# Patient Record
Sex: Male | Born: 1950 | Race: Black or African American | Hispanic: No | Marital: Married | Smoking: Former smoker
Health system: Southern US, Community
[De-identification: ages and names within clinical notes are randomized; demographics above are authoritative.]

## PROBLEM LIST (undated history)

## (undated) DIAGNOSIS — I739 Peripheral vascular disease, unspecified: Secondary | ICD-10-CM

## (undated) DIAGNOSIS — I2510 Atherosclerotic heart disease of native coronary artery without angina pectoris: Secondary | ICD-10-CM

## (undated) DIAGNOSIS — I10 Essential (primary) hypertension: Secondary | ICD-10-CM

## (undated) DIAGNOSIS — C61 Malignant neoplasm of prostate: Secondary | ICD-10-CM

## (undated) DIAGNOSIS — R3912 Poor urinary stream: Secondary | ICD-10-CM

## (undated) DIAGNOSIS — R0602 Shortness of breath: Secondary | ICD-10-CM

## (undated) DIAGNOSIS — Z951 Presence of aortocoronary bypass graft: Secondary | ICD-10-CM

## (undated) DIAGNOSIS — Z808 Family history of malignant neoplasm of other organs or systems: Secondary | ICD-10-CM

## (undated) DIAGNOSIS — N401 Enlarged prostate with lower urinary tract symptoms: Secondary | ICD-10-CM

## (undated) DIAGNOSIS — Z95820 Peripheral vascular angioplasty status with implants and grafts: Secondary | ICD-10-CM

## (undated) DIAGNOSIS — I639 Cerebral infarction, unspecified: Secondary | ICD-10-CM

## (undated) DIAGNOSIS — Z973 Presence of spectacles and contact lenses: Secondary | ICD-10-CM

## (undated) DIAGNOSIS — E119 Type 2 diabetes mellitus without complications: Secondary | ICD-10-CM

## (undated) DIAGNOSIS — Z803 Family history of malignant neoplasm of breast: Secondary | ICD-10-CM

## (undated) DIAGNOSIS — J449 Chronic obstructive pulmonary disease, unspecified: Secondary | ICD-10-CM

## (undated) DIAGNOSIS — E785 Hyperlipidemia, unspecified: Secondary | ICD-10-CM

## (undated) DIAGNOSIS — I779 Disorder of arteries and arterioles, unspecified: Secondary | ICD-10-CM

## (undated) DIAGNOSIS — M1990 Unspecified osteoarthritis, unspecified site: Secondary | ICD-10-CM

## (undated) DIAGNOSIS — K219 Gastro-esophageal reflux disease without esophagitis: Secondary | ICD-10-CM

## (undated) HISTORY — DX: Family history of malignant neoplasm of breast: Z80.3

## (undated) HISTORY — PX: COLONOSCOPY: SHX174

## (undated) HISTORY — PX: CARDIOVASCULAR STRESS TEST: SHX262

## (undated) HISTORY — PX: ENDOVASCULAR STENT INSERTION: SHX5161

## (undated) HISTORY — DX: Peripheral vascular disease, unspecified: I73.9

## (undated) HISTORY — DX: Disorder of arteries and arterioles, unspecified: I77.9

## (undated) HISTORY — PX: CAROTID ENDARTERECTOMY: SUR193

## (undated) HISTORY — PX: CARDIAC CATHETERIZATION: SHX172

## (undated) HISTORY — DX: Atherosclerotic heart disease of native coronary artery without angina pectoris: I25.10

## (undated) HISTORY — DX: Family history of malignant neoplasm of other organs or systems: Z80.8

## (undated) HISTORY — DX: Essential (primary) hypertension: I10

---

## 2000-12-01 ENCOUNTER — Encounter: Payer: Self-pay | Admitting: Cardiovascular Disease

## 2000-12-01 ENCOUNTER — Ambulatory Visit (HOSPITAL_COMMUNITY): Admission: RE | Admit: 2000-12-01 | Discharge: 2000-12-01 | Payer: Self-pay | Admitting: Cardiovascular Disease

## 2000-12-25 ENCOUNTER — Ambulatory Visit (HOSPITAL_COMMUNITY): Admission: RE | Admit: 2000-12-25 | Discharge: 2000-12-26 | Payer: Self-pay | Admitting: Cardiovascular Disease

## 2001-03-02 ENCOUNTER — Ambulatory Visit (HOSPITAL_COMMUNITY): Admission: RE | Admit: 2001-03-02 | Discharge: 2001-03-03 | Payer: Self-pay | Admitting: Cardiovascular Disease

## 2001-09-24 ENCOUNTER — Ambulatory Visit (HOSPITAL_COMMUNITY): Admission: RE | Admit: 2001-09-24 | Discharge: 2001-09-24 | Payer: Self-pay | Admitting: Gastroenterology

## 2001-11-10 ENCOUNTER — Encounter: Payer: Self-pay | Admitting: Cardiovascular Disease

## 2001-11-10 ENCOUNTER — Ambulatory Visit (HOSPITAL_COMMUNITY): Admission: RE | Admit: 2001-11-10 | Discharge: 2001-11-11 | Payer: Self-pay | Admitting: Cardiovascular Disease

## 2003-04-30 ENCOUNTER — Emergency Department (HOSPITAL_COMMUNITY): Admission: EM | Admit: 2003-04-30 | Discharge: 2003-04-30 | Payer: Self-pay | Admitting: Emergency Medicine

## 2003-08-25 ENCOUNTER — Encounter: Admission: RE | Admit: 2003-08-25 | Discharge: 2003-08-25 | Payer: Self-pay | Admitting: Cardiovascular Disease

## 2003-08-30 ENCOUNTER — Ambulatory Visit (HOSPITAL_COMMUNITY): Admission: RE | Admit: 2003-08-30 | Discharge: 2003-08-31 | Payer: Self-pay | Admitting: Cardiovascular Disease

## 2009-02-22 ENCOUNTER — Ambulatory Visit: Payer: Self-pay | Admitting: Cardiology

## 2009-02-22 ENCOUNTER — Inpatient Hospital Stay (HOSPITAL_COMMUNITY): Admission: EM | Admit: 2009-02-22 | Discharge: 2009-02-24 | Payer: Self-pay | Admitting: Emergency Medicine

## 2009-02-22 DIAGNOSIS — I639 Cerebral infarction, unspecified: Secondary | ICD-10-CM

## 2009-02-22 HISTORY — DX: Cerebral infarction, unspecified: I63.9

## 2009-02-23 ENCOUNTER — Ambulatory Visit: Payer: Self-pay | Admitting: Vascular Surgery

## 2009-02-24 ENCOUNTER — Ambulatory Visit: Payer: Self-pay | Admitting: Vascular Surgery

## 2009-02-24 ENCOUNTER — Encounter (INDEPENDENT_AMBULATORY_CARE_PROVIDER_SITE_OTHER): Payer: Self-pay | Admitting: Emergency Medicine

## 2009-02-24 ENCOUNTER — Encounter: Payer: Self-pay | Admitting: Vascular Surgery

## 2009-02-28 ENCOUNTER — Inpatient Hospital Stay (HOSPITAL_COMMUNITY): Admission: AD | Admit: 2009-02-28 | Discharge: 2009-03-02 | Payer: Self-pay | Admitting: Vascular Surgery

## 2009-02-28 ENCOUNTER — Encounter: Payer: Self-pay | Admitting: Vascular Surgery

## 2009-03-23 ENCOUNTER — Ambulatory Visit: Payer: Self-pay | Admitting: Vascular Surgery

## 2009-11-02 ENCOUNTER — Ambulatory Visit: Payer: Self-pay | Admitting: Vascular Surgery

## 2010-05-24 ENCOUNTER — Ambulatory Visit: Admit: 2010-05-24 | Payer: Self-pay | Admitting: Vascular Surgery

## 2010-05-24 ENCOUNTER — Other Ambulatory Visit (INDEPENDENT_AMBULATORY_CARE_PROVIDER_SITE_OTHER): Payer: PRIVATE HEALTH INSURANCE

## 2010-05-24 DIAGNOSIS — I6529 Occlusion and stenosis of unspecified carotid artery: Secondary | ICD-10-CM

## 2010-06-01 NOTE — Procedures (Unsigned)
CAROTID DUPLEX EXAM  INDICATION:  Carotid disease.  HISTORY: Diabetes:  No Cardiac:  No Hypertension:  Yes Smoking:  Previous Previous Surgery:  Right carotid endarterectomy on 02/28/2009. CV History:  History of stroke prior to surgery. Amaurosis Fugax No, Paresthesias No, Hemiparesis No                                      RIGHT             LEFT Brachial systolic pressure:         120               124 Brachial Doppler waveforms:         normal            normal Vertebral direction of flow:        antegrade         antegrade DUPLEX VELOCITIES (cm/sec) CCA peak systolic                   113               94 ECA peak systolic                   107               330 ICA peak systolic                   P=80/M=137        263 ICA end diastolic                   P=21/M=39         56 PLAQUE MORPHOLOGY:                                    heterogeneous PLAQUE AMOUNT:                      none              moderate PLAQUE LOCATION:                                      ICA/ECA/distal CCA  IMPRESSION: 1. Patent right carotid endarterectomy site with no evidence of     stenosis.  Elevated velocities noted in the right mid internal     carotid artery with no plaque formation noted. 2. Doppler velocities suggest high-end 40% to 59% stenosis of the left     proximal internal carotid artery. 3. Left external carotid artery stenosis. 4. Doppler velocities of the left internal carotid artery appear less     than previously reported when compared to the previous exam on     11/02/2009 with the right internal carotid artery remaining stable.  ___________________________________________ Di Kindle. Edilia Bo, M.D.  CH/MEDQ  D:  05/24/2010  T:  05/24/2010  Job:  [id number]

## 2010-07-25 LAB — URINALYSIS, ROUTINE W REFLEX MICROSCOPIC
Bilirubin Urine: NEGATIVE
Hgb urine dipstick: NEGATIVE
Ketones, ur: NEGATIVE mg/dL
Ketones, ur: NEGATIVE mg/dL
Nitrite: NEGATIVE
Nitrite: NEGATIVE
Protein, ur: NEGATIVE mg/dL
Specific Gravity, Urine: 1.011 (ref 1.005–1.030)
Specific Gravity, Urine: 1.022 (ref 1.005–1.030)
Urobilinogen, UA: 1 mg/dL (ref 0.0–1.0)
pH: 6.5 (ref 5.0–8.0)

## 2010-07-25 LAB — TROPONIN I
Troponin I: 0.01 ng/mL (ref 0.00–0.06)
Troponin I: 0.01 ng/mL (ref 0.00–0.06)

## 2010-07-25 LAB — COMPREHENSIVE METABOLIC PANEL
ALT: 17 U/L (ref 0–53)
ALT: 18 U/L (ref 0–53)
AST: 26 U/L (ref 0–37)
Albumin: 3.7 g/dL (ref 3.5–5.2)
Alkaline Phosphatase: 39 U/L (ref 39–117)
Alkaline Phosphatase: 46 U/L (ref 39–117)
BUN: 9 mg/dL (ref 6–23)
CO2: 29 mEq/L (ref 19–32)
Calcium: 8.7 mg/dL (ref 8.4–10.5)
Calcium: 9 mg/dL (ref 8.4–10.5)
Chloride: 104 mEq/L (ref 96–112)
Creatinine, Ser: 0.94 mg/dL (ref 0.4–1.5)
Creatinine, Ser: 1.02 mg/dL (ref 0.4–1.5)
GFR calc Af Amer: >60 mL/min (ref >60)
GFR calc non Af Amer: >60 mL/min (ref >60)
Glucose, Bld: 111 mg/dL — ABNORMAL HIGH (ref 70–99)
Glucose, Bld: 124 mg/dL — ABNORMAL HIGH (ref 70–99)
Glucose, Bld: 130 mg/dL — ABNORMAL HIGH (ref 70–99)
Potassium: 3.7 mEq/L (ref 3.5–5.1)
Potassium: 4.9 mEq/L (ref 3.5–5.1)
Sodium: 135 mEq/L (ref 135–145)
Sodium: 139 mEq/L (ref 135–145)
Total Bilirubin: 0.6 mg/dL (ref 0.3–1.2)
Total Protein: 6 g/dL (ref 6.0–8.3)
Total Protein: 6.8 g/dL (ref 6.0–8.3)

## 2010-07-25 LAB — DIFFERENTIAL
Basophils Absolute: 0 10*3/uL (ref 0.0–0.1)
Eosinophils Absolute: 0.3 10*3/uL (ref 0.0–0.7)
Eosinophils Relative: 5 % (ref 0–5)
Lymphocytes Relative: 29 % (ref 12–46)
Lymphocytes Relative: 34 % (ref 12–46)
Lymphs Abs: 2.1 10*3/uL (ref 0.7–4.0)
Lymphs Abs: 2.2 10*3/uL (ref 0.7–4.0)
Monocytes Absolute: 0.6 10*3/uL (ref 0.1–1.0)
Monocytes Relative: 10 % (ref 3–12)
Monocytes Relative: 9 % (ref 3–12)
Neutro Abs: 3.2 10*3/uL (ref 1.7–7.7)
Neutrophils Relative %: 56 % (ref 43–77)

## 2010-07-25 LAB — PROTIME-INR
INR: 0.89 (ref 0.00–1.49)
INR: 0.97 (ref 0.00–1.49)
INR: 1.04 (ref 0.00–1.49)
Prothrombin Time: 12.8 seconds (ref 11.6–15.2)

## 2010-07-25 LAB — CBC
HCT: 35.8 % — ABNORMAL LOW (ref 39.0–52.0)
HCT: 39.7 % (ref 39.0–52.0)
HCT: 41.6 % (ref 39.0–52.0)
Hemoglobin: 13.1 g/dL (ref 13.0–17.0)
Hemoglobin: 14 g/dL (ref 13.0–17.0)
MCV: 91.9 fL (ref 78.0–100.0)
MCV: 92.4 fL (ref 78.0–100.0)
Platelets: 164 10*3/uL (ref 150–400)
RBC: 4.31 MIL/uL (ref 4.22–5.81)
RBC: 4.4 MIL/uL (ref 4.22–5.81)
RDW: 12.8 % (ref 11.5–15.5)
WBC: 4.5 10*3/uL (ref 4.0–10.5)
WBC: 6.2 10*3/uL (ref 4.0–10.5)
WBC: 7.5 10*3/uL (ref 4.0–10.5)

## 2010-07-25 LAB — CK TOTAL AND CKMB (NOT AT ARMC)
CK, MB: 1.9 ng/mL (ref 0.3–4.0)
CK, MB: 2.9 ng/mL (ref 0.3–4.0)
Relative Index: 1.5 (ref 0.0–2.5)
Total CK: 128 U/L (ref 7–232)
Total CK: 155 U/L (ref 7–232)

## 2010-07-25 LAB — TYPE AND SCREEN: ABO/RH(D): A POS

## 2010-07-25 LAB — RAPID URINE DRUG SCREEN, HOSP PERFORMED
Amphetamines: NOT DETECTED
Opiates: POSITIVE — AB
Tetrahydrocannabinol: POSITIVE — AB

## 2010-07-25 LAB — GLUCOSE, CAPILLARY: Glucose-Capillary: 116 mg/dL — ABNORMAL HIGH (ref 70–99)

## 2010-07-25 LAB — BASIC METABOLIC PANEL
BUN: 5 mg/dL — ABNORMAL LOW (ref 6–23)
Creatinine, Ser: 0.83 mg/dL (ref 0.4–1.5)
GFR calc non Af Amer: >60 mL/min (ref >60)
Glucose, Bld: 146 mg/dL — ABNORMAL HIGH (ref 70–99)
Potassium: 4.1 mEq/L (ref 3.5–5.1)

## 2010-07-25 LAB — LIPID PANEL: VLDL: 20 mg/dL (ref 0–40)

## 2010-07-25 LAB — HEMOGLOBIN A1C
Hgb A1c MFr Bld: 5.8 % (ref 4.6–6.1)
Mean Plasma Glucose: 120 mg/dL

## 2010-07-25 LAB — APTT
aPTT: 30 seconds (ref 24–37)
aPTT: 30 seconds (ref 24–37)

## 2010-09-04 NOTE — Assessment & Plan Note (Signed)
OFFICE VISIT   Michael Maddox, Michael Maddox  DOB:  March 13, 1951                                       03/23/2009  CHART#:[id number]   I saw the patient in the office today for followup after his recent  right carotid endarterectomy.  This is a 60 year old gentleman who had  presented with a sudden onset of paresthesias in his left upper  extremity and had a right hemispheric stroke.  He had bilateral carotid  disease and right carotid endarterectomy was recommended in order to  lower his risk of future stroke.  He underwent right carotid  endarterectomy with Dacron patch angioplasty on 02/28/2009.  Of note, he  had a very high bifurcation and a very small internal carotid artery.  He did well postoperatively and returns for his first postoperative  visit.  He has had no focal weakness or paresthesias.  He has had no  problems swallowing or breathing.  He remains on his aspirin and he is  also in a study comparing placebo versus Plavix.   PHYSICAL EXAMINATION:  Vital signs:  On examination blood pressure is  158/74, heart rate is 56, temperature is 97.8.  Neck:  His right neck  incision is healing nicely.  He has a left carotid bruit.  Lungs:  Are  clear bilaterally to auscultation.  He has good strength in his upper  extremities and lower extremities bilaterally with no focal weakness or  paresthesias.  He had a greater than 80% left carotid stenosis based on  his preoperative duplex and I repeated his carotid duplex on the left  given that with now fixed right side where he had a critical stenosis  and the velocities on the left have now improved.  He is now in the 60%-  79% range on the left with an end diastolic velocity on the left of 84  cm/sec.   Given that the stenosis on the left appears to be less than 80% I have  simply recommended a followup duplex scan in 6 months and I will see him  back at that time.  If he remains asymptomatic we would not consider  left  carotid endarterectomy unless the stenosis progressed to greater  than 80%.  I will see him back in 6 months.  He knows to call sooner if  he has problems.  In the meantime he knows to continue taking his  aspirin.   Di Kindle. Edilia Bo, M.D.  Electronically Signed   CSD/MEDQ  D:  03/23/2009  T:  03/24/2009  Job:  [id number]   cc:   Pramod P. Pearlean Brownie, MD

## 2010-09-04 NOTE — Procedures (Signed)
CAROTID DUPLEX EXAM   INDICATION:  Status post right carotid endarterectomy, left carotid  disease.   HISTORY:  Diabetes:  No.  Cardiac:  No.  Hypertension:  Yes.  Smoking:  Right carotid endarterectomy in 02/2009.  Previous Surgery:  CV History:  Currently asymptomatic.  Amaurosis Fugax No, Paresthesias No, Hemiparesis No                                       RIGHT             LEFT  Brachial systolic pressure:  Brachial Doppler waveforms:  Vertebral direction of flow:                          Antegrade  DUPLEX VELOCITIES (cm/sec)  CCA peak systolic                                     M=75, terminal  CCA=424/139  ECA peak systolic                                     149  ICA peak systolic                                     407  ICA end diastolic                                     84  PLAQUE MORPHOLOGY:                                    Heterogeneous  PLAQUE AMOUNT:                                        Moderate / severe  PLAQUE LOCATION:                                      Terminal CCA   IMPRESSION:  Doppler velocities suggest a high end 60%-79% stenosis of  the left proximal internal carotid artery, however, there is no  significant plaque formation noted. This increase in velocities is most  likely a result of the focal plaque formation and significantly  increased velocities of the left terminal common carotid artery.   ___________________________________________  Di Kindle. Edilia Bo, M.D.   CH/MEDQ  D:  03/23/2009  T:  03/23/2009  Job:  [id number]

## 2010-09-04 NOTE — Assessment & Plan Note (Signed)
OFFICE VISIT   JAKHI, DISHMAN  DOB:  [birth year]                                       11/02/2009  CHART#:[id number]   I saw the patient in the office today for followup after his previous  right carotid endarterectomy.  This is a pleasant 60 year old gentleman  who presented with a right hemispheric stroke.  He was found to have a  greater than 80% right carotid stenosis and underwent right carotid  endarterectomy with Dacron patch angioplasty on 02/28/2009.  He comes in  for a routine followup visit.  Since I saw him last he has had no  history of stroke, TIAs, expressive or receptive aphasia or amaurosis  fugax.   His past medical history is significant for hypertension and  hypercholesterolemia, both of which have been stable on his current  medications.  He denies any history of diabetes, history of previous  myocardial infarction or history of congestive heart failure.   SOCIAL HISTORY:  He is married.  He has three children.  He quit tobacco  in November of 2010.   REVIEW OF SYSTEMS:  CARDIOVASCULAR:  He has had no chest pain, chest  pressure, palpitations or arrhythmias.  He does admit to dyspnea on  exertion.  He has some claudication bilaterally.  He has had no history  of DVT.  PULMONARY:  He has had no productive cough, bronchitis, asthma or  wheezing.   PHYSICAL EXAMINATION:  General:  This is a pleasant 60 year old  gentleman who appears his stated age.  Vital signs:  Blood pressure is  137/69, heart rate is 50, saturation 100%.  HEENT:  Unremarkable.  Lungs:  Are clear bilaterally to auscultation.  Cardiovascular:  He has  a left carotid bruit.  He has a regular rate and rhythm.  He has  palpable femoral pulses.  Abdomen:  Soft and nontender with normal  pitched bowel sounds.  Musculoskeletal:  There are no major deformities  or cyanosis.  Neurologic:  He has no focal weakness or paresthesias.   I did independently interpret his  carotid duplex scan which shows no  evidence of recurrent carotid stenosis on the right.  He has a 60%-79%  left carotid stenosis by velocity criteria.  Both vertebral arteries are  patent with normally directed flow.   I have explained that we typically would not consider left carotid  endarterectomy unless the stenosis progressed to greater than 80% or he  developed new neurologic symptoms.  I have ordered a followup duplex  scan in 6 months.  I will plan on seeing him back in 1 year, however,  unless there has been any significant change in his carotid duplex scan.  He does know to continue taking his Plavix.  He knows to call sooner if  he has any problems.     Di Kindle. Edilia Bo, M.D.  Electronically Signed   CSD/MEDQ  D:  11/02/2009  T:  11/03/2009  Job:  [id number]   cc:   Pramod P. Pearlean Brownie, MD  Fleet Contras, M.D.

## 2010-09-04 NOTE — Procedures (Signed)
CAROTID DUPLEX EXAM   INDICATION:  Follow up carotid artery disease.   HISTORY:  Diabetes:  No.  Cardiac:  No.  Hypertension:  Yes.  Smoking:  Previous.  Previous Surgery:  Right CEA, 02/2009.  CV History:  Stroke prior to surgery.  Amaurosis Fugax No, Paresthesias No, Hemiparesis No.                                       RIGHT             LEFT  Brachial systolic pressure:         134               132  Brachial Doppler waveforms:         WNL               WNL  Vertebral direction of flow:        Antegrade         Antegrade  DUPLEX VELOCITIES (cm/sec)  CCA peak systolic                   105               M = 114, D =  389/84  ECA peak systolic                   114               378  ICA peak systolic                   137               349  ICA end diastolic                   45                87  PLAQUE MORPHOLOGY:                                    Heterogenous  PLAQUE AMOUNT:                      None              Moderate/severe  PLAQUE LOCATION:                                      Bifurcation   IMPRESSION:  1. Right internal carotid artery velocity is slightly elevated, status      post carotid endarterectomy; however, no plaque visualized in the      internal carotid artery.  2. Left internal carotid artery velocities are suggestive of 60% to      79% stenosis.  3. Left distal common carotid artery stenosis with velocities of      389/84 cm/s.  4. Distal common carotid artery stenosis extends into the origins of      the internal carotid artery and external carotid artery.  5. Left external carotid artery stenosis.   ___________________________________________  Di Kindle. Edilia Bo, M.D.   AS/MEDQ  D:  11/02/2009  T:  11/02/2009  Job:  [id number]

## 2010-09-07 NOTE — Procedures (Signed)
[place]. [facility]  Patient:    Michael Maddox, Michael Maddox Visit Number: [id number] MRN: [id number]          Service Type: END Location: ENDO Attending Physician:  Charna Elizabeth Dictated by:   Anselmo Rod, M.D. Proc. Date: 09/24/01 Admit Date:  09/24/2001   CC:         Lilly Cove, M.D.  Runell Gess, M.D.   Procedure Report  DATE OF BIRTH:  26-Nov-1950.  PROCEDURE:  Colonoscopy.  ENDOSCOPIST:  Anselmo Rod, M.D.  INSTRUMENT USED:  Olympus video colonoscope.  INDICATION FOR PROCEDURE:  Guaiac-positive stools in a 60 year old African-American male.  Rule out colonic polyps, masses, hemorrhoids, etc.  PREPROCEDURE PREPARATION:  Informed consent was procured from the patient. The patient was fasted for eight hours prior to the procedure and prepped with a bottle of magnesium citrate and a gallon of NuLytely the night prior to the procedure.  The patients Plavix was also discontinued prior to the procedure as recommended by Dr. Nanetta Batty.  PREPROCEDURE PHYSICAL:  VITAL SIGNS:  The patient had stable vital signs.  NECK:  Supple.  CHEST:  Clear to auscultation.  S1, S2 regular.  ABDOMEN:  Soft with normal bowel sounds.  DESCRIPTION OF PROCEDURE:  The patient was placed in the left lateral decubitus position and sedated with 50 mg of Demerol and 7 mg of Versed intravenously.  Once the patient was adequately sedate and maintained on low-flow oxygen and continuous cardiac monitoring, the Olympus video colonoscope was advanced from the rectum to the cecum without difficulty. Except for a prominent internal hemorrhoid, no other abnormalities were seen. No masses, polyps, or diverticula were present.  The procedure was complete up to the cecum.  The appendiceal orifice and the ileocecal valve were clearly visualized and photographed.  IMPRESSION: 1. Prominent internal hemorrhoids. 2. Normal colonoscopy to the  cecum.  RECOMMENDATIONS: 1. A high-fiber diet has been discussed with the patient. 2. Outpatient follow-up is advised for repeat guaiac testing and further    recommendations made thereafter. Dictated by:   Anselmo Rod, M.D. Attending Physician:  Charna Elizabeth DD:  09/24/01 TD:  09/27/01 Job: [id number]

## 2010-09-07 NOTE — Cardiovascular Report (Signed)
[place]. [facility]  Patient:    KENTAVIOUS, MICHELE                      MRN: [id number] Adm. Date:  [id number] Attending:  Berry, Jonathan [place] CC:         Cath Lab  Lilly Cove, M.D.   Cardiac Catheterization  INDICATIONS:  Mr. Mclure is a 60 year old married black male father of three, who works as a Engineer, water.  He was initially referred by Dr. Karilyn Cota for claudication.  He had vague symptoms suggestive of angina and underwent cardiac stress testing revealing inferior scar versus diaphragmatic attenuation.  He presents now for diagnostic coronary arteriography.  DESCRIPTION OF PROCEDURE:  The patient was brought to the second floor [facility] Cardiac catheterization lab in the postabsorptive state. He was premedicated with p.o. Valium and IV Versed.  He also received 1 mg of IV Atropine because of vagal-induced hypotension and bradycardia at the time of groin access.  Visopaque dye was used for the entirety of the case. Retrograde aortic, left ventricular, and pullback pressures were recorded.  A 6 French right and left Judkins diagnostic catheters as well as a 6 French pigtail catheter were used for selective coronary angiography, left ventriculography, subselective left internal mammary artery angiography, respectively.  HEMODYNAMICS:  Aortic systolic pressure 150, diastolic pressure 69.  Left ventricular systolic pressure 148, end diastolic pressure 12.  SELECTIVE CORONARY ANGIOGRAPHY:  Left main appeared normal, though, there was some damping with the 6 French left Judkins probably secondary to noncoaxial engagement.  LAD.  The LAD had a 50% segmental midlesion at the takeoff of moderate size mid first diagonal branch.  Left circumflex.  This vessel had a 70% segmental midstenosis in its midportion.  Right coronary artery.  This is a dominant vessel with 75% segmental proximal to midstenosis with a focal 80 to 85%  stenosis in the midportion.  Left ventriculography.  RAO left ventriculogram was performed using 25 cc of Omnipaque dye at 12 cc per second.  The overall LVEF was estimated at greater than 70% without focal wall motion abnormalities.  Left internal mammary artery.  This vessel was subselectively visualized and was widely patent.  It was suitable for use during coronary artery bypass grafting.  IMPRESSION:  Mr. 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  I believe medical therapy is warranted at this time until he progresses in severity.  At that time, medically treated stents may be available to prevent restenosis which is likely in the long diffuse nature of his disease versus possible coronary artery bypass grafting.  The sheaths were sewn securely in place.  The patient received 4000 units of heparin intravenously with an ACT documented at approximately 220.  He left the lab in stable condition to the peripherovascular angiographic suite where he will undergo abdominal aortography and bifemoral runoff for PV evaluation. D:  12/01/00 TD:  12/01/00 Job: [id number]

## 2010-09-07 NOTE — Cardiovascular Report (Signed)
[place]. [facility]  Patient:    Michael Maddox, Michael Maddox Visit Number: [id number] MRN: [id number]          Service Type: DSU Location: [id number] Attending Physician:  Berry, Jonathan [place] Dictated by:   Runell Gess, M.D. Proc. Date: 12/25/00 Admit Date:  12/25/2000   CC:         Sixth Floor [place] Peripheral Vascular Angiographic Suite  Lilly Cove, M.D.  [facility], [address], [place], [place] [address]                        Cardiac Catheterization  INDICATIONS:  [name] is a 60 year old black male with a history of noncritical CAD, claudication with angiographically documented PVOD.  He underwent coronary catheterization, as well as peripheral angiography on August 12th.  He had diminished ADIs and function limiting claudication.  He presents now for right SFA PTA and stenting for relief of relief of symptoms of claudication.  PROCEDURE DESCRIPTION:  The patient was brought to the sixth floor [place] Peripheral Vascular Angiographic Suite in the postabsorptive state.  He was premedicated with p.o. Valium and IV Nubain.  His left groin was prepped and shaved in the usual sterile fashion.  One percent Xylocaine was used for local anesthesia.  A short 7-French sheath was inserted into the right femoral artery using sterile Seldinger technique.  A 5-French IMA catheter was used for obtaining contralateral access.  A 7-French CrossOver [place]an sheath was then exchanged over this wire.  The patient received 3000 units of heparin intravenously. The wire was placed in the SFA using the IMA catheter for guidance and advanced across the lesion of interest.  PTA was performed using a 5 x 6 PowerFlex.  Stenting was performed using a 6 x 8 Smart and postdilatation with a 6 x 4 PowerFlex with overlapping inflations.  Final angiographic result revealed reduction of a segmental 80% stenosis to 0% residual.  The  patient tolerated the procedure well.  He did receive 1 mg of Atropine prior to vascular access because of vagal response to pain.  OVERALL IMPRESSION:  Successful right superficial femoral artery percutaneous transluminal angiography and stenting, reducing an 80% segmental mid right superficial femoral artery to 0% residual.  The ACT was measured and the sheaths were removed.  Pressure was held on the groin to achieve hemostasis.  The patient left the laboratory in stable condition.  He will be discharged home in the morning after being hydrated. He will see me back in the office in two weeks afterwhich we will schedule his left SFA intervention.  Dr. Karilyn Cota was notified of these results. Dictated by:   Runell Gess, M.D. Attending Physician:  Berry, Jonathan [place] DD:  12/25/00 TD:  12/25/00 Job: [id number]

## 2010-09-07 NOTE — Procedures (Signed)
[place]. [facility]  Patient:    DIOGO, ANNE                      MRN: [id number] Proc. Date: 12/01/00 Adm. Date:  [id number] Attending:  Berry, Jonathan [place] CC:         Peripheral Vascular Angiographic Suite  Lilly Cove, M.D.  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  He had decreased exercise ABIs.  He had an inferior _________ carotid chest testing prompting a cardiac catheterization, revealing moderate but noncritical three-vessel disease with normal LV function.  He has functionally-limiting claudication, and presents now for an abdominal aortography and a bifemoral runoff.  PROCEDURE:  Abdominal aortography and bifemoral runoff.  CARDIOLOGIST:  Runell Gess, M.D.  DESCRIPTION OF PROCEDURE:  The patient is brought to the sixth floor [facility] Peripheral Vascular Angiographic Suite in the postabsorptive state.  He was premedicated with p.o. Valium.  His right groin was prepped and shaved in the usual sterile fashion.  Xylocaine 1% was used for local anesthesia.  The existing 6-French sheath in the right femoral artery was exchanged using the standard double-garage sterile technique for a new 6-French sheath.  Visipaque dye was used for the entirety of the case. Retrograde aortic pressures were monitored during the case.  ANGIOGRAPHIC RESULTS 1. Abdominal aorta: Renal arteries normal. 2. Infrarenal abdominal aorta:  Normal.  LEFT LOWER EXTREMITY 1. A 40% segmental left common iliac artery stenosis without pullback    gradient. 2. An 80% segmental mid-left SFA with three-vessel runoff.  RIGHT LOWER EXTREMITY 1. A 40% segmental right common iliac artery stenosis without pullback    gradient. 2. An 80% mid-right SFA stenosis with  three-vessel runoff.  OVERALL IMPRESSION:  Mr. Otting has symmetric bilateral 80% segmental mid-superficial artery disease, amenable to percutaneous transluminal angioplasty and stenting.  Because of the amount of contrast used today for his combined angiographic procedures, no intervention will be performed.  The sheaths were removed and pressure was held on the groin to achieve hemostasis.  The patient left the laboratory in stable condition.  DISPOSITION:  He will be discharged home later today as an outpatient, and will see me back in the office in one to two weeks for followup, at which time a percutaneous intervention of his superficial femoral arteries will be scheduled.  Dr. Karilyn Cota was notified of these results.  The patient left the laboratory in stable condition. DD:  12/01/00 TD:  12/01/00 Job: [id number]

## 2010-09-07 NOTE — Cardiovascular Report (Signed)
[place]. [facility]  Patient:    Michael Maddox, Michael Maddox Visit Number: [id number] MRN: [id number]          Service Type: DSU Location: [phone] Attending Physician:  Berry, Jonathan [place] Dictated by:   Runell Gess, M.D. Proc. Date: 11/10/01 Admit Date:  11/10/2001 Discharge Date: 11/11/2001   CC:         Sixth Floor [place] Peripheral [facility] Angiographic Suite  [facility] [address] [place] [address]  Lilly Cove, M.D.   Cardiac Catheterization  PROCEDURE: 1. Abdominal aortogram. 2. Percutaneous transluminal angioplasty and stent procedure.  CARDIOLOGIST:  Runell Gess, M.D.  INDICATIONS:  [name] is a 60 year old African-American male with a history of moderate CAD medically treated and bilateral superficial femoral artery PTA and stenting.  He has complained of progressive left lower extremity claudication, and Dopplers suggested in-stent restenosis.  He presents now for angiography and potential intervention.  DESCRIPTION OF PROCEDURE:  The patient was brought to sixth floor [place] peripheral vascular angiographic suite in the postabsorptive state.  He was premedicated with p.o. Valium.  His right groin was prepped and shaved in the usual sterile fashion.  Xylocaine 1% was used for local anesthesia.  A 5-French sheath was inserted into the right femoral artery using standard Seldinger technique.  A 5-French tennis racquet catheter, IMA catheters were used for mid stream and distal abdominal aortography with bifemoral runoff. Visipaque dye was used for the entirety of the case.  Retrograde aortic pressures were monitored throughout the case.  ANGIOGRAPHIC RESULTS: 1. Abdominal aorta    a. Renal: Normal.    b. Infrarenal abdominal aorta: Normal. 2. Left lower extremity.    a. 60% distal left common iliac artery stenosis with 35 mm pullback       gradient.    b. 90% "in-stent  restenosis" within the left superficial femoral artery       stent with three-vessel runoff. 3. Right lower extremity: A 50% "in-stent restenosis" within the right    superficial femoral artery stent with three-vessel runoff.  DESCRIPTION OF PROCEDURE: Control access was obtained with a 5-French IMA catheter and 0.035 Wholey wire, 7-French crossover block and sheath.  The patient received 2500 units of heparin intravenous.  The wire was passed across the superficial femoral artery stented segment.  PTA was performed with a 5 x 8 Powerflex up to 10 to 12 atmospheres for 90 resulting in reduction of a 90% "in-stent restenosis" to less than 20% residual.  PTA and stenting was then performed of the distal left common iliac artery using a Genesis 188 mounted on a 7.2 optimal balloon deployed primarily at 10 atmospheres resulting in reduction of 60% stenosis to 0% residual.  The patient tolerated the procedure well.  The guidewire and sheath were withdrawn.  The ACT was measured, and pressure was held to the groin to achieve hemostasis.  The patient left the lab in stable condition.  DISPOSITION:  The patient will be discharged home in the morning, and we will obtain followup Dopplers and ABIs after which he will see me back in the office in followup.  He left the lab in stable condition. Dictated by:   Runell Gess, M.D. Attending Physician:  Berry, Jonathan [place] DD:  11/10/01 TD:  11/14/01 Job: [id number]

## 2010-09-07 NOTE — Discharge Summary (Signed)
[place]. [facility]  Patient:    Michael Maddox, Michael Maddox Visit Number: [id number] MRN: [id number]          Service Type: DSU Location: [phone] Attending Physician:  Berry, Jonathan [place]az[place] Dictated by:   Halford Decamp Delanna Ahmadi, R.N., N.P. Admit Date:  03/02/2001 Discharge Date: 03/03/2001   CC:         Lilly Cove, M.D.   Discharge Summary  HISTORY OF PRESENT ILLNESS:  Michael Maddox is a 60 year old African-American male patient of Runell Gess, M.D., who came into the hospital for PV angiogram secondary to known right SFA disease for PTA and stenting. He underwent this procedure on March 02, 2001, by Dr. Allyson Sabal.  He had an 80% segmental mid SFA with three vessel runoff.  It was reduced to 0% with PTCA and stenting.  His right lower extremity SFA stent previously placed was widely patent. The morning of March 03, 2001, he was seen by Dr. Allyson Sabal.  His right groin was without bruise or hematoma.  He was considered stable to be discharged home.  LABS:  March 02, 2001, his hemoglobin was 13.5, hematocrit 41.5. March 03, 2001, hemoglobin 13.2, hematocrit 39, his platelet count was 217, wbc 13.7.  March 02, 2001, his glucose was 112, his sodium was 140, his potassium was 4.3.  On March 03, 2001, his glucose was 184, sodium 137, potassium 4.3, BUN was 13, creatinine 1.2, his calcium was 9.  Chest x-ray done December 01, 2000, was no active disease.  ASSESSMENT: 1. Atherosclerotic peripheral vascular disease positive claudication symptoms.    Status post percutaneous transluminal coronary angioplasty and stenting of    right superficial femoral artery December 25, 2000; this admission status    post percutaneous transluminal angioplasty and stenting of left mid    superficial femoral artery on March 02, 2001. 2. Carotid stenosis with left common carotid with soft plaque and proximal    internal carotid artery with 50% diameter  reduction. 3. Arteriosclerotic cardiovascular disease with cardiac catheterization    December 01, 2000, with 50% mid left anterior descending, circumflex 70%    mid, right coronary artery 70% proximal and 80 to 85% stenosis in the mid. 4. Normal ejection fraction. 5. Hyperglycemia, needs to be reevaluated as an outpatient. 6. Hyperlipidemia, being currently treated with Advicor. 7. Tobacco use. The patient is in the process of quitting.  He is down to    cigarettes per day.  PLAN:  Because of his hyperglycemia, we will recheck a fasting BMET as an outpatient; also, we will recheck a fasting lipid profile and LFTs as an outpatient.  He was started on his Advicor in September.  If he does have hyperglycemia, it may not be beneficial for him to be on Niacin at this time. We may also want to consider an ACE inhibitor because of his peripheral vascular and coronary disease.  We may also want to consider an MR because of his component disease in the future.  DISCHARGE MEDICATIONS: 1. Plavix 75 mg once per day, continuous forever. 2. Advicor 520 mg once per day. 3. Enteric coated aspirin 81 mg once per day. 4. Toprol XL 25 mg once per day. 5. He was given a prescription for nitroglycerin 0.3 mg sublingual to use    for chest pain one every five minutes x 3.  ACTIVITY:  No strenuous activity, no driving, no lifting x 3 days.  DIET:  He should be on a low fat, low saturated  fat diet.  DISCHARGE INSTRUCTIONS:  If he has any problems with his groin, he will call.  FOLLOW-UP:  He has an appointment for lower extremity Dopplers on March 16, 2001, at 5 p.m.  He will have follow-up with Dr. Allyson Sabal. The office will call for appointment after March 16, 2001.  He will have his labs drawn one week before seeing Dr. Allyson Sabal. Dictated by:   Halford Decamp Delanna Ahmadi, R.N., N.P. Attending Physician:  Berry, Jonathan [place] DD:  03/03/01 TD:  03/03/01 Job: [id number]

## 2010-09-07 NOTE — Cardiovascular Report (Signed)
[place]. [facility]  Patient:    Michael Maddox, Michael Maddox Visit Number: [id number] MRN: [id number]          Service Type: DSU Location: [phone] Attending Physician:  Berry, Jonathan [place] Dictated by:   Runell Gess, M.D. Proc. Date: 03/02/01 Admit Date:  03/02/2001   CC:         Peripheral Vascular Angiographic Suite  Lilly Cove, M.D.  [facility], [address], [place], [place] [address]   Cardiac Catheterization  PROCEDURES PERFORMED: Angiogram/percutaneous transluminal coronary angioplasty and stent.  INDICATIONS: The patient is a 60 year old patient of Drs. Allyson Sabal and [employer]. He has angiographic evidence of moderate to noncritical CAD as well as PVOD. He has had right SFA PTA and stenting December 25, 2000. He complains of claudication. He presents now for left SFA intervention.  DESCRIPTION OF PROCEDURE: The patient was brought to the 6th floor [place] [facility] V[facility] Angiographic Suite in the postabsorptive state.  He was premedicated with p.o. Valium.  His right groin was prepped and shaved in the usual sterile fashion.  Xylocaine 1% was used for local anesthesia.  A 7 French sheath was inserted into the right femoral artery using standard Seldinger technique. A 5 [place] Tennis Racquet catheter, IMA catheters were used for mid stream distal abdominal aortography as well as bifemoral runoff. Visipaque dye was used for the entirety of the case. Retrograde and aortic pressures were monitored during the case.  ANGIOGRAPHIC RESULTS: 1. Abdominal aorta:    a. Renal arteries: Normal.    b. Infrarenal abdominal aorta: Normal. 2. Left lower extremity:    a. A 20-30% segmental left common iliac artery stenosis.    b. An 80% long segmental mid left SFA.    c. Two-vessel runoff with an occluded anterior tibial. 3. Patent mid right SFA stent with two-vessel runoff.  DESCRIPTION OF PROCEDURE: Contralateral  access was obtained with a 7 [place] crossover Balkan sheath and an 0.035 Wholey wire. The patient received 2500 units of heparin intravenously. PTA was performed with a 4 x 8 PowerFlex stenting with a 6 x 9 Smart with post-dilation with a 5 x 8 PowerFlex resulting in reduction of a long segmental mid 80% left SFA stenosis with 0% residual. The patient tolerated the procedure well. The ACT was measured and the sheath was removed. Pressure was held on the groin to achieve hemostasis. The patient left the lab in stable condition. He will be hydrated and discharged home in the morning on aspirin and Plavix, and we will obtain outpatient ABIs after which he will see me back in the office.  He left the lab in stable condition. Dictated by:   Runell Gess, M.D. Attending Physician:  Berry, Jonathan [place] DD:  03/02/01 TD:  03/03/01 Job: [id number]

## 2010-09-07 NOTE — Cardiovascular Report (Signed)
NAME:  Michael Maddox, Michael Maddox NO.:  [id number]   MEDICAL RECORD NO.:  [id number]                   PATIENT TYPE:  OIB   LOCATION:  6531                                 FACILITY:  [facility]   PHYSICIAN:  Nanetta Batty, M.D.                DATE OF BIRTH:  03/04/1951   DATE OF PROCEDURE:  08/30/2003  DATE OF DISCHARGE:  08/31/2003                              CARDIAC CATHETERIZATION   INDICATIONS:  Michael Maddox is a 60 year old African-American male status post  bilateral SFA, PTA and stenting as well as left common iliac artery PTA and  stenting.  He has hypertension and hyperlipidemia. He is complaining of  right lower extremity claudication.  Dopplers revealed the possibility of an  occluded right SFA stent.  He presents now for angiography and potential  intervention.   PROCEDURE DESCRIPTION:  The patient was brought to the sixth floor [facility] Angiographic Suite in the postabsorptive state.  He  was premedicated with p.o. Valium, IV Benadryl and Versed.  His left groin  was prepped and shaved in the usual sterile fashion.  1% Xylocaine was used  for local anesthesia.  A 5 French sheath was inserted into the left femoral  artery using standard Seldinger technique. A 5 [place] tennis racquet  catheter and crossover catheter were used for midstream and distal abdominal  aortography as well as bifemoral runoff.  Visipaque dye was used for the  entirety of the case. Retrograde aortic pressure was monitored during the  case.   ANGIOGRAPHIC RESULTS:  1. Abdominal aorta.     A. Renal artery normal.     B. Infrarenal abdominal aorta normal.  2. Left lower extremity.     A. 60% left common iliac artery in-stent restenosis.     B. 60% left SFA in-stent restenosis.     C. Three vessel runoff.  3. Right lower extremity.     A. Occluded mid right SFA Smart self-expanding stent with 75-80%        segmental disease proximal and distal to the stent  with three vessel        runoff.   PROCEDURE DESCRIPTION:  Dr. Jacinto Halim assisted during the case.  The patient  received a total of 3500 units of heparin intravenously.  Contralateral  access was obtained with an 0.25 Wholey wire and a 5 [place] short crossover  catheter.  An end-hole catheter was then positioned at the proximal edge of  the stent and an angled glide wire was used to cross the occluded segment  into the distal SFA/popliteal.  Following this, PTA was performed within the  stented segment with a 4 x 4 PowerFlex upgrading to a 5 x 10 PowerFlex.  This established antegrade flow and demonstrated both proximal and distal  disease.  Because of this, 6 x 6 Smart stents were deployed at the distal  and proximal edges of the occluded  stent and post dilated with a 5 x 10  PowerFlex.  The previously stented segment was then post dilated at high  atmospheres with a 6 x 6 PowerFlex resulting in reduction of 75-80% proximal  and distal segmental disease to the previously placed stent to 0% residual  and an occluded mid right SFA stent to less than 40% residual with excellent  flow.  The patient tolerated the procedure well.  An ACT was measured and  the sheaths were removed.  Pressure was held to the groin to achieve  hemostasis.  The patient left the lab in stable condition.   PLAN:  Treat him with aspirin and Plavix.  He will be discharged home in the  morning and obtain lower extremity Dopplers and ABIs.  After which time, I  will see him back in the office in followup.  If he develops in-stent  restenosis within the previously placed mid right SFA stent, he would be  candidate for fox hollow pullback atherectomy.  He left the lab in stable  condition.                                               Nanetta Batty, M.D.    Cordelia Pen  D:  08/30/2003  T:  08/31/2003  Job:  [id number]   cc:   [facility]  [address]  [place], [place] [address]   Wilson Singer, M.D.  [phone]. [address]  [place]  [place] [address]  Fax: [phone]

## 2010-09-07 NOTE — Discharge Summary (Signed)
NAME:  Michael Maddox, Michael Maddox                         ACCOUNT NO.:  [id number]   MEDICAL RECORD NO.:  [id number]                   PATIENT TYPE:  OIB   LOCATION:  6531                                 FACILITY:  [facility]   PHYSICIAN:  Nanetta Batty, M.D.                DATE OF BIRTH:  1950/06/18   DATE OF ADMISSION:  08/30/2003  DATE OF DISCHARGE:  08/31/2003                                 DISCHARGE SUMMARY   DISCHARGE DIAGNOSES:  1. Peripheral vascular disease, status post intervention this admission.  2. Known coronary artery disease with angioplasties.  3. Hypertension, controlled.  4. Hyperlipidemia.   This is a 60 year old African American gentleman who has known peripheral  vascular disease and known coronary artery disease.  In the past, he  underwent bilateral superficial femoral artery angioplasties and stenting,  and during last of his visit complained of progressive lower extremity  claudication that was suggestive of in-stent restenosis, and the patient was  brought to the [facility] for elective angiogram of the lower  extremities and possible procedure.   HOSPITAL PROCEDURE:  Peripheral vascular angiogram was performed, on Aug 30, 2003, by Dr. Allyson Sabal, revealing normal abdominal aorta, and renal arteries, as  well as intrarenal abdominal aorta.  Lower extremities showed a 60% left  common iliac artery stenosis, a 60% stenosis of the left superficial femoral  artery which was in-stent restenosis, and high grade lesion of the right  superficial femoral artery, and there was also in-stent restenosis.  Procedure was performed on the right SFA with a reduction of in-stent  restenosis from 100 to less than 40%, and the patient tolerated the  procedure well.  It was carried out with no complications.   The patient was transferred to the post-cath unit in stable condition, but  later he developed some mild bleeding and pressure was reapplied.   This morning the patient was  free of any signs of bleeding.  The groin site  was stable without any obvious hematoma, or oozing, or bleeding.  He  ambulated down the ward with no difficulties.   HOSPITAL LABS:  Hemoglobin 13.3 and hematocrit 39.7, white blood cell count  13.2, and platelet count 235 on the day of discharge.  Sodium 137, potassium  5.0, BUN 13, creatinine 1.3, CO2 28, chloride 104, glucose level 138.   DISCHARGE MEDICATIONS:  1. Aspirin 81 mg every day.  2. Plavix 75 mg every day.  3. Advicor 20/1000 mg every day.  4. Toprol XL 25 mg every day.  5. Altace 5 mg every day.   DISCHARGE INSTRUCTIONS:   ACTIVITY:  Avoid driving, lifting greater than 5 pounds, strenuous activity  for five days.   DIET:  Low cholesterol diet was recommended.   The patient was allowed to take a shower.   Instructed to call our office in case of bleeding, swelling, or increased  pain.   DISCHARGE FOLLOWUP:  On September 21, 2003 at 10:30 a.m., the patient will see Dr.  Allyson Sabal.  Prior to that appointment, on May 19th at 8 a.m., he will have lower  extremity arterial ultrasound.      Raymon Mutton, P.A.                    Nanetta Batty, M.D.    MK/MEDQ  D:  08/31/2003  T:  08/31/2003  Job:  [id number]   cc:   Nanetta Batty, M.D.  Fax: [phone]

## 2010-11-14 ENCOUNTER — Ambulatory Visit: Payer: Self-pay | Admitting: Vascular Surgery

## 2010-11-14 ENCOUNTER — Ambulatory Visit: Payer: Self-pay

## 2010-11-14 ENCOUNTER — Other Ambulatory Visit: Payer: Self-pay

## 2010-11-22 ENCOUNTER — Other Ambulatory Visit: Payer: PRIVATE HEALTH INSURANCE

## 2010-11-22 ENCOUNTER — Ambulatory Visit: Payer: PRIVATE HEALTH INSURANCE

## 2012-03-02 ENCOUNTER — Encounter: Payer: Self-pay | Admitting: Vascular Surgery

## 2012-08-20 ENCOUNTER — Ambulatory Visit (INDEPENDENT_AMBULATORY_CARE_PROVIDER_SITE_OTHER): Payer: Self-pay | Admitting: Neurology

## 2012-08-20 DIAGNOSIS — I6990 Unspecified sequelae of unspecified cerebrovascular disease: Secondary | ICD-10-CM

## 2012-08-20 NOTE — Progress Notes (Signed)
This participant was in the office today for his 3rd. Annual visit for IRIS study. MMSE was completed without any problems.  Blood was drawn and shipped. Participant to continue with study visits per protocol.

## 2013-07-02 ENCOUNTER — Ambulatory Visit (INDEPENDENT_AMBULATORY_CARE_PROVIDER_SITE_OTHER): Payer: BC Managed Care – PPO | Admitting: Cardiovascular Disease

## 2013-07-02 ENCOUNTER — Encounter: Payer: Self-pay | Admitting: Cardiovascular Disease

## 2013-07-02 VITALS — BP 160/81 | HR 56 | Ht 72.0 in | Wt 161.5 lb

## 2013-07-02 DIAGNOSIS — R42 Dizziness and giddiness: Secondary | ICD-10-CM

## 2013-07-02 DIAGNOSIS — I739 Peripheral vascular disease, unspecified: Secondary | ICD-10-CM | POA: Insufficient documentation

## 2013-07-02 DIAGNOSIS — R002 Palpitations: Secondary | ICD-10-CM

## 2013-07-02 DIAGNOSIS — E785 Hyperlipidemia, unspecified: Secondary | ICD-10-CM | POA: Insufficient documentation

## 2013-07-02 DIAGNOSIS — I10 Essential (primary) hypertension: Secondary | ICD-10-CM | POA: Insufficient documentation

## 2013-07-02 DIAGNOSIS — I779 Disorder of arteries and arterioles, unspecified: Secondary | ICD-10-CM | POA: Insufficient documentation

## 2013-07-02 DIAGNOSIS — R0989 Other specified symptoms and signs involving the circulatory and respiratory systems: Secondary | ICD-10-CM

## 2013-07-02 NOTE — Assessment & Plan Note (Signed)
His blood pressure is mildly elevated on his current medications. He says would notice higher than what it usually is when he sees his primary care physician

## 2013-07-02 NOTE — Assessment & Plan Note (Signed)
Patient has a history of motor or disease status post paresthesias in his left upper extremity back in November 2010. Duplex scan revealed a high-grade right internal carotid artery stenosis and he underwent uncomplicated right carotid endarterectomy by Dr. Gae Gallop .

## 2013-07-02 NOTE — Patient Instructions (Signed)
Your physician has requested that you have a carotid duplex. This test is an ultrasound of the carotid arteries in your neck. It looks at blood flow through these arteries that supply the brain with blood. Allow one hour for this exam. There are no restrictions or special instructions.  Your physician has requested that you have a lower extremity arterial duplex. This test is an ultrasound of the arteries in the legs or arms. It looks at arterial blood flow in the legs and arms. Allow one hour for Lower and Upper Arterial scans. There are no restrictions or special instructions.  Your physician has requested that you have an echocardiogram. Echocardiography is a painless test that uses sound waves to create images of your heart. It provides your doctor with information about the size and shape of your heart and how well your heart's chambers and valves are working. This procedure takes approximately one hour. There are no restrictions for this procedure.  Your physician has recommended that you wear an event monitor for 2 weeks. Event monitors are medical devices that record the heart's electrical activity. Doctors most often [place] these monitors to diagnose arrhythmias. Arrhythmias are problems with the speed or rhythm of the heartbeat. The monitor is a small, portable device. You can wear one while you do your normal daily activities. This is usually used to diagnose what is causing palpitations.  Your physician recommends that you schedule a follow-up appointment after you have had all your tests.

## 2013-07-02 NOTE — Assessment & Plan Note (Signed)
Patient complains of fairly recent onset palpitations. He can feel his heart beat, here in his ears. Does get somewhat dizzy but this passes spell he quickly. I'm going to get a 2-D echo and an event monitor I will see him back after that for further evaluation

## 2013-07-02 NOTE — Assessment & Plan Note (Signed)
On statin therapy followed by his PCP 

## 2013-07-02 NOTE — Assessment & Plan Note (Signed)
Status post stenting of his left common iliac artery and bilateral SFAs. The left endocrine perform a myself 08/30/03. He had three-vessel runoff bilaterally. He has not been followed in the office noninvasively since that time. He does complain of lifestyle limiting claudication.

## 2013-07-02 NOTE — Progress Notes (Signed)
07/02/2013 Michael Maddox   02-May-1950  [id number]  Primary Physician [name], MD Primary Cardiologist: Lorretta Harp MD Renae Gloss   HPI:  Mr. Furr is a 63 year old appearing married African American male for a father of 3 children, grandfather of 2 grandchildren who is a patient of Dr. Christean Grief Avebuere's he has a history of peripheral arterial disease status post left iliac and bilateral SFA intervention most recently in 2005 by myself. He discontinued tobacco abuse at that time. He also has 2 hypertension and hyperlipidemia. He had right carotid endarterectomy performed by Dr. [name] in November 2010 for high-grade disease in the setting of a stroke. He denies chest pain but does get somewhat shortness of breath which has not changed. His last functional study was performed 12 years ago and was unremarkable. The palpitations are fairly recent onset. He can "hear his heartbeat in his ears" and does feel somewhat dizzy/presyncopal.   Current Outpatient Prescriptions  Medication Sig Dispense Refill  . clopidogrel (PLAVIX) 75 MG tablet Take 75 mg by mouth daily with breakfast.      . Flaxseed, Linseed, (FLAX SEED OIL) 1000 MG CAPS Take 2 capsules by mouth daily.      . Multiple Vitamin (MULTIVITAMIN WITH MINERALS) TABS tablet Take 1 tablet by mouth daily.      . nebivolol (BYSTOLIC) 5 MG tablet Take 5 mg by mouth daily.      . niacin-simvastatin (SIMCOR) 500-20 MG 24 hr tablet Take 1 tablet by mouth at bedtime.       No current facility-administered medications for this visit.    No Known Allergies  History   Social History  . Marital Status: Married    Spouse Name: N/A    Number of Children: N/A  . Years of Education: N/A   Occupational History  . Not on file.   Social History Main Topics  . Smoking status: Former Smoker    Types: Cigarettes    Quit date: 07/03/2003  . Smokeless tobacco: Not on file  . Alcohol Use: 2.5 oz/week    5 drink(s)  per week  . Drug Use: Not on file  . Sexual Activity: Not on file   Other Topics Concern  . Not on file   Social History Narrative  . No narrative on file     Review of Systems: General: negative for chills, fever, night sweats or weight changes.  Cardiovascular: negative for chest pain, dyspnea on exertion, edema, orthopnea, palpitations, paroxysmal nocturnal dyspnea or shortness of breath Dermatological: negative for rash Respiratory: negative for cough or wheezing Urologic: negative for hematuria Abdominal: negative for nausea, vomiting, diarrhea, bright red blood per rectum, melena, or hematemesis Neurologic: negative for visual changes, syncope, or dizziness All other systems reviewed and are otherwise negative except as noted above.    Blood pressure 160/81, pulse 56, height 6' (1.829 m), weight 161 lb 8 oz (73.256 kg).  General appearance: alert and no distress Neck: no adenopathy, no JVD, supple, symmetrical, trachea midline, thyroid not enlarged, symmetric, no tenderness/mass/nodules and bilateral carotid bruits left louder than right Lungs: clear to auscultation bilaterally Heart: regular rate and rhythm, S1, S2 normal, no murmur, click, rub or gallop Abdomen: soft, non-tender; bowel sounds normal; no masses,  no organomegaly Extremities: extremities normal, atraumatic, no cyanosis or edema and 2+ femoral pulses with a loud left femoral bruit, but diminished pedal pulses  EKG sinus bradycardia at 56 without ST or T wave changes  ASSESSMENT AND PLAN:  Carotid artery disease Patient has a history of motor or disease status post paresthesias in his left upper extremity back in November 2010. Duplex scan revealed a high-grade right internal carotid artery stenosis and he underwent uncomplicated right carotid endarterectomy by Dr. Gae Gallop .  Essential hypertension His blood pressure is mildly elevated on his current medications. He says would notice higher than what  it usually is when he sees his primary care physician  Hyperlipidemia On statin therapy followed by his PCP  Peripheral arterial disease Status post stenting of his left common iliac artery and bilateral SFAs. The left endocrine perform a myself 08/30/03. He had three-vessel runoff bilaterally. He has not been followed in the office noninvasively since that time. He does complain of lifestyle limiting claudication.  Palpitations Patient complains of fairly recent onset palpitations. He can feel his heart beat, here in his ears. Does get somewhat dizzy but this passes spell he quickly. I'm going to get a 2-D echo and an event monitor I will see him back after that for further evaluation      Lorretta Harp MD [facility], [facility] 07/02/2013 12:25 PM

## 2013-07-15 ENCOUNTER — Encounter: Payer: Self-pay | Admitting: Cardiovascular Disease

## 2013-07-15 ENCOUNTER — Ambulatory Visit (HOSPITAL_BASED_OUTPATIENT_CLINIC_OR_DEPARTMENT_OTHER)
Admission: RE | Admit: 2013-07-15 | Discharge: 2013-07-15 | Disposition: A | Discharge status: Home / Self Care | Payer: BC Managed Care – PPO | Source: Ambulatory Visit | Attending: Cardiovascular Disease | Admitting: Cardiovascular Disease

## 2013-07-15 ENCOUNTER — Ambulatory Visit (HOSPITAL_COMMUNITY)
Admission: RE | Admit: 2013-07-15 | Discharge: 2013-07-15 | Disposition: A | Discharge status: Home / Self Care | Payer: BC Managed Care – PPO | Source: Ambulatory Visit | Attending: Cardiology | Admitting: Cardiology

## 2013-07-15 ENCOUNTER — Ambulatory Visit (HOSPITAL_BASED_OUTPATIENT_CLINIC_OR_DEPARTMENT_OTHER)
Admission: RE | Admit: 2013-07-15 | Discharge: 2013-07-15 | Disposition: A | Discharge status: Home / Self Care | Payer: BC Managed Care – PPO | Source: Ambulatory Visit | Attending: Cardiology | Admitting: Cardiology

## 2013-07-15 ENCOUNTER — Ambulatory Visit (INDEPENDENT_AMBULATORY_CARE_PROVIDER_SITE_OTHER): Payer: BC Managed Care – PPO | Admitting: Cardiovascular Disease

## 2013-07-15 VITALS — BP 145/74 | HR 58 | Ht 72.0 in

## 2013-07-15 DIAGNOSIS — R002 Palpitations: Secondary | ICD-10-CM | POA: Insufficient documentation

## 2013-07-15 DIAGNOSIS — I739 Peripheral vascular disease, unspecified: Secondary | ICD-10-CM

## 2013-07-15 DIAGNOSIS — Z23 Encounter for immunization: Secondary | ICD-10-CM

## 2013-07-15 DIAGNOSIS — D689 Coagulation defect, unspecified: Secondary | ICD-10-CM

## 2013-07-15 DIAGNOSIS — R0989 Other specified symptoms and signs involving the circulatory and respiratory systems: Secondary | ICD-10-CM | POA: Insufficient documentation

## 2013-07-15 DIAGNOSIS — R42 Dizziness and giddiness: Secondary | ICD-10-CM | POA: Insufficient documentation

## 2013-07-15 DIAGNOSIS — Z79899 Other long term (current) drug therapy: Secondary | ICD-10-CM

## 2013-07-15 DIAGNOSIS — Z01818 Encounter for other preprocedural examination: Secondary | ICD-10-CM

## 2013-07-15 DIAGNOSIS — I379 Nonrheumatic pulmonary valve disorder, unspecified: Secondary | ICD-10-CM

## 2013-07-15 DIAGNOSIS — R5383 Other fatigue: Secondary | ICD-10-CM

## 2013-07-15 DIAGNOSIS — R5381 Other malaise: Secondary | ICD-10-CM

## 2013-07-15 DIAGNOSIS — F17200 Nicotine dependence, unspecified, uncomplicated: Secondary | ICD-10-CM

## 2013-07-15 HISTORY — PX: TRANSTHORACIC ECHOCARDIOGRAM: SHX275

## 2013-07-15 NOTE — Patient Instructions (Signed)
Dr. [name] has ordered a peripheral angiogram to be done at [facility].  This procedure is going to look at the bloodflow in your lower extremities.  If Dr. [name] is able to open up the arteries, you will have to spend one night in the hospital.  If he is not able to open the arteries, you will be able to go home that same day.    After the procedure, you will not be allowed to drive for 3 days or push, pull, or lift anything greater than 10 lbs for one week.    You will be required to have the following tests prior to the procedure:  1. Blood work-the blood work can be done no more than 7 days prior to the procedure.  It can be done at any [facility] lab.  There is one downstairs on the first floor of this building and one in the [place] ([address])  2. Chest Xray-the chest xray order has already been placed at the [place].     *REPS: [place] [employer] [place] Left groin stick   You will need a lexiscan myoview prior to the angiogram. Lexiscan Myoview- this is a test that looks at the blood flow to your heart muscle.  It takes approximately 2 1/2 hours. Please follow instruction sheet, as given.

## 2013-07-15 NOTE — Progress Notes (Signed)
07/15/2013 Michael Maddox   May 01, 1950  [id number]  Primary Physician Michael Fendt, MD Primary Cardiologist: Michael Harp MD Michael Maddox   HPI:  Michael Maddox is a 63 year old appearing married African American male for a father of 3 children, grandfather of 2 grandchildren who is a patient of Michael Maddox he has a history of peripheral arterial disease status post left iliac and bilateral SFA intervention most recently in 2005 by myself. He discontinued tobacco abuse at that time. He also has 2 hypertension and hyperlipidemia. He had right carotid endarterectomy performed by Michael Maddox in November 2010 for high-grade disease in the setting of a stroke. He denies chest pain but does get somewhat shortness of breath which has not changed. His last functional study was performed 12 years ago and was unremarkable. The palpitations are fairly recent onset. He can "hear his heartbeat in his ears" and does feel somewhat dizzy/presyncopal. A Doppler study of his lower shimmy is revealed severe arterial insufficiency with a right ABI 0.25 the left of 0.39. He had severe iliac and SFA disease. Carotid Dopplers were performed today as well.    Current Outpatient Prescriptions  Medication Sig Dispense Refill  . clopidogrel (PLAVIX) 75 MG tablet Take 75 mg by mouth daily with breakfast.      . Flaxseed, Linseed, (FLAX SEED OIL) 1000 MG CAPS Take 2 capsules by mouth daily.      . Multiple Vitamin (MULTIVITAMIN WITH MINERALS) TABS tablet Take 1 tablet by mouth daily.      . nebivolol (BYSTOLIC) 5 MG tablet Take 5 mg by mouth daily.      . niacin-simvastatin (SIMCOR) 500-20 MG 24 hr tablet Take 1 tablet by mouth at bedtime.       No current facility-administered medications for this visit.    No Known Allergies  History   Social History  . Marital Status: Married    Spouse Name: N/A    Number of Children: N/A  . Years of Education: N/A   Occupational History  . Not  on file.   Social History Main Topics  . Smoking status: Former Smoker    Types: Cigarettes    Quit date: 07/03/2003  . Smokeless tobacco: Not on file  . Alcohol Use: 2.5 oz/week    5 drink(s) per week  . Drug Use: Not on file  . Sexual Activity: Not on file   Other Topics Concern  . Not on file   Social History Narrative  . No narrative on file     Review of Systems: General: negative for chills, fever, night sweats or weight changes.  Cardiovascular: negative for chest pain, dyspnea on exertion, edema, orthopnea, palpitations, paroxysmal nocturnal dyspnea or shortness of breath Dermatological: negative for rash Respiratory: negative for cough or wheezing Urologic: negative for hematuria Abdominal: negative for nausea, vomiting, diarrhea, bright red blood per rectum, melena, or hematemesis Neurologic: negative for visual changes, syncope, or dizziness All other systems reviewed and are otherwise negative except as noted above.    Blood pressure 145/74, pulse 58, height 6' (1.829 m).  General appearance: alert and no distress Neck: no adenopathy, no JVD, supple, symmetrical, trachea midline, thyroid not enlarged, symmetric, no tenderness/mass/nodules and left carotid artery Lungs: clear to auscultation bilaterally Heart: regular rate and rhythm, S1, S2 normal, no murmur, click, rub or gallop Extremities: extremities normal, atraumatic, no cyanosis or edema  EKG not performed today  ASSESSMENT AND PLAN:   Peripheral arterial disease The patient  had a lower shimmy Doppler study performed today which showed a right ABI of 0.25 at 11.39. He has high-grade disease in his right common and external iliac artery, short segment occlusion right SFA probably representing in-stent restenosis of mild high-frequency signal in the left external iliac artery with an occluded distal SFA. He is a significant progression of disease. He does complain of dyspnea on exertion. I'm going to order  him a blunted Myoview stress test on him to preoperatively risk stratify him and then perform angiography via the left femoral approach initially.      Michael Harp MD FACP,FACC,FAHA, Michael Maddox 07/15/2013 11:00 AM

## 2013-07-15 NOTE — Progress Notes (Signed)
Lower Extremity Arterial Duplex Completed. °Brianna L Mazza,RVT °

## 2013-07-15 NOTE — Progress Notes (Signed)
Carotid Duplex Completed. °Brianna L Mazza,RVT °

## 2013-07-15 NOTE — Progress Notes (Signed)
2D Echo Performed 07/15/2013    Michael Maddox, RCS

## 2013-07-15 NOTE — Assessment & Plan Note (Signed)
The patient had a lower shimmy Doppler study performed today which showed a right ABI of 0.25 at 11.39. He has high-grade disease in his right common and external iliac artery, short segment occlusion right SFA probably representing in-stent restenosis of mild high-frequency signal in the left external iliac artery with an occluded distal SFA. He is a significant progression of disease. He does complain of dyspnea on exertion. I'm going to order him a blunted Myoview stress test on him to preoperatively risk stratify him and then perform angiography via the left femoral approach initially.

## 2013-07-22 ENCOUNTER — Encounter: Payer: Self-pay | Admitting: *Deleted

## 2013-07-22 ENCOUNTER — Telehealth: Payer: Self-pay | Admitting: *Deleted

## 2013-07-22 DIAGNOSIS — I6529 Occlusion and stenosis of unspecified carotid artery: Secondary | ICD-10-CM

## 2013-07-22 NOTE — Telephone Encounter (Signed)
Order placed for repeat carotid dopplers in 1 year  

## 2013-07-22 NOTE — Telephone Encounter (Signed)
Message copied by Chauncy Lean on Thu Jul 22, 2013 11:23 PM ------      Message from: Lorretta Harp      Created: Wed Jul 21, 2013 11:54 PM       Moderate Carotid disease L>R. Repeat 12 months ------

## 2013-07-27 ENCOUNTER — Telehealth (HOSPITAL_COMMUNITY): Payer: Self-pay

## 2013-07-28 ENCOUNTER — Telehealth (HOSPITAL_COMMUNITY): Payer: Self-pay

## 2013-07-29 ENCOUNTER — Ambulatory Visit (HOSPITAL_COMMUNITY)
Admission: RE | Admit: 2013-07-29 | Discharge: 2013-07-29 | Disposition: A | Discharge status: Home / Self Care | Payer: BC Managed Care – PPO | Source: Ambulatory Visit | Attending: Cardiovascular Disease | Admitting: Cardiovascular Disease

## 2013-07-29 DIAGNOSIS — R0989 Other specified symptoms and signs involving the circulatory and respiratory systems: Secondary | ICD-10-CM

## 2013-07-29 DIAGNOSIS — I739 Peripheral vascular disease, unspecified: Secondary | ICD-10-CM

## 2013-07-29 DIAGNOSIS — R0609 Other forms of dyspnea: Secondary | ICD-10-CM

## 2013-07-29 DIAGNOSIS — Z720 Tobacco use: Secondary | ICD-10-CM

## 2013-07-29 DIAGNOSIS — F17200 Nicotine dependence, unspecified, uncomplicated: Secondary | ICD-10-CM | POA: Insufficient documentation

## 2013-07-29 MED ORDER — TECHNETIUM TC 99M SESTAMIBI GENERIC - CARDIOLITE
10.5000 | Freq: Once | INTRAVENOUS | Status: AC | PRN
  Administered 2013-07-29: 11 via INTRAVENOUS

## 2013-07-29 MED ORDER — AMINOPHYLLINE 25 MG/ML IV SOLN
75.0000 mg | Freq: Once | INTRAVENOUS | Status: AC
  Administered 2013-07-29: 75 mg via INTRAVENOUS

## 2013-07-29 MED ORDER — REGADENOSON 0.4 MG/5ML IV SOLN
0.4000 mg | Freq: Once | INTRAVENOUS | Status: AC
  Administered 2013-07-29: 0.4 mg via INTRAVENOUS

## 2013-07-29 MED ORDER — TECHNETIUM TC 99M SESTAMIBI GENERIC - CARDIOLITE
30.9000 | Freq: Once | INTRAVENOUS | Status: AC | PRN
  Administered 2013-07-29: 30.9 via INTRAVENOUS

## 2013-07-29 NOTE — Procedures (Addendum)
[facility] [address] [place] [address] [phone]  Cardiology Nuclear Med Study  Michael Maddox is a 63 y.o. male     MRN : [id number]     DOB: [birth year]  Procedure Date: 07/29/2013  Nuclear Med Background Indication for Stress Test:  Surgical Clearance History:  No prior cardiac or respiratory history reported;Last NUC study 12 years ago-not avialable in cardwriter Cardiac Risk Factors: Carotid Disease, CVA, History of Smoking, Hypertension, Lipids and PVD  Symptoms:  Dizziness, DOE, Fatigue, Light-Headedness, Palpitations, SOB and presyncope   Nuclear Pre-Procedure Caffeine/Decaff Intake:  7:00pm NPO After: 5:00am   IV Site: R Forearm  IV 0.9% NS with Angio Cath:  22g  Chest Size (in):  38"  IV Started by: Azucena Cecil, RN  Height: 6' (1.829 m)  Cup Size: n/a  BMI:  Body mass index is 21.56 kg/(m^2). Weight:  159 lb (72.122 kg)   Tech Comments:  n/a    Nuclear Med Study 1 or 2 day study: 1 day  Stress Test Type:  [place] Provider:  Quay Burow, MD   Resting Radionuclide: Technetium 47m Sestamibi  Resting Radionuclide Dose: 10.5 mCi   Stress Radionuclide:  Technetium 49m Sestamibi  Stress Radionuclide Dose: 30.9 mCi           Stress Protocol Rest HR:42 Stress HR: 62  Rest BP: 137/79 Stress BP:137/79  Exercise Time (min): n/a METS: n/a          Dose of Adenosine (mg):  n/a Dose of Lexiscan: 0.4 mg  Dose of Atropine (mg): n/a Dose of Dobutamine: n/a mcg/kg/min (at max HR)  Stress Test Technologist: Mellody Memos, CCT Nuclear Technologist: Imagene Riches, CNMT   Rest Procedure:  Myocardial perfusion imaging was performed at rest 45 minutes following the intravenous administration of Technetium 13m Sestamibi. Stress Procedure:  The patient received IV Lexiscan 0.4 mg over 15-seconds.  Technetium 58m Sestamibi injected at 30-seconds. Patient experienced shortness of breath, drop in  blood pressure, dizziness and was administered 75 mg of Aminophylline IV at 5 minutes. There were no significant changes with Lexiscan.  Quantitative spect images were obtained after a 45 minute delay.  Transient Ischemic Dilatation (Normal <1.22):  1.10 Lung/Heart Ratio (Normal <0.45):  0.28 QGS EDV:  99 ml QGS ESV:  48 ml LV Ejection Fraction: 52%  Rest ECG: NSR-LVH  Stress ECG: No significant change from baseline ECG  QPS Raw Data Images:  There is interference from nuclear activity from structures below the diaphragm. This does not affect the ability to read the study. Stress Images:  Decreased inferoseptal uptake Rest Images:  There is decreased uptake in the inferior wall. Subtraction (SDS):  SDS 8, inferoseptal ischemia  Impression Exercise Capacity:  Lexiscan with no exercise. BP Response:  Hypotensive blood pressure response. Clinical Symptoms:  No significant symptoms noted. ECG Impression:  No significant ECG changes with Lexiscan. Comparison with Prior Nuclear Study: No images to compare  Overall Impression:  High risk stress nuclear study with inferoseptal ischemia - there is underlying bowel artifact, however the SDS is 8. Clinical correlation advised.  LV Wall Motion:  NL LV Function; NL Wall Motion; EF 52%  Pixie Casino, MD, [facility] Board Certified in Nuclear Cardiology Attending Cardiologist [place]  Pixie Casino, MD  07/30/2013 5:12 PM

## 2013-08-04 ENCOUNTER — Encounter: Payer: Self-pay | Admitting: Cardiovascular Disease

## 2013-08-04 ENCOUNTER — Ambulatory Visit (INDEPENDENT_AMBULATORY_CARE_PROVIDER_SITE_OTHER): Payer: BC Managed Care – PPO | Admitting: Cardiovascular Disease

## 2013-08-04 VITALS — BP 132/62 | HR 54 | Ht 71.0 in | Wt 157.6 lb

## 2013-08-04 DIAGNOSIS — I2510 Atherosclerotic heart disease of native coronary artery without angina pectoris: Secondary | ICD-10-CM

## 2013-08-04 NOTE — Assessment & Plan Note (Signed)
The patient has 3 vessel disease documented by cardiac catheterization which I performed 12/01/00. He is a 7580% proximal to mid dominant RCA, 75% proximal large OM 1 branch stenosis and 50% mid LAD with normal LV function. At that time medical therapy was recommended. He has had progressive increasing dyspnea on exertion and fatigue. Recent Myoview stress test showed inferoseptal ischemia. Based on this, I'm going to perform cardiac catheterization at time of his upcoming vascular intervention/intervention to define his coronary anatomy.

## 2013-08-04 NOTE — Patient Instructions (Signed)
Dr [name] is going to look at your coronary arteries during the lower extremity angiogram that you already have set up.  Left groin stick

## 2013-08-04 NOTE — Progress Notes (Signed)
08/04/2013 [name]   18-Apr-1951  [id number]  Primary Physician [name], MD Primary Cardiologist: Lorretta Harp MD Renae Gloss    HPI:  Mr. Conaway is a 63 year old appearing married African American male for a father of 3 children, grandfather of 2 grandchildren who is a patient of Dr. Christean Grief Avebuere's he has a history of peripheral arterial disease status post left iliac and bilateral SFA intervention most recently in 2005 by myself. He discontinued tobacco abuse at that time. He also has 2 hypertension and hyperlipidemia. He had right carotid endarterectomy performed by Dr. [name] in November 2010 for high-grade disease in the setting of a stroke. He denies chest pain but does get somewhat shortness of breath which has not changed. His last functional study was performed 12 years ago and was unremarkable. The palpitations are fairly recent onset. He can "hear his heartbeat in his ears" and does feel somewhat dizzy/presyncopal. A Doppler study of his lower extremities revealed severe arterial insufficiency with a right ABI 0.25 the left of 0.39. He had severe iliac and SFA disease. Carotid Dopplers were performed as well which revealed moderate right carotid disease and moderate to severe left. His Myoview stress test that showed inferoseptal ischemia thought to be "high risk". Because of this, I'm going to perform a catheterization the left femoral approach at the time of his peripheral angiogram   Current Outpatient Prescriptions  Medication Sig Dispense Refill  . clopidogrel (PLAVIX) 75 MG tablet Take 75 mg by mouth daily with breakfast.      . Flaxseed, Linseed, (FLAX SEED OIL) 1000 MG CAPS Take 2 capsules by mouth daily.      . Multiple Vitamin (MULTIVITAMIN WITH MINERALS) TABS tablet Take 1 tablet by mouth daily.      . nebivolol (BYSTOLIC) 5 MG tablet Take 5 mg by mouth daily.      . niacin-simvastatin (SIMCOR) 500-20 MG 24 hr tablet Take 1 tablet by  mouth at bedtime.       No current facility-administered medications for this visit.    No Known Allergies  History   Social History  . Marital Status: Married    Spouse Name: N/A    Number of Children: N/A  . Years of Education: N/A   Occupational History  . Not on file.   Social History Main Topics  . Smoking status: Former Smoker    Types: Cigarettes    Quit date: 07/03/2003  . Smokeless tobacco: Not on file  . Alcohol Use: 2.5 oz/week    5 drink(s) per week  . Drug Use: Not on file  . Sexual Activity: Not on file   Other Topics Concern  . Not on file   Social History Narrative  . No narrative on file     Review of Systems: General: negative for chills, fever, night sweats or weight changes.  Cardiovascular: negative for chest pain, dyspnea on exertion, edema, orthopnea, palpitations, paroxysmal nocturnal dyspnea or shortness of breath Dermatological: negative for rash Respiratory: negative for cough or wheezing Urologic: negative for hematuria Abdominal: negative for nausea, vomiting, diarrhea, bright red blood per rectum, melena, or hematemesis Neurologic: negative for visual changes, syncope, or dizziness All other systems reviewed and are otherwise negative except as noted above.    Blood pressure 132/62, pulse 54, height 5\' 11"  (1.803 m), weight 157 lb 9.6 oz (71.487 kg).  General appearance: alert and no distress Neck: no adenopathy, no JVD, supple, symmetrical, trachea midline, thyroid not enlarged,  symmetric, no tenderness/mass/nodules and soft left carotid bruit Lungs: clear to auscultation bilaterally Heart: regular rate and rhythm, S1, S2 normal, no murmur, click, rub or gallop Extremities: extremities normal, atraumatic, no cyanosis or edema  EKG not performed today  ASSESSMENT AND PLAN:   Coronary artery disease The patient has 3 vessel disease documented by cardiac catheterization which I performed 12/01/00. He is a 7580% proximal to mid  dominant RCA, 75% proximal large OM 1 branch stenosis and 50% mid LAD with normal LV function. At that time medical therapy was recommended. He has had progressive increasing dyspnea on exertion and fatigue. Recent Myoview stress test showed inferoseptal ischemia. Based on this, I'm going to perform cardiac catheterization at time of his upcoming vascular intervention/intervention to define his coronary anatomy.      Lorretta Harp MD FACP,FACC,FAHA, [facility] 08/04/2013 4:22 PM

## 2013-08-09 ENCOUNTER — Encounter: Payer: Self-pay | Admitting: Cardiovascular Disease

## 2013-08-10 ENCOUNTER — Ambulatory Visit
Admission: RE | Admit: 2013-08-10 | Discharge: 2013-08-10 | Disposition: A | Discharge status: Home / Self Care | Payer: BC Managed Care – PPO | Source: Ambulatory Visit | Attending: Cardiovascular Disease | Admitting: Cardiovascular Disease

## 2013-08-10 DIAGNOSIS — Z720 Tobacco use: Secondary | ICD-10-CM

## 2013-08-10 DIAGNOSIS — I739 Peripheral vascular disease, unspecified: Secondary | ICD-10-CM

## 2013-08-10 LAB — BASIC METABOLIC PANEL
BUN: 12 mg/dL (ref 6–23)
CHLORIDE: 105 meq/L (ref 96–112)
CO2: 29 meq/L (ref 19–32)
Calcium: 9.1 mg/dL (ref 8.4–10.5)
Creat: 1.03 mg/dL (ref 0.50–1.35)
GLUCOSE: 131 mg/dL — AB (ref 70–99)
POTASSIUM: 4.4 meq/L (ref 3.5–5.3)
Sodium: 140 mEq/L (ref 135–145)

## 2013-08-10 LAB — PROTIME-INR
INR: 0.91 (ref <1.50)
Prothrombin Time: 12.2 seconds (ref 11.6–15.2)

## 2013-08-10 LAB — APTT: aPTT: 31 seconds (ref 24–37)

## 2013-08-10 LAB — TSH: TSH: 3.36 u[IU]/mL (ref 0.350–4.500)

## 2013-08-11 ENCOUNTER — Encounter (HOSPITAL_COMMUNITY): Payer: Self-pay | Admitting: Pharmacy Technician

## 2013-08-11 LAB — CBC
HEMATOCRIT: 42.9 % (ref 39.0–52.0)
Hemoglobin: 14 g/dL (ref 13.0–17.0)
MCH: 29.5 pg (ref 26.0–34.0)
MCHC: 32.6 g/dL (ref 30.0–36.0)
MCV: 90.5 fL (ref 78.0–100.0)
PLATELETS: 228 10*3/uL (ref 150–400)
RBC: 4.74 MIL/uL (ref 4.22–5.81)
RDW: 13.5 % (ref 11.5–15.5)
WBC: 4.6 10*3/uL (ref 4.0–10.5)

## 2013-08-12 ENCOUNTER — Ambulatory Visit (INDEPENDENT_AMBULATORY_CARE_PROVIDER_SITE_OTHER): Payer: Self-pay | Admitting: *Deleted

## 2013-08-12 DIAGNOSIS — I639 Cerebral infarction, unspecified: Secondary | ICD-10-CM

## 2013-08-12 DIAGNOSIS — I6350 Cerebral infarction due to unspecified occlusion or stenosis of unspecified cerebral artery: Secondary | ICD-10-CM

## 2013-08-12 NOTE — Progress Notes (Signed)
Michael Maddox in the office today for his IRIS 4th. Annual and EXIT Visit. MMSE was completed successfully without problems.  Blood  was drawn and shipped.  Last dose of the study drug was taken on 08/11/2013.  Last bottle of study medication was returned to the [facility].  Michael Maddox will continue with follow-up visits with his PCP.  Michael Maddox will be notified of study trial results in late 2015.  Michael Maddox is to have a FBS drawn in two months  with his PCP.

## 2013-08-16 ENCOUNTER — Encounter (HOSPITAL_COMMUNITY)
Admission: RE | Disposition: A | Discharge status: Home / Self Care | Payer: Self-pay | Source: Ambulatory Visit | Attending: Cardiovascular Disease

## 2013-08-16 ENCOUNTER — Ambulatory Visit (HOSPITAL_COMMUNITY)
Admission: RE | Admit: 2013-08-16 | Discharge: 2013-08-17 | Disposition: A | Discharge status: Home / Self Care | Payer: BC Managed Care – PPO | Source: Ambulatory Visit | Attending: Cardiovascular Disease | Admitting: Cardiovascular Disease

## 2013-08-16 ENCOUNTER — Ambulatory Visit (HOSPITAL_COMMUNITY): Payer: BC Managed Care – PPO

## 2013-08-16 ENCOUNTER — Other Ambulatory Visit: Payer: Self-pay | Admitting: *Deleted

## 2013-08-16 ENCOUNTER — Encounter (HOSPITAL_COMMUNITY): Payer: Self-pay | Admitting: General Practice

## 2013-08-16 DIAGNOSIS — I70219 Atherosclerosis of native arteries of extremities with intermittent claudication, unspecified extremity: Secondary | ICD-10-CM | POA: Insufficient documentation

## 2013-08-16 DIAGNOSIS — Z8249 Family history of ischemic heart disease and other diseases of the circulatory system: Secondary | ICD-10-CM | POA: Insufficient documentation

## 2013-08-16 DIAGNOSIS — Z8673 Personal history of transient ischemic attack (TIA), and cerebral infarction without residual deficits: Secondary | ICD-10-CM | POA: Insufficient documentation

## 2013-08-16 DIAGNOSIS — Z9889 Other specified postprocedural states: Secondary | ICD-10-CM | POA: Insufficient documentation

## 2013-08-16 DIAGNOSIS — E785 Hyperlipidemia, unspecified: Secondary | ICD-10-CM | POA: Insufficient documentation

## 2013-08-16 DIAGNOSIS — I2510 Atherosclerotic heart disease of native coronary artery without angina pectoris: Secondary | ICD-10-CM | POA: Diagnosis present

## 2013-08-16 DIAGNOSIS — R0602 Shortness of breath: Secondary | ICD-10-CM | POA: Insufficient documentation

## 2013-08-16 DIAGNOSIS — I6529 Occlusion and stenosis of unspecified carotid artery: Secondary | ICD-10-CM | POA: Insufficient documentation

## 2013-08-16 DIAGNOSIS — K219 Gastro-esophageal reflux disease without esophagitis: Secondary | ICD-10-CM | POA: Insufficient documentation

## 2013-08-16 DIAGNOSIS — F17200 Nicotine dependence, unspecified, uncomplicated: Secondary | ICD-10-CM | POA: Insufficient documentation

## 2013-08-16 DIAGNOSIS — I658 Occlusion and stenosis of other precerebral arteries: Secondary | ICD-10-CM | POA: Insufficient documentation

## 2013-08-16 DIAGNOSIS — Z7902 Long term (current) use of antithrombotics/antiplatelets: Secondary | ICD-10-CM | POA: Insufficient documentation

## 2013-08-16 DIAGNOSIS — I739 Peripheral vascular disease, unspecified: Secondary | ICD-10-CM

## 2013-08-16 HISTORY — DX: Gastro-esophageal reflux disease without esophagitis: K21.9

## 2013-08-16 HISTORY — DX: Unspecified osteoarthritis, unspecified site: M19.90

## 2013-08-16 HISTORY — PX: LOWER EXTREMITY ANGIOGRAM: SHX5508

## 2013-08-16 HISTORY — DX: Shortness of breath: R06.02

## 2013-08-16 HISTORY — PX: LEFT HEART CATHETERIZATION WITH CORONARY ANGIOGRAM: SHX5451

## 2013-08-16 SURGERY — ANGIOGRAM, LOWER EXTREMITY
Anesthesia: LOCAL

## 2013-08-16 MED ORDER — NITROGLYCERIN 0.2 MG/ML ON CALL CATH LAB
INTRAVENOUS | Status: AC
  Filled 2013-08-16: qty 1

## 2013-08-16 MED ORDER — NEBIVOLOL HCL 5 MG PO TABS
5.0000 mg | ORAL_TABLET | Freq: Every day | ORAL | Status: DC
  Filled 2013-08-16: qty 1

## 2013-08-16 MED ORDER — ASPIRIN 81 MG PO CHEW: 81.0000 mg | CHEWABLE_TABLET | ORAL | Status: DC

## 2013-08-16 MED ORDER — SODIUM CHLORIDE 0.9 % IV SOLN: INTRAVENOUS | Status: AC

## 2013-08-16 MED ORDER — ASPIRIN 81 MG PO CHEW
81.0000 mg | CHEWABLE_TABLET | Freq: Every day | ORAL | Status: DC
  Administered 2013-08-17: 81 mg via ORAL
  Filled 2013-08-16 (×2): qty 1

## 2013-08-16 MED ORDER — MIDAZOLAM HCL 2 MG/2ML IJ SOLN
INTRAMUSCULAR | Status: AC
  Filled 2013-08-16: qty 2

## 2013-08-16 MED ORDER — PNEUMOCOCCAL VAC POLYVALENT 25 MCG/0.5ML IJ INJ
0.5000 mL | INJECTION | INTRAMUSCULAR | Status: AC
  Administered 2013-08-17: 0.5 mL via INTRAMUSCULAR
  Filled 2013-08-16: qty 0.5

## 2013-08-16 MED ORDER — MORPHINE SULFATE 2 MG/ML IJ SOLN: 2.0000 mg | INTRAMUSCULAR | Status: DC | PRN

## 2013-08-16 MED ORDER — DIAZEPAM 5 MG PO TABS
ORAL_TABLET | ORAL | Status: AC
  Administered 2013-08-16: 5 mg
  Filled 2013-08-16: qty 1

## 2013-08-16 MED ORDER — ZOLPIDEM TARTRATE 5 MG PO TABS
5.0000 mg | ORAL_TABLET | Freq: Every evening | ORAL | Status: DC | PRN
  Administered 2013-08-16: 5 mg via ORAL
  Filled 2013-08-16: qty 1

## 2013-08-16 MED ORDER — LIDOCAINE HCL (PF) 1 % IJ SOLN
INTRAMUSCULAR | Status: AC
  Filled 2013-08-16: qty 30

## 2013-08-16 MED ORDER — HEPARIN (PORCINE) IN NACL 2-0.9 UNIT/ML-% IJ SOLN
INTRAMUSCULAR | Status: AC
  Filled 2013-08-16: qty 1000

## 2013-08-16 MED ORDER — FENTANYL CITRATE 0.05 MG/ML IJ SOLN
INTRAMUSCULAR | Status: AC
  Filled 2013-08-16: qty 2

## 2013-08-16 MED ORDER — HEPARIN (PORCINE) IN NACL 2-0.9 UNIT/ML-% IJ SOLN
INTRAMUSCULAR | Status: AC
  Filled 2013-08-16: qty 500

## 2013-08-16 MED ORDER — NEBIVOLOL HCL 5 MG PO TABS
5.0000 mg | ORAL_TABLET | Freq: Every day | ORAL | Status: DC
  Administered 2013-08-17: 11:00:00 5 mg via ORAL
  Filled 2013-08-16: qty 1

## 2013-08-16 MED ORDER — ASPIRIN 81 MG PO CHEW
CHEWABLE_TABLET | ORAL | Status: AC
  Administered 2013-08-16: 81 mg
  Filled 2013-08-16: qty 1

## 2013-08-16 MED ORDER — IBUPROFEN 400 MG PO TABS
400.0000 mg | ORAL_TABLET | Freq: Three times a day (TID) | ORAL | Status: DC | PRN
  Filled 2013-08-16: qty 1

## 2013-08-16 MED ORDER — SODIUM CHLORIDE 0.9 % IV SOLN: INTRAVENOUS | Status: DC

## 2013-08-16 MED ORDER — DIAZEPAM 5 MG PO TABS: 5.0000 mg | ORAL_TABLET | ORAL | Status: DC

## 2013-08-16 MED ORDER — SODIUM CHLORIDE 0.9 % IJ SOLN: 3.0000 mL | INTRAMUSCULAR | Status: DC | PRN

## 2013-08-16 NOTE — CV Procedure (Signed)
Michael Maddox is a 63 y.o. male    [id number] LOCATION:  FACILITY: [facility]  PHYSICIAN: Quay Burow, M.D. 14-Nov-1950   DATE OF PROCEDURE:  08/16/2013  DATE OF DISCHARGE:     PV Angiogram/Intervention    History obtained from chart review.Michael Maddox is a 63 year old appearing married African American male for a father of 3 children, grandfather of 2 grandchildren who is a patient of Dr. Christean Grief Avebuere's he has a history of peripheral arterial disease status post left iliac and bilateral SFA intervention most recently in 2005 by myself. He discontinued tobacco abuse at that time. He also has 2 hypertension and hyperlipidemia. He had right carotid endarterectomy performed by Dr. [name] in November 2010 for high-grade disease in the setting of a stroke. He denies chest pain but does get somewhat shortness of breath which has not changed. His last functional study was performed 12 years ago and was unremarkable. The palpitations are fairly recent onset. He can "hear his heartbeat in his ears" and does feel somewhat dizzy/presyncopal. A Doppler study of his lower extremities revealed severe arterial insufficiency with a right ABI 0.25 the left of 0.39. He had severe iliac and SFA disease. Carotid Dopplers were performed as well which revealed moderate right carotid disease and moderate to severe left. His Myoview stress test that showed inferoseptal ischemia thought to be "high risk". Because of this, I'm going to perform a catheterization the left femoral approach at the time of his peripheral angiogram    PROCEDURE DESCRIPTION:   The patient was brought to the second floor Portal Cardiac cath lab in the postabsorptive state. He was premedicated with Valium 5 mg by mouth, IV Versed and fentanyl. His left groinwas prepped and shaved in usual sterile fashion. Xylocaine 1% was used for local anesthesia. A 5 French sheath was inserted into the left common femoral  artery using standard  Seldinger technique and Doppler-tipped needle. A 5 French pigtail catheter was used for abdominal aortography with bilateral iliac angiography and bifemoral runoff. Visipaque dye was used for the entirety of the case. A total of 180 cc of contrast was administered the patient. Retrograde aortic pressure was monitored during the case.  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 The previously placed left external iliac artery stent was patent. The left external iliac artery and common femoral artery were diffusely diseased with a focal 80-90% stenosis. The SFA was occluded at its origin with reconstitution in the adductor canal by profunda femoris collaterals. There was three-vessel runoff.  3: Right lower extremity-the right common and external iliac artery were occluded. The right common femoral artery filled by collaterals as did the profunda femoris. The right SFA was occluded at its origin with reconstitution in the adductor canal. There was three-vessel runoff.  IMPRESSION:Michael Maddox will need coronary artery bypass grafting for his three-vessel disease prior to addressing his lower extremity arterial occlusive disease. I believe the best form of therapy would be aortobifemoral bypass grafting. The sheath was removed and pressure was held on the groin to achieve hemostasis. The patient left the Cath Lab in stable condition.   Lorretta Harp. MD, [facility] 08/16/2013 11:40 AM

## 2013-08-16 NOTE — Progress Notes (Signed)
VASCULAR LAB PRELIMINARY  PRELIMINARY  PRELIMINARY  PRELIMINARY  Pre-op Cardiac Surgery  Carotid Findings:  Completed at [facility] 07/15/2013 results in Results Review  Upper Extremity Right Left  Brachial Pressures 115 Triphasic 122 Triphasic  Radial Waveforms Triphasic Triphasic  Ulnar Waveforms Triphasic Triphasic  Palmar Arch (Allen's Test) Normal Normal   Findings:  Doppler waveforms remained normal bilaterally with both radial and ulnar compressions    Lower  Extremity Right Left  Dorsalis Pedis    Anterior Tibial    Posterior Tibial    Ankle/Brachial Indices      Findings:  Study completed at [facility] 07/15/2013 Results found in Results Review   [employer], [place] 08/16/2013, 4:06 PM

## 2013-08-16 NOTE — Consult Note (Signed)
Reason for Consult:3 vessel CAD  Referring Physician: Dr. [name] Dr. Sheliah Hatch is an 63 y.o. male.  HPI: 63 yo male with known ASCVD and CAD and multiple cardiac risk factors presents with cc/o palpitaions, dizziness and heaviness in his chest.   Mr. Friesen is a 63 yo man with multiple CRF including hypertension, hyperlipidemia, tobacco abuse and a positive family history of "heart trouble." He has an extensive history of ASCVD. He has had left common iliac and bilateral SFA stents most recently in 2005. He had a stroke in 2010 and had a right carotid endarterectomy by Dr. Scot Dock. He had smoked regularly until that time, but "quit" and now only smokes cigarettes occasionally.   He has been having claudication in his legs bilaterally right > left and his ABIs were 0.25 on the left and 0.39 on the right. He was being evaluated for a peripheral arteriogram prior to a vascular intervention. He noted that he had been having chest heaviness with exertion and palpitations at times as well. He also c/o some dizziness. A myoview stress test showed inferoseptal ischemia.   Today he had cardiac catheterization which revealed severe 3 vessel CAD and a 60% left subclavian stenosis. He is currently pain free. He also had an abdominal aortagram with peripheral runoff. It showed severe iliac disease and SFA occlusion bilaterally. He is taking plavix.  He currently works full time as an Pension scheme manager.  Past Medical History  Diagnosis Date  . Stroke 2010  . HTN (hypertension)   . Hyperlipemia   . History of stress test 02/23/2003    A nagative cardiolite stress test  . Palpitations   . Peripheral arterial disease   . Carotid artery disease   . Coronary artery disease     history of 3 vessel CAD at cardiac catheterization 12/01/00  . Shortness of breath   . GERD (gastroesophageal reflux disease)   . 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 Arterial insufficiency     SEVERE   ILLIAC & SFA     Past Surgical History  Procedure Laterality Date  . Stents  2004    He had left common ilac artery percutaneous transluminal angioplasty and stenting and bilateral superfacial femoral artery percutaneous transluminal angioplasty and stenting.  . Angiograpy  2005    Revealing a moderate long segment occlusion within his right superficial femoral artery stent which i was able to re-canalize and re-stent for symptomatic claudication.  . Carotid endarterectomy      Family History  Problem Relation Age of Onset  . Diabetes Mother   . Heart disease Father     Social History:  reports that he has been smoking Cigarettes.  He has been smoking about 0.00 packs per day. He has never used smokeless tobacco. He reports that he drinks about 2.5 ounces of alcohol per week. He reports that he does not use illicit drugs.  Allergies: No Known Allergies  Medications:  Prior to Admission:  Prescriptions prior to admission  Medication Sig Dispense Refill  . clopidogrel (PLAVIX) 75 MG tablet Take 75 mg by mouth daily with breakfast.      . Flaxseed, Linseed, (FLAX SEED OIL) 1000 MG CAPS Take 2 capsules by mouth daily.      Marland Kitchen ibuprofen (ADVIL,MOTRIN) 200 MG tablet Take 400 mg by mouth every 8 (eight) hours as needed for moderate pain.      . Multiple Vitamin (MULTIVITAMIN WITH MINERALS) TABS tablet Take 1 tablet by  mouth daily.      . nebivolol (BYSTOLIC) 5 MG tablet Take 5 mg by mouth daily.      . niacin-simvastatin (SIMCOR) 500-20 MG 24 hr tablet Take 1 tablet by mouth at bedtime.      . vitamin C (ASCORBIC ACID) 500 MG tablet Take 500 mg by mouth daily.        No results found for this or any previous visit (from the past 48 hour(s)).  No results found.  Review of Systems  Constitutional: Positive for malaise/fatigue. Negative for fever and chills.  Respiratory: Positive for shortness of breath (with exertion).   Cardiovascular: Positive for chest pain ("heaviness"), palpitations and  claudication.       +claudication R>L  Gastrointestinal: Negative for heartburn.  Genitourinary: Negative.   Musculoskeletal: Positive for joint pain.  Neurological: Negative.        History of stroke in 2010, no residual deficit  All other systems reviewed and are negative.  Blood pressure 147/49, pulse 48, temperature 97.9 F (36.6 C), temperature source Oral, resp. rate 20, height 5\' 11"  (1.803 m), weight 155 lb (70.308 kg), SpO2 100.00%. Physical Exam  Vitals reviewed. Constitutional: He is oriented to person, place, and time. He appears well-developed and well-nourished. No distress.  HENT:  Head: Normocephalic and atraumatic.  Eyes: EOM are normal. Pupils are equal, round, and reactive to light.  Neck: No tracheal deviation present. No thyromegaly present.  Well healed incision R neck, + bruit on L  Cardiovascular: Normal rate, regular rhythm and normal heart sounds.  Exam reveals no gallop and no friction rub.   No murmur heard. Respiratory: Effort normal and breath sounds normal. He has no wheezes. He has no rales.  GI: Soft. There is no tenderness.  Musculoskeletal: He exhibits no edema.  Neurological: He is alert and oriented to person, place, and time. No cranial nerve deficit.  Skin: Skin is warm and dry.   PERIPHERAL ARTERIOGRAM Angiographic Data:  1: Left subclavian artery-60% proximal left subclavian artery stenosis with a 20 mm pullback gradient  2: Left lower extremity-60% proximal segmental left common iliac artery stenosis. The previously placed left external iliac artery stent was patent. The left external iliac artery and common femoral artery were diffusely diseased with a focal 80-90% stenosis. The SFA was occluded at its origin with reconstitution in the adductor canal by profunda femoris collaterals. There was three-vessel runoff.  3: Right lower extremity-the right common and external iliac artery were occluded. The right common femoral artery filled by  collaterals as did the profunda femoris. The right SFA was occluded at its origin with reconstitution in the adductor canal. There was three-vessel runoff.  CARDIAC CATHETERIZATION ANGIOGRAPHIC RESULTS:  1. Left main; normal  2. LAD; 95% proximal to mid at a diagonal branch takeoff  3. Left circumflex; 75% segmental proximal OM1.  4. 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 Left ventriculography;not performed today  Assessment/Plan: 63 yo man with multiple cardiac risk factors and severe premature ASCVD presents with claudication and chest heaviness and palpitations. He has severe peripheral arterial disease as well. Catheterization today revealed severe 3 vessel disease with an EF of 50% and a 60% left subclavian stenosis.   CABG is indicated for survival benefit and relief of symptoms. It is also necessary to revascularize his heart prior to  his upcoming peripheral arterial surgery.  I discussed the general nature of the procedure, the need for general anesthesia, and the incisions to be used with Mr. Prieur and his family. I informed them of the indications, risks, benefits and alternatives. We discussed the expected hospital stay, the overall recovery and the short and long term outcomes. They understand the risks include, but are not limited to death, stroke, MI, DVT/PE, bleeding, possible need for transfusion, infections, cardiac arrhythmias, and other organ system dysfunction including respiratory, renal, or GI complications. They understand he does have an elevated risk for stroke due to his history.  He accepts the risks and agrees to proceed.  He needs to be off plavix for 5-7 days prior to CABG. We are planning CABG on Monday 08/23/2013. He will be discharged this afternoon  Melrose Nakayama 08/16/2013, 1:35 PM

## 2013-08-16 NOTE — Care Management Note (Addendum)
  Page 1 of 1   08/17/2013     11:44:43 AM CARE MANAGEMENT NOTE 08/17/2013  Patient:  Michael Maddox, Michael Maddox   Account Number:  [id number]  Date Initiated:  08/16/2013  Documentation initiated by:  Mariann Laster  Subjective/Objective Assessment:   Admitted with claudication and Left heart cath     Action/Plan:   CM to follow for disposition needs   Anticipated DC Date:  08/17/2013   Anticipated DC Plan:           Choice offered to / List presented to:             Status of service:  Completed, signed off Medicare Important Message given?   (If response is "NO", the following Medicare IM given date fields will be blank) Date Medicare IM given:   Date Additional Medicare IM given:    Discharge Disposition:  HOME/SELF CARE  Per UR Regulation:    If discussed at Long Length of Stay Meetings, dates discussed:    Comments:  No meds needs identified at this time. Disposition Plan:  Patient to be off Plavix x 5-7 days to prepare for CABG on 08/23/2013 Mariann Laster, RN, BSN, [place], CCM 08/16/2013

## 2013-08-16 NOTE — CV Procedure (Signed)
Michael Maddox is a 63 y.o. male    [id number] LOCATION:  FACILITY: [facility]  PHYSICIAN: Quay Burow, M.D. 03-Sep-1950   DATE OF PROCEDURE:  08/16/2013  DATE OF DISCHARGE:     CARDIAC CATHETERIZATION     History obtained from chart review.Michael Maddox is a 63 year old appearing married African American male for a father of 3 children, grandfather of 2 grandchildren who is a patient of Michael Maddox he has a history of peripheral arterial disease status post left iliac and bilateral SFA intervention most recently in 2005 by myself. He discontinued tobacco abuse at that time. He also has 2 hypertension and hyperlipidemia. He had right carotid endarterectomy performed by Michael Maddox in November 2010 for high-grade disease in the setting of a stroke. He denies chest pain but does get somewhat shortness of breath which has not changed. His last functional study was performed 12 years ago and was unremarkable. The palpitations are fairly recent onset. He can "hear his heartbeat in his ears" and does feel somewhat dizzy/presyncopal. A Doppler study of his lower extremities revealed severe arterial insufficiency with a right ABI 0.25 the left of 0.39. He had severe iliac and SFA disease. Carotid Dopplers were performed as well which revealed moderate right carotid disease and moderate to severe left. His Myoview stress test that showed inferoseptal ischemia thought to be "high risk". Because of this, I'm going to perform a catheterization the left femoral approach at the time of his peripheral angiogram    PROCEDURE DESCRIPTION:   The patient was brought to the second floor [facility] cath lab in the postabsorptive state. He was premedicated with Valium 5 mg by mouth, IV Versed and fentanyl. His left groinwas prepped and shaved in usual sterile fashion. Xylocaine 1% was used for local anesthesia. A 5 French sheath was inserted into the left common femoral  artery using standard  Seldinger technique.5 French right and left Judkins diagnostic catheters were used to perform selective coronary angiography and selective left subclavian artery angiography looking at the LIMA. A pullback gradient was performed across the proximal left subclavian artery stenosis. Visipaque allergies for the entirety of the case. Retrograde aortic pressure was monitored during the case.  HEMODYNAMICS:    AO SYSTOLIC/AO DIASTOLIC: 412/87   ANGIOGRAPHIC RESULTS:   1. Left main; normal  2. LAD; 95% [place] to mid at a diagonal branch takeoff 3. Left circumflex; 75% segmental proximal OM1.  4. Right coronary artery; dominant, total with grade 2 left-to-right collaterals 5.LIMA TO LAD;subselective view of the LIMA was performed revealing a 60% proximal left subclavian artery stenosis with a 20 mm pullback gradient 6. Left ventriculography;not performed today  IMPRESSION:Michael Maddox has 3 vessel disease and an abnormal Myoview. He'll need CABG. He does not have unstable angina. He is on Plavix. I believe that he can be discharged after being seen by the cardiothoracic surgeons for a Plavix washout before his CABG. The sheath was removed and pressure was held on the groin to achieve hemostasis. The patient left the Cath Lab in stable condition.  Michael Maddox. MD, [facility] 08/16/2013 11:35 AM

## 2013-08-16 NOTE — Interval H&P Note (Signed)
History and Physical Interval Note:  08/16/2013 10:25 AM  Michael Maddox  has presented today for surgery, with the diagnosis of claudication  The various methods of treatment have been discussed with the patient and family. After consideration of risks, benefits and other options for treatment, the patient has consented to  Procedure(s): LOWER EXTREMITY ANGIOGRAM (N/A) LEFT HEART CATHETERIZATION WITH CORONARY ANGIOGRAM (N/A) as a surgical intervention .  The patient's history has been reviewed, patient examined, no change in status, stable for surgery.  I have reviewed the patient's chart and labs.  Questions were answered to the patient's satisfaction.     Lorretta Harp

## 2013-08-16 NOTE — H&P (View-Only) (Signed)
08/04/2013 Michael Maddox   Apr 17, 1951  [id number]  Primary Physician Michael Fendt, MD Primary Cardiologist: Michael Harp MD Michael Maddox    HPI:  Michael Maddox is a 63 year old appearing married African American male for a father of 3 children, grandfather of 2 grandchildren who is a patient of Michael Maddox's he has a history of peripheral arterial disease status post left iliac and bilateral SFA intervention most recently in 2005 by myself. He discontinued tobacco abuse at that time. He also has 2 hypertension and hyperlipidemia. He had right carotid endarterectomy performed by Dr. Joseph Maddox in November 2010 for high-grade disease in the setting of a stroke. He denies chest pain but does get somewhat shortness of breath which has not changed. His last functional study was performed 12 years ago and was unremarkable. The palpitations are fairly recent onset. He can "hear his heartbeat in his ears" and does feel somewhat dizzy/presyncopal. A Doppler study of his lower extremities revealed severe arterial insufficiency with a right ABI 0.25 the left of 0.39. He had severe iliac and SFA disease. Carotid Dopplers were performed as well which revealed moderate right carotid disease and moderate to severe left. His Myoview stress test that showed inferoseptal ischemia thought to be "high risk". Because of this, I'm going to perform a catheterization the left femoral approach at the time of his peripheral angiogram   Current Outpatient Prescriptions  Medication Sig Dispense Refill  . clopidogrel (PLAVIX) 75 MG tablet Take 75 mg by mouth daily with breakfast.      . Flaxseed, Linseed, (FLAX SEED OIL) 1000 MG CAPS Take 2 capsules by mouth daily.      . Multiple Vitamin (MULTIVITAMIN WITH MINERALS) TABS tablet Take 1 tablet by mouth daily.      . nebivolol (BYSTOLIC) 5 MG tablet Take 5 mg by mouth daily.      . niacin-simvastatin (SIMCOR) 500-20 MG 24 hr tablet Take 1 tablet by  mouth at bedtime.       No current facility-administered medications for this visit.    No Known Allergies  History   Social History  . Marital Status: Married    Spouse Name: N/A    Number of Children: N/A  . Years of Education: N/A   Occupational History  . Not on file.   Social History Main Topics  . Smoking status: Former Smoker    Types: Cigarettes    Quit date: 07/03/2003  . Smokeless tobacco: Not on file  . Alcohol Use: 2.5 oz/week    5 drink(s) per week  . Drug Use: Not on file  . Sexual Activity: Not on file   Other Topics Concern  . Not on file   Social History Narrative  . No narrative on file     Review of Systems: General: negative for chills, fever, night sweats or weight changes.  Cardiovascular: negative for chest pain, dyspnea on exertion, edema, orthopnea, palpitations, paroxysmal nocturnal dyspnea or shortness of breath Dermatological: negative for rash Respiratory: negative for cough or wheezing Urologic: negative for hematuria Abdominal: negative for nausea, vomiting, diarrhea, bright red blood per rectum, melena, or hematemesis Neurologic: negative for visual changes, syncope, or dizziness All other systems reviewed and are otherwise negative except as noted above.    Blood pressure 132/62, pulse 54, height 5\' 11"  (1.803 m), weight 157 lb 9.6 oz (71.487 kg).  General appearance: alert and no distress Neck: no adenopathy, no JVD, supple, symmetrical, trachea midline, thyroid not enlarged,  symmetric, no tenderness/mass/nodules and soft left carotid bruit Lungs: clear to auscultation bilaterally Heart: regular rate and rhythm, S1, S2 normal, no murmur, click, rub or gallop Extremities: extremities normal, atraumatic, no cyanosis or edema  EKG not performed today  ASSESSMENT AND PLAN:   Coronary artery disease The patient has 3 vessel disease documented by cardiac catheterization which I performed 12/01/00. He is a 7580% proximal to mid  dominant RCA, 75% proximal large OM 1 branch stenosis and 50% mid LAD with normal LV function. At that time medical therapy was recommended. He has had progressive increasing dyspnea on exertion and fatigue. Recent Myoview stress test showed inferoseptal ischemia. Based on this, I'm going to perform cardiac catheterization at time of his upcoming vascular intervention/intervention to define his coronary anatomy.      Michael Harp MD FACP,FACC,FAHA, [facility] 08/04/2013 4:22 PM

## 2013-08-17 ENCOUNTER — Other Ambulatory Visit: Payer: Self-pay | Admitting: *Deleted

## 2013-08-17 ENCOUNTER — Encounter (HOSPITAL_COMMUNITY): Payer: Self-pay | Admitting: Pharmacy Technician

## 2013-08-17 DIAGNOSIS — I2510 Atherosclerotic heart disease of native coronary artery without angina pectoris: Secondary | ICD-10-CM

## 2013-08-17 DIAGNOSIS — Z01810 Encounter for preprocedural cardiovascular examination: Secondary | ICD-10-CM

## 2013-08-17 MED ORDER — ASPIRIN 81 MG PO CHEW: 81.0000 mg | CHEWABLE_TABLET | Freq: Every day | ORAL | Status: AC

## 2013-08-17 NOTE — Progress Notes (Signed)
Patient requested sleeping med and Ambien given per order patient tolerated well. Denies chest pain heart rate was low 37-50.  Bypass surgery education started with wife at bedside.

## 2013-08-17 NOTE — Progress Notes (Signed)
    Subjective: No complaints  Objective: Vital signs in last 24 hours: Temp:  [97.8 F (36.6 C)-99 F (37.2 C)] 98.2 F (36.8 C) (04/28 0443) Pulse Rate:  [47-54] 50 (04/28 0443) Resp:  [15-20] 20 (04/28 0443) BP: (130-147)/(42-63) 130/42 mmHg (04/28 0443) SpO2:  [97 %-100 %] 98 % (04/28 0443) Weight:  [155 lb (70.308 kg)-155 lb 13.8 oz (70.7 kg)] 155 lb 13.8 oz (70.7 kg) (04/28 0059) Last BM Date: 08/15/13  Intake/Output from previous day: 04/27 0701 - 04/28 0700 In: 120 [P.O.:120] Out: 900 [Urine:900] Intake/Output this shift:    Medications Current Facility-Administered Medications  Medication Dose Route Frequency Provider Last Rate Last Dose  . aspirin chewable tablet 81 mg  81 mg Oral Daily Lorretta Harp, MD      . ibuprofen (ADVIL,MOTRIN) tablet 400 mg  400 mg Oral Q8H PRN Lorretta Harp, MD      . morphine 2 MG/ML injection 2 mg  2 mg Intravenous Q1H PRN Lorretta Harp, MD      . nebivolol (BYSTOLIC) tablet 5 mg  5 mg Oral Daily Rogelia Mire, NP      . pneumococcal 23 valent vaccine (PNU-IMMUNE) injection 0.5 mL  0.5 mL Intramuscular Tomorrow-1000 Lorretta Harp, MD      . zolpidem Lorrin Mais) tablet 5 mg  5 mg Oral QHS PRN Larey Dresser, MD   5 mg at 08/16/13 2137    PE: General appearance: alert, cooperative and no distress Lungs: clear to auscultation bilaterally Heart: reg rhythm.  Rate slow.  No MM Extremities: No LEE Pulses: decreased pedal pulses.  2 radials. Skin: Warm and dry Neurologic: Grossly normal    Assessment/Plan   Active Problems:   CAD (coronary artery disease)  Plan:   SP Left heart cath revealing severe three vessel disease.  CABG is planned for 08/23/13 after plavix washout.  He also had a PV angiogram which revealed 60% proximal left subclavian, 60% proximal segmental left common iliac artery stenosis. The previously placed left external iliac artery stent was patent. The left external iliac artery and common femoral  artery were diffusely diseased with a focal 80-90% stenosis, the right common and external iliac artery were occluded. The right common femoral artery filled by collaterals as did the profunda femoris. The right SFA was occluded at its origin with reconstitution in the adductor canal. There was three-vessel runoff.    He is bradycardic.  BP stable.  DC home today.    LOS: 1 day    Tarri Fuller PA-C 08/17/2013 7:32 AM     Patient seen and examined. Agree with assessment and plan. No angina. Plavix on hold for CABG next week. No chest discomfort with ambulation. Would resume low dose statin. Cath site stable.   Troy Sine, MD, [facility] 08/17/2013 7:54 AM

## 2013-08-17 NOTE — Progress Notes (Signed)
CARDIAC REHAB PHASE I   PRE:  Rate/Rhythm: 54 SB  BP:  Supine: 121/51  Sitting:   Standing:    SaO2:   MODE:  Ambulation: 450 ft   POST:  Rate/Rhythm: 59 SB  BP:  Supine:   Sitting: 114/68  Standing:    SaO2:  0740-0811 Pt walked 450 ft with steady gait. Tolerated without CP. Did c/o slight discomfort in hips from lying in bed. Education completed re mobility after surgery and sternal precautions. Asked pt's RN to get pt IS as he does not have one. He has watched preop video, has [facility] booklet and I gave care guide. Pt states he has not been smoking. We will follow up after surgery.   Graylon Good, RN BSN  08/17/2013 8:09 AM

## 2013-08-17 NOTE — Plan of Care (Signed)
Problem: Consults Goal: Cardiac Rehab Consult Outcome: Completed/Met Date Met:  08/17/13 Cardiac rehab into see patient and reviewed information pre cabg with patient see note.  Problem: Phase I - Pre-Op Goal: Pre-Op Education completed Outcome: Completed/Met Date Met:  08/17/13 Patient viewed cardiac surgery videos, Preparing for cardiac surgery and Recovery from cardiac surgery. Given booklet for cardiac surgery and information reviewed with Doug Sou. Incentive spirometer given to patient, purpose and use reviewed and patient used I/S appropriately up to 2375 mls.

## 2013-08-17 NOTE — Discharge Summary (Signed)
Physician Discharge Summary     Patient ID: Michael Maddox MRN: [id number] DOB/AGE: 1950-05-15 63 y.o.  Admit date: 08/16/2013 Discharge date: 08/17/2013  Admission Diagnoses:  CAD/PVD  Discharge Diagnoses:  Active Problems:   Peripheral arterial disease   Hyperlipidemia   Coronary artery disease   CAD (coronary artery disease)     Discharged Condition: stable  Hospital Course:   Michael Maddox is a 63 year old appearing married African American male for a father of 3 children, grandfather of 2 grandchildren who is a patient of Dr. Christean Grief Avebuere's.  He has a history of peripheral arterial disease status post left iliac and bilateral SFA intervention most recently in 2005 by Dr. [name].  He discontinued tobacco abuse at that time. He also has  hypertension and hyperlipidemia. He had right carotid endarterectomy performed by Dr. [name] in November 2010 for high-grade disease in the setting of a stroke. He denies chest pain but does get somewhat shortness of breath which has not changed. His last functional study was performed 12 years ago and was unremarkable. The palpitations are fairly recent onset. He can "hear his heartbeat in his ears" and does feel somewhat dizzy/presyncopal. A Doppler study of his lower extremities revealed severe arterial insufficiency with a right ABI 0.25 the left of 0.39. He had severe iliac and SFA disease. Carotid Dopplers were performed as well which revealed moderate right carotid disease and moderate to severe left. His Myoview stress test that showed inferoseptal ischemia thought to be "high risk". Because of this,Dr. [name] performed a catheterization the left femoral approach at the time of his peripheral angiogram.  The patient underwent a left heart cath and PV angiogram.  He has severe three vessel CAD.  CTS consulted and scheduled CABG for 08/23/13 after plavix washes out.  He is on a low dose of bystolic and statin will be added.  His peripheral vascular  disease will be addressed after Surgery.  The patient was seen by Dr. Claiborne Billings who felt he was stable for DC home.   Consults: CTS  Significant Diagnostic Studies:  Left heart cath  HEMODYNAMICS:  AO SYSTOLIC/AO DIASTOLIC: 751/02  ANGIOGRAPHIC RESULTS:  1. Left main; normal  2. LAD; 95% [place] to mid at a diagonal branch takeoff  3. Left circumflex; 75% segmental proximal OM1.  4. 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 Left ventriculography;not performed today  IMPRESSION:Michael Maddox has 3 vessel disease and an abnormal Myoview. He'll need CABG. He does not have unstable angina. He is on Plavix. I believe that he can be discharged after being seen by the cardiothoracic surgeons for a Plavix washout before his CABG. The sheath was removed and pressure was held on the groin to achieve hemostasis. The patient left the Cath Lab in stable condition.  Lorretta Harp. MD, [employer]  08/16/2013  11:35 AM  PV angiogram Angiographic Data:  1: Left subclavian artery-60% proximal left subclavian artery stenosis with a 20 mm pullback gradient  2: Left lower extremity-60% proximal segmental left common iliac artery stenosis. The previously placed left external iliac artery stent was patent. The left external iliac artery and common femoral artery were diffusely diseased with a focal 80-90% stenosis. The SFA was occluded at its origin with reconstitution in the adductor canal by profunda femoris collaterals. There was three-vessel runoff.  3: Right lower  extremity-the right common and external iliac artery were occluded. The right common femoral artery filled by collaterals as did the profunda femoris. The right SFA was occluded at its origin with reconstitution in the adductor canal. There was three-vessel runoff.    IMPRESSION:Michael Maddox will need coronary artery bypass grafting for his three-vessel disease prior to addressing his lower extremity arterial occlusive disease. I believe the best form of therapy would be aortobifemoral bypass grafting. The sheath was removed and pressure was held on the groin to achieve hemostasis. The patient left the Cath Lab in stable condition.  Lorretta Harp. MD, [facility]  08/16/2013  11:40 AM  Lipid Panel     Component Value Date/Time   CHOL  Value: 127        ATP III CLASSIFICATION:  <200     mg/dL   Desirable  200-239  mg/dL   Borderline High  >=240    mg/dL   High        02/23/2009 0550   TRIG 102 02/23/2009 0550   HDL 24* 02/23/2009 0550   CHOLHDL 5.3 02/23/2009 0550   VLDL 20 02/23/2009 0550   LDLCALC  Value: 83        Total Cholesterol/HDL:CHD Risk Coronary Heart Disease Risk Table                     Men   Women  1/2 Average Risk   3.4   3.3  Average Risk       5.0   4.4  2 X Average Risk   9.6   7.1  3 X Average Risk  23.4   11.0        Use the calculated Patient Ratio above and the CHD Risk Table to determine the patient's CHD Risk.        ATP III CLASSIFICATION (LDL):  <100     mg/dL   Optimal  100-129  mg/dL   Near or Above                    Optimal  130-159  mg/dL   Borderline  160-189  mg/dL   High  >190     mg/dL   Very High 02/23/2009 0550     Treatments: See Above  Discharge Exam: Blood pressure 121/51, pulse 50, temperature 98 F (36.7 C), temperature source Oral, resp. rate 20, height 5\' 11"  (1.803 m), weight 155 lb 13.8 oz (70.7 kg), SpO2 98.00%.   Disposition:      Medication List    STOP taking these medications       clopidogrel 75 MG tablet  Commonly known as:  PLAVIX     ibuprofen 200 MG tablet  Commonly known as:  ADVIL,MOTRIN      TAKE these medications       aspirin 81 MG chewable tablet  Chew 1 tablet (81 mg total) by mouth daily.     Flax Seed Oil 1000 MG Caps  Take 2 capsules by mouth daily.     multivitamin with minerals  Tabs tablet  Take 1 tablet by mouth daily.     nebivolol 5 MG tablet  Commonly known as:  BYSTOLIC  Take 5 mg by mouth daily.     niacin-simvastatin 500-20 MG 24 hr tablet  Commonly known as:  SIMCOR  Take 1 tablet by mouth at bedtime.     vitamin C 500 MG tablet  Commonly known as:  ASCORBIC ACID  Take 500  mg by mouth daily.         Signed: Tarri Fuller 08/17/2013, 8:55 AM

## 2013-08-18 ENCOUNTER — Encounter (HOSPITAL_COMMUNITY): Payer: BC Managed Care – PPO

## 2013-08-19 ENCOUNTER — Encounter (HOSPITAL_COMMUNITY): Payer: Self-pay

## 2013-08-19 ENCOUNTER — Encounter (HOSPITAL_COMMUNITY)
Admit: 2013-08-19 | Discharge: 2013-08-19 | Disposition: A | Discharge status: Home / Self Care | Payer: BC Managed Care – PPO | Attending: Thoracic Surgery (Cardiothoracic Vascular Surgery) | Admitting: Thoracic Surgery (Cardiothoracic Vascular Surgery)

## 2013-08-19 VITALS — BP 123/60 | HR 51 | Temp 98.0°F | Resp 20 | Ht 72.0 in | Wt 156.7 lb

## 2013-08-19 DIAGNOSIS — Z01812 Encounter for preprocedural laboratory examination: Secondary | ICD-10-CM | POA: Insufficient documentation

## 2013-08-19 DIAGNOSIS — I2510 Atherosclerotic heart disease of native coronary artery without angina pectoris: Secondary | ICD-10-CM

## 2013-08-19 LAB — BLOOD GAS, ARTERIAL
Acid-base deficit: 1 mmol/L (ref 0.0–2.0)
BICARBONATE: 21.7 meq/L (ref 20.0–24.0)
Drawn by: 181601
FIO2: 0.21 %
O2 Saturation: 99.7 %
PH ART: 7.507 — AB (ref 7.350–7.450)
Patient temperature: 98.6
TCO2: 22.6 mmol/L (ref 0–100)
pCO2 arterial: 27.6 mmHg — ABNORMAL LOW (ref 35.0–45.0)
pO2, Arterial: 129 mmHg — ABNORMAL HIGH (ref 80.0–100.0)

## 2013-08-19 LAB — SURGICAL PCR SCREEN
MRSA, PCR: NEGATIVE
STAPHYLOCOCCUS AUREUS: NEGATIVE

## 2013-08-19 LAB — CBC
HEMATOCRIT: 41.1 % (ref 39.0–52.0)
Hemoglobin: 13.6 g/dL (ref 13.0–17.0)
MCH: 29.6 pg (ref 26.0–34.0)
MCHC: 33.1 g/dL (ref 30.0–36.0)
MCV: 89.3 fL (ref 78.0–100.0)
Platelets: 205 10*3/uL (ref 150–400)
RBC: 4.6 MIL/uL (ref 4.22–5.81)
RDW: 12.7 % (ref 11.5–15.5)
WBC: 5.7 10*3/uL (ref 4.0–10.5)

## 2013-08-19 LAB — URINALYSIS, ROUTINE W REFLEX MICROSCOPIC
Bilirubin Urine: NEGATIVE
Glucose, UA: NEGATIVE mg/dL
Hgb urine dipstick: NEGATIVE
KETONES UR: NEGATIVE mg/dL
Leukocytes, UA: NEGATIVE
Nitrite: NEGATIVE
PROTEIN: NEGATIVE mg/dL
Specific Gravity, Urine: 1.025 (ref 1.005–1.030)
Urobilinogen, UA: 1 mg/dL (ref 0.0–1.0)
pH: 5 (ref 5.0–8.0)

## 2013-08-19 LAB — COMPREHENSIVE METABOLIC PANEL
ALBUMIN: 4.4 g/dL (ref 3.5–5.2)
ALK PHOS: 59 U/L (ref 39–117)
ALT: 25 U/L (ref 0–53)
AST: 27 U/L (ref 0–37)
BUN: 17 mg/dL (ref 6–23)
CHLORIDE: 101 meq/L (ref 96–112)
CO2: 18 mEq/L — ABNORMAL LOW (ref 19–32)
Calcium: 9.5 mg/dL (ref 8.4–10.5)
Creatinine, Ser: 0.85 mg/dL (ref 0.50–1.35)
GFR calc Af Amer: >90 mL/min (ref >90)
GFR calc non Af Amer: >90 mL/min (ref >90)
Glucose, Bld: 110 mg/dL — ABNORMAL HIGH (ref 70–99)
POTASSIUM: 4.5 meq/L (ref 3.7–5.3)
SODIUM: 137 meq/L (ref 137–147)
Total Bilirubin: 0.5 mg/dL (ref 0.3–1.2)
Total Protein: 7.7 g/dL (ref 6.0–8.3)

## 2013-08-19 LAB — PULMONARY FUNCTION TEST
DL/VA % pred: 83 %
DL/VA: 3.9 ml/min/mmHg/L
DLCO UNC % PRED: 46 %
DLCO UNC: 15.56 ml/min/mmHg
DLCO cor % pred: 47 %
DLCO cor: 15.84 ml/min/mmHg
FEF 25-75 Pre: 2.13 L/sec
FEF2575-%Pred-Pre: 73 %
FEV1-%Pred-Pre: 102 %
FEV1-PRE: 3.26 L
FEV1FVC-%Pred-Pre: 91 %
FEV6-%PRED-PRE: 113 %
FEV6-Pre: 4.49 L
FEV6FVC-%PRED-PRE: 102 %
FVC-%Pred-Pre: 110 %
FVC-Pre: 4.56 L
Pre FEV1/FVC ratio: 72 %
Pre FEV6/FVC Ratio: 99 %
RV % PRED: 164 %
RV: 3.87 L
TLC % pred: 111 %
TLC: 8.05 L

## 2013-08-19 LAB — TYPE AND SCREEN
ABO/RH(D): A POS
ANTIBODY SCREEN: NEGATIVE

## 2013-08-19 LAB — PROTIME-INR
INR: 0.94 (ref 0.00–1.49)
Prothrombin Time: 12.4 seconds (ref 11.6–15.2)

## 2013-08-19 LAB — HEMOGLOBIN A1C
HEMOGLOBIN A1C: 6.2 % — AB (ref <5.7)
Mean Plasma Glucose: 131 mg/dL — ABNORMAL HIGH (ref <117)

## 2013-08-19 LAB — APTT: aPTT: 33 seconds (ref 24–37)

## 2013-08-19 MED ORDER — CHLORHEXIDINE GLUCONATE 4 % EX LIQD: 30.0000 mL | CUTANEOUS | Status: DC

## 2013-08-19 NOTE — Pre-Procedure Instructions (Signed)
Michael Maddox  [birth year]   Your procedure is scheduled on:  Monday, Aug 23, 2013  Report to [facility] Entrance "A" [address] at [employer] AM.  Call this number if you have problems the morning of surgery: [phone]   Remember:   Do not eat food or drink liquids after midnight.   Take these medicines the morning of surgery with A SIP OF WATER: Pt reports taking Bystolic at night and was instructed to do so.  STOP taking Aspirin and Plavix as directed by MD. Stop Goody's, BC's, Flax Seed Oil, Aleve (Naproxen), Ibuprofen (Advil or Motrin), Fish Oil, Vitamins, Herbal Supplements or any substance that could thin your blood starting today, August 19, 2013.   Do not wear jewelry.  Do not wear lotions, powders, or perfumes. You may wear deodorant.  Do not shave 48 hours prior to surgery. Men may shave face and neck.  Do not bring valuables to the hospital.  [facility] is not responsible                  for any belongings or valuables.               Contacts, dentures or bridgework may not be worn into surgery.  Leave suitcase in the car. After surgery it may be brought to your room.  For patients admitted to the hospital, discharge time is determined by your                treatment team.                 Special Instructions: Please use CHG soap the night before surgery and the day of surgery. CHG soap should be used atleast twice.   Please read over the following fact sheets that you were given: Pain Booklet, Coughing and Deep Breathing, Blood Transfusion Information, MRSA Information and Surgical Site Infection Prevention--pt reports receiving heart booklet.

## 2013-08-22 MED ORDER — SODIUM CHLORIDE 0.9 % IV SOLN
INTRAVENOUS | Status: AC
  Administered 2013-08-23: 70 mL/h via INTRAVENOUS
  Filled 2013-08-22: qty 40

## 2013-08-22 MED ORDER — DEXTROSE 5 % IV SOLN
30.0000 ug/min | INTRAVENOUS | Status: DC
  Filled 2013-08-22: qty 2

## 2013-08-22 MED ORDER — DEXTROSE 5 % IV SOLN
1.5000 g | INTRAVENOUS | Status: AC
  Administered 2013-08-23: 1.5 g via INTRAVENOUS
  Administered 2013-08-23: .75 g via INTRAVENOUS
  Filled 2013-08-22: qty 1.5

## 2013-08-22 MED ORDER — METOPROLOL TARTRATE 12.5 MG HALF TABLET: 12.5000 mg | ORAL_TABLET | Freq: Once | ORAL | Status: DC

## 2013-08-22 MED ORDER — EPINEPHRINE HCL 1 MG/ML IJ SOLN
0.5000 ug/min | INTRAVENOUS | Status: DC
  Filled 2013-08-22: qty 4

## 2013-08-22 MED ORDER — PLASMA-LYTE 148 IV SOLN
INTRAVENOUS | Status: AC
  Administered 2013-08-23: 09:00:00
  Filled 2013-08-22: qty 2.5

## 2013-08-22 MED ORDER — POTASSIUM CHLORIDE 2 MEQ/ML IV SOLN
80.0000 meq | INTRAVENOUS | Status: DC
  Filled 2013-08-22: qty 40

## 2013-08-22 MED ORDER — VANCOMYCIN HCL 10 G IV SOLR
1250.0000 mg | INTRAVENOUS | Status: AC
  Administered 2013-08-23: 1250 mg via INTRAVENOUS
  Filled 2013-08-22: qty 1250

## 2013-08-22 MED ORDER — SODIUM CHLORIDE 0.9 % IV SOLN
INTRAVENOUS | Status: DC
  Filled 2013-08-22: qty 30

## 2013-08-22 MED ORDER — MAGNESIUM SULFATE 50 % IJ SOLN
40.0000 meq | INTRAMUSCULAR | Status: DC
  Filled 2013-08-22: qty 10

## 2013-08-22 MED ORDER — DOPAMINE-DEXTROSE 3.2-5 MG/ML-% IV SOLN
2.0000 ug/kg/min | INTRAVENOUS | Status: DC
  Filled 2013-08-22: qty 250

## 2013-08-22 MED ORDER — NITROGLYCERIN IN D5W 200-5 MCG/ML-% IV SOLN
2.0000 ug/min | INTRAVENOUS | Status: AC
  Administered 2013-08-23: 16 ug/min via INTRAVENOUS
  Filled 2013-08-22: qty 250

## 2013-08-22 MED ORDER — DEXMEDETOMIDINE HCL IN NACL 400 MCG/100ML IV SOLN
0.1000 ug/kg/h | INTRAVENOUS | Status: AC
  Administered 2013-08-23: 0.2 ug/kg/h via INTRAVENOUS
  Filled 2013-08-22: qty 100

## 2013-08-22 MED ORDER — DEXTROSE 5 % IV SOLN
750.0000 mg | INTRAVENOUS | Status: DC
  Filled 2013-08-22: qty 750

## 2013-08-22 MED ORDER — SODIUM CHLORIDE 0.9 % IV SOLN
INTRAVENOUS | Status: AC
  Administered 2013-08-23: 1 [IU]/h via INTRAVENOUS
  Filled 2013-08-22: qty 1

## 2013-08-23 ENCOUNTER — Encounter (HOSPITAL_COMMUNITY)
Admission: RE | Disposition: A | Discharge status: Home / Self Care | Payer: BC Managed Care – PPO | Source: Ambulatory Visit | Attending: Thoracic Surgery (Cardiothoracic Vascular Surgery)

## 2013-08-23 ENCOUNTER — Encounter (HOSPITAL_COMMUNITY): Payer: BC Managed Care – PPO | Admitting: Certified Registered"

## 2013-08-23 ENCOUNTER — Inpatient Hospital Stay (HOSPITAL_COMMUNITY): Payer: BC Managed Care – PPO | Admitting: Certified Registered"

## 2013-08-23 ENCOUNTER — Inpatient Hospital Stay (HOSPITAL_COMMUNITY): Payer: BC Managed Care – PPO

## 2013-08-23 ENCOUNTER — Inpatient Hospital Stay (HOSPITAL_COMMUNITY)
Admission: RE | Admit: 2013-08-23 | Discharge: 2013-08-29 | DRG: 236 | Disposition: A | Discharge status: Home / Self Care | Payer: BC Managed Care – PPO | Source: Ambulatory Visit | Attending: Thoracic Surgery (Cardiothoracic Vascular Surgery) | Admitting: Thoracic Surgery (Cardiothoracic Vascular Surgery)

## 2013-08-23 ENCOUNTER — Encounter (HOSPITAL_COMMUNITY): Payer: Self-pay | Admitting: *Deleted

## 2013-08-23 DIAGNOSIS — S0180XA Unspecified open wound of other part of head, initial encounter: Secondary | ICD-10-CM | POA: Diagnosis not present

## 2013-08-23 DIAGNOSIS — Z7902 Long term (current) use of antithrombotics/antiplatelets: Secondary | ICD-10-CM

## 2013-08-23 DIAGNOSIS — I2510 Atherosclerotic heart disease of native coronary artery without angina pectoris: Secondary | ICD-10-CM

## 2013-08-23 DIAGNOSIS — I743 Embolism and thrombosis of arteries of the lower extremities: Secondary | ICD-10-CM | POA: Diagnosis present

## 2013-08-23 DIAGNOSIS — Z951 Presence of aortocoronary bypass graft: Secondary | ICD-10-CM

## 2013-08-23 DIAGNOSIS — Y9210 Unspecified residential institution as the place of occurrence of the external cause: Secondary | ICD-10-CM | POA: Diagnosis present

## 2013-08-23 DIAGNOSIS — E785 Hyperlipidemia, unspecified: Secondary | ICD-10-CM | POA: Diagnosis present

## 2013-08-23 DIAGNOSIS — F17200 Nicotine dependence, unspecified, uncomplicated: Secondary | ICD-10-CM | POA: Diagnosis present

## 2013-08-23 DIAGNOSIS — K219 Gastro-esophageal reflux disease without esophagitis: Secondary | ICD-10-CM | POA: Diagnosis present

## 2013-08-23 DIAGNOSIS — E8779 Other fluid overload: Secondary | ICD-10-CM | POA: Diagnosis present

## 2013-08-23 DIAGNOSIS — I10 Essential (primary) hypertension: Secondary | ICD-10-CM | POA: Diagnosis present

## 2013-08-23 DIAGNOSIS — W06XXXA Fall from bed, initial encounter: Secondary | ICD-10-CM | POA: Diagnosis not present

## 2013-08-23 DIAGNOSIS — Z8673 Personal history of transient ischemic attack (TIA), and cerebral infarction without residual deficits: Secondary | ICD-10-CM

## 2013-08-23 DIAGNOSIS — I6529 Occlusion and stenosis of unspecified carotid artery: Secondary | ICD-10-CM | POA: Diagnosis present

## 2013-08-23 DIAGNOSIS — Z79899 Other long term (current) drug therapy: Secondary | ICD-10-CM

## 2013-08-23 DIAGNOSIS — D62 Acute posthemorrhagic anemia: Secondary | ICD-10-CM | POA: Diagnosis not present

## 2013-08-23 HISTORY — DX: Presence of aortocoronary bypass graft: Z95.1

## 2013-08-23 HISTORY — PX: CORONARY ARTERY BYPASS GRAFT: SHX141

## 2013-08-23 HISTORY — PX: INTRAOPERATIVE TRANSESOPHAGEAL ECHOCARDIOGRAM: SHX5062

## 2013-08-23 LAB — POCT I-STAT 3, ART BLOOD GAS (G3+)
ACID-BASE DEFICIT: 1 mmol/L (ref 0.0–2.0)
ACID-BASE DEFICIT: 2 mmol/L (ref 0.0–2.0)
ACID-BASE DEFICIT: 4 mmol/L — AB (ref 0.0–2.0)
ACID-BASE DEFICIT: 3 mmol/L — AB (ref 0.0–2.0)
BICARBONATE: 23.6 meq/L (ref 20.0–24.0)
BICARBONATE: 22.7 meq/L (ref 20.0–24.0)
BICARBONATE: 22.7 meq/L (ref 20.0–24.0)
Bicarbonate: 25.8 mEq/L — ABNORMAL HIGH (ref 20.0–24.0)
Bicarbonate: 22.8 mEq/L (ref 20.0–24.0)
O2 SAT: 99 %
O2 Saturation: 100 %
O2 Saturation: 100 %
O2 Saturation: 99 %
O2 Saturation: 98 %
PCO2 ART: 46.6 mmHg — AB (ref 35.0–45.0)
PCO2 ART: 33.8 mmHg — AB (ref 35.0–45.0)
PH ART: 7.351 (ref 7.350–7.450)
PH ART: 7.431 (ref 7.350–7.450)
PH ART: 7.305 — AB (ref 7.350–7.450)
PH ART: 7.308 — AB (ref 7.350–7.450)
PO2 ART: 316 mmHg — AB (ref 80.0–100.0)
PO2 ART: 152 mmHg — AB (ref 80.0–100.0)
PO2 ART: 120 mmHg — AB (ref 80.0–100.0)
Patient temperature: 35.7
Patient temperature: 35
Patient temperature: 37.2
Patient temperature: 37.1
TCO2: 27 mmol/L (ref 0–100)
TCO2: 25 mmol/L (ref 0–100)
TCO2: 24 mmol/L (ref 0–100)
TCO2: 24 mmol/L (ref 0–100)
TCO2: 24 mmol/L (ref 0–100)
pCO2 arterial: 35 mmHg (ref 35.0–45.0)
pCO2 arterial: 45.7 mmHg — ABNORMAL HIGH (ref 35.0–45.0)
pCO2 arterial: 45.6 mmHg — ABNORMAL HIGH (ref 35.0–45.0)
pH, Arterial: 7.426 (ref 7.350–7.450)
pO2, Arterial: 372 mmHg — ABNORMAL HIGH (ref 80.0–100.0)
pO2, Arterial: 154 mmHg — ABNORMAL HIGH (ref 80.0–100.0)

## 2013-08-23 LAB — POCT I-STAT 4, (NA,K, GLUC, HGB,HCT)
GLUCOSE: 136 mg/dL — AB (ref 70–99)
GLUCOSE: 132 mg/dL — AB (ref 70–99)
GLUCOSE: 120 mg/dL — AB (ref 70–99)
Glucose, Bld: 104 mg/dL — ABNORMAL HIGH (ref 70–99)
Glucose, Bld: 129 mg/dL — ABNORMAL HIGH (ref 70–99)
Glucose, Bld: 109 mg/dL — ABNORMAL HIGH (ref 70–99)
HCT: 31 % — ABNORMAL LOW (ref 39.0–52.0)
HCT: 23 % — ABNORMAL LOW (ref 39.0–52.0)
HCT: 25 % — ABNORMAL LOW (ref 39.0–52.0)
HEMATOCRIT: 37 % — AB (ref 39.0–52.0)
HEMATOCRIT: 24 % — AB (ref 39.0–52.0)
HEMATOCRIT: 29 % — AB (ref 39.0–52.0)
HEMOGLOBIN: 8.2 g/dL — AB (ref 13.0–17.0)
HEMOGLOBIN: 7.8 g/dL — AB (ref 13.0–17.0)
HEMOGLOBIN: 9.9 g/dL — AB (ref 13.0–17.0)
Hemoglobin: 12.6 g/dL — ABNORMAL LOW (ref 13.0–17.0)
Hemoglobin: 10.5 g/dL — ABNORMAL LOW (ref 13.0–17.0)
Hemoglobin: 8.5 g/dL — ABNORMAL LOW (ref 13.0–17.0)
POTASSIUM: 4.2 meq/L (ref 3.7–5.3)
Potassium: 4.1 mEq/L (ref 3.7–5.3)
Potassium: 4 mEq/L (ref 3.7–5.3)
Potassium: 5.7 mEq/L — ABNORMAL HIGH (ref 3.7–5.3)
Potassium: 4.7 mEq/L (ref 3.7–5.3)
Potassium: 3.4 mEq/L — ABNORMAL LOW (ref 3.7–5.3)
SODIUM: 135 meq/L — AB (ref 137–147)
SODIUM: 140 meq/L (ref 137–147)
Sodium: 141 mEq/L (ref 137–147)
Sodium: 142 mEq/L (ref 137–147)
Sodium: 137 mEq/L (ref 137–147)
Sodium: 135 mEq/L — ABNORMAL LOW (ref 137–147)

## 2013-08-23 LAB — GLUCOSE, CAPILLARY
GLUCOSE-CAPILLARY: 105 mg/dL — AB (ref 70–99)
GLUCOSE-CAPILLARY: 109 mg/dL — AB (ref 70–99)
Glucose-Capillary: 104 mg/dL — ABNORMAL HIGH (ref 70–99)
Glucose-Capillary: 93 mg/dL (ref 70–99)
Glucose-Capillary: 105 mg/dL — ABNORMAL HIGH (ref 70–99)
Glucose-Capillary: 116 mg/dL — ABNORMAL HIGH (ref 70–99)
Glucose-Capillary: 102 mg/dL — ABNORMAL HIGH (ref 70–99)
Glucose-Capillary: 161 mg/dL — ABNORMAL HIGH (ref 70–99)

## 2013-08-23 LAB — CREATININE, SERUM
Creatinine, Ser: 0.85 mg/dL (ref 0.50–1.35)
GFR calc non Af Amer: >90 mL/min (ref >90)

## 2013-08-23 LAB — POCT I-STAT, CHEM 8
BUN: 9 mg/dL (ref 6–23)
CALCIUM ION: 1.19 mmol/L (ref 1.13–1.30)
Chloride: 106 mEq/L (ref 96–112)
Creatinine, Ser: 0.9 mg/dL (ref 0.50–1.35)
Glucose, Bld: 120 mg/dL — ABNORMAL HIGH (ref 70–99)
HEMATOCRIT: 30 % — AB (ref 39.0–52.0)
Hemoglobin: 10.2 g/dL — ABNORMAL LOW (ref 13.0–17.0)
Potassium: 5.1 mEq/L (ref 3.7–5.3)
SODIUM: 142 meq/L (ref 137–147)
TCO2: 23 mmol/L (ref 0–100)

## 2013-08-23 LAB — HEMOGLOBIN AND HEMATOCRIT, BLOOD
HCT: 23.4 % — ABNORMAL LOW (ref 39.0–52.0)
Hemoglobin: 7.9 g/dL — ABNORMAL LOW (ref 13.0–17.0)

## 2013-08-23 LAB — APTT: aPTT: 39 seconds — ABNORMAL HIGH (ref 24–37)

## 2013-08-23 LAB — CBC
HCT: 28.7 % — ABNORMAL LOW (ref 39.0–52.0)
HCT: 30 % — ABNORMAL LOW (ref 39.0–52.0)
HEMOGLOBIN: 9.7 g/dL — AB (ref 13.0–17.0)
Hemoglobin: 9.9 g/dL — ABNORMAL LOW (ref 13.0–17.0)
MCH: 30.2 pg (ref 26.0–34.0)
MCH: 29.5 pg (ref 26.0–34.0)
MCHC: 33.8 g/dL (ref 30.0–36.0)
MCHC: 33 g/dL (ref 30.0–36.0)
MCV: 89.4 fL (ref 78.0–100.0)
MCV: 89.3 fL (ref 78.0–100.0)
Platelets: 123 10*3/uL — ABNORMAL LOW (ref 150–400)
Platelets: 134 10*3/uL — ABNORMAL LOW (ref 150–400)
RBC: 3.21 MIL/uL — AB (ref 4.22–5.81)
RBC: 3.36 MIL/uL — AB (ref 4.22–5.81)
RDW: 12.7 % (ref 11.5–15.5)
RDW: 12.7 % (ref 11.5–15.5)
WBC: 6.7 10*3/uL (ref 4.0–10.5)
WBC: 9.8 10*3/uL (ref 4.0–10.5)

## 2013-08-23 LAB — PLATELET COUNT: Platelets: 122 K/uL — ABNORMAL LOW (ref 150–400)

## 2013-08-23 LAB — MAGNESIUM: MAGNESIUM: 3.1 mg/dL — AB (ref 1.5–2.5)

## 2013-08-23 LAB — PROTIME-INR
INR: 1.38 (ref 0.00–1.49)
PROTHROMBIN TIME: 16.6 s — AB (ref 11.6–15.2)

## 2013-08-23 SURGERY — CORONARY ARTERY BYPASS GRAFTING (CABG)
Anesthesia: General | Site: Chest

## 2013-08-23 MED ORDER — ALBUMIN HUMAN 5 % IV SOLN
INTRAVENOUS | Status: DC | PRN
  Administered 2013-08-23: 12:00:00 via INTRAVENOUS

## 2013-08-23 MED ORDER — VECURONIUM BROMIDE 10 MG IV SOLR
INTRAVENOUS | Status: AC
  Filled 2013-08-23: qty 20

## 2013-08-23 MED ORDER — MIDAZOLAM HCL 10 MG/2ML IJ SOLN
INTRAMUSCULAR | Status: AC
  Filled 2013-08-23: qty 2

## 2013-08-23 MED ORDER — SODIUM CHLORIDE 0.9 % IV SOLN
INTRAVENOUS | Status: DC
  Administered 2013-08-23: 1.1 [IU]/h via INTRAVENOUS
  Filled 2013-08-23: qty 1

## 2013-08-23 MED ORDER — PROTAMINE SULFATE 10 MG/ML IV SOLN
INTRAVENOUS | Status: AC
  Filled 2013-08-23: qty 10

## 2013-08-23 MED ORDER — DEXTROSE 5 % IV SOLN
1.5000 g | Freq: Two times a day (BID) | INTRAVENOUS | Status: AC
  Administered 2013-08-23 – 2013-08-25 (×4): 1.5 g via INTRAVENOUS
  Filled 2013-08-23 (×4): qty 1.5

## 2013-08-23 MED ORDER — SODIUM CHLORIDE 0.9 % IJ SOLN
3.0000 mL | INTRAMUSCULAR | Status: DC | PRN
  Administered 2013-08-24: 3 mL via INTRAVENOUS

## 2013-08-23 MED ORDER — MIDAZOLAM HCL 2 MG/2ML IJ SOLN
INTRAMUSCULAR | Status: AC
  Filled 2013-08-23: qty 2

## 2013-08-23 MED ORDER — FENTANYL CITRATE 0.05 MG/ML IJ SOLN
INTRAMUSCULAR | Status: AC
  Filled 2013-08-23: qty 2

## 2013-08-23 MED ORDER — LACTATED RINGERS IV SOLN
INTRAVENOUS | Status: DC | PRN
  Administered 2013-08-23: 07:00:00 via INTRAVENOUS

## 2013-08-23 MED ORDER — SODIUM CHLORIDE 0.9 % IV SOLN: INTRAVENOUS | Status: DC

## 2013-08-23 MED ORDER — OXYCODONE HCL 5 MG PO TABS
5.0000 mg | ORAL_TABLET | ORAL | Status: DC | PRN
  Administered 2013-08-23 – 2013-08-25 (×7): 10 mg via ORAL
  Filled 2013-08-23 (×7): qty 2

## 2013-08-23 MED ORDER — PROTAMINE SULFATE 10 MG/ML IV SOLN
INTRAVENOUS | Status: DC | PRN
  Administered 2013-08-23: 200 mg via INTRAVENOUS

## 2013-08-23 MED ORDER — ACETAMINOPHEN 650 MG RE SUPP
650.0000 mg | Freq: Once | RECTAL | Status: AC
  Administered 2013-08-23: 650 mg via RECTAL

## 2013-08-23 MED ORDER — BISACODYL 5 MG PO TBEC
10.0000 mg | DELAYED_RELEASE_TABLET | Freq: Every day | ORAL | Status: DC
  Administered 2013-08-24 – 2013-08-28 (×5): 10 mg via ORAL
  Filled 2013-08-23 (×5): qty 2

## 2013-08-23 MED ORDER — PROPOFOL 10 MG/ML IV BOLUS
INTRAVENOUS | Status: AC
  Filled 2013-08-23: qty 20

## 2013-08-23 MED ORDER — FENTANYL CITRATE 0.05 MG/ML IJ SOLN
INTRAMUSCULAR | Status: AC
  Filled 2013-08-23: qty 5

## 2013-08-23 MED ORDER — LACTATED RINGERS IV SOLN: 500.0000 mL | Freq: Once | INTRAVENOUS | Status: AC | PRN

## 2013-08-23 MED ORDER — SODIUM CHLORIDE 0.45 % IV SOLN
INTRAVENOUS | Status: DC
Start: 2013-08-23 — End: 2013-08-25
  Administered 2013-08-23: 20 mL/h via INTRAVENOUS

## 2013-08-23 MED ORDER — PROPOFOL 10 MG/ML IV BOLUS
INTRAVENOUS | Status: DC | PRN
  Administered 2013-08-23: 60 mg via INTRAVENOUS

## 2013-08-23 MED ORDER — ROCURONIUM BROMIDE 50 MG/5ML IV SOLN
INTRAVENOUS | Status: AC
  Filled 2013-08-23: qty 1

## 2013-08-23 MED ORDER — ROCURONIUM BROMIDE 100 MG/10ML IV SOLN
INTRAVENOUS | Status: DC | PRN
  Administered 2013-08-23: 50 mg via INTRAVENOUS

## 2013-08-23 MED ORDER — 0.9 % SODIUM CHLORIDE (POUR BTL) OPTIME
TOPICAL | Status: DC | PRN
  Administered 2013-08-23: 5000 mL

## 2013-08-23 MED ORDER — NIACIN-SIMVASTATIN ER 500-20 MG PO TB24: 1.0000 | ORAL_TABLET | Freq: Every day | ORAL | Status: DC

## 2013-08-23 MED ORDER — GLYCOPYRROLATE 0.2 MG/ML IJ SOLN
INTRAMUSCULAR | Status: AC
  Filled 2013-08-23: qty 1

## 2013-08-23 MED ORDER — PANTOPRAZOLE SODIUM 40 MG PO TBEC
40.0000 mg | DELAYED_RELEASE_TABLET | Freq: Every day | ORAL | Status: DC
  Administered 2013-08-25 – 2013-08-29 (×5): 40 mg via ORAL
  Filled 2013-08-23 (×4): qty 1

## 2013-08-23 MED ORDER — ACETAMINOPHEN 500 MG PO TABS
1000.0000 mg | ORAL_TABLET | Freq: Four times a day (QID) | ORAL | Status: AC
  Administered 2013-08-24 – 2013-08-28 (×19): 1000 mg via ORAL
  Filled 2013-08-23 (×23): qty 2

## 2013-08-23 MED ORDER — FENTANYL CITRATE 0.05 MG/ML IJ SOLN
INTRAMUSCULAR | Status: DC | PRN
  Administered 2013-08-23: 50 ug via INTRAVENOUS
  Administered 2013-08-23: 500 ug via INTRAVENOUS
  Administered 2013-08-23: 250 ug via INTRAVENOUS
  Administered 2013-08-23: 100 ug via INTRAVENOUS
  Administered 2013-08-23: 250 ug via INTRAVENOUS
  Administered 2013-08-23 (×2): 50 ug via INTRAVENOUS

## 2013-08-23 MED ORDER — NIACIN ER 500 MG PO CPCR
500.0000 mg | ORAL_CAPSULE | Freq: Every day | ORAL | Status: DC
  Administered 2013-08-23 – 2013-08-28 (×6): 500 mg via ORAL
  Filled 2013-08-23 (×8): qty 1

## 2013-08-23 MED ORDER — POTASSIUM CHLORIDE 10 MEQ/50ML IV SOLN
10.0000 meq | INTRAVENOUS | Status: AC
  Administered 2013-08-23 (×3): 10 meq via INTRAVENOUS

## 2013-08-23 MED ORDER — DEXMEDETOMIDINE HCL IN NACL 200 MCG/50ML IV SOLN: 0.1000 ug/kg/h | INTRAVENOUS | Status: DC

## 2013-08-23 MED ORDER — VECURONIUM BROMIDE 10 MG IV SOLR
INTRAVENOUS | Status: DC | PRN
  Administered 2013-08-23: 4 mg via INTRAVENOUS
  Administered 2013-08-23: 6 mg via INTRAVENOUS
  Administered 2013-08-23: 10 mg via INTRAVENOUS

## 2013-08-23 MED ORDER — ACETAMINOPHEN 160 MG/5ML PO SOLN: 650.0000 mg | Freq: Once | ORAL | Status: AC

## 2013-08-23 MED ORDER — POTASSIUM CHLORIDE 10 MEQ/50ML IV SOLN
10.0000 meq | Freq: Once | INTRAVENOUS | Status: AC
  Administered 2013-08-23: 10 meq via INTRAVENOUS

## 2013-08-23 MED ORDER — SODIUM CHLORIDE 0.9 % IV SOLN
10.0000 mg | INTRAVENOUS | Status: DC | PRN
  Administered 2013-08-23: 50 ug/min via INTRAVENOUS

## 2013-08-23 MED ORDER — DEXTROSE 5 % IV SOLN
0.0000 ug/min | INTRAVENOUS | Status: DC
  Administered 2013-08-24: 25 ug/min via INTRAVENOUS
  Filled 2013-08-23: qty 2

## 2013-08-23 MED ORDER — METOPROLOL TARTRATE 1 MG/ML IV SOLN: 2.5000 mg | INTRAVENOUS | Status: DC | PRN

## 2013-08-23 MED ORDER — DEXTROSE 5 % IV SOLN
1.5000 g | Freq: Two times a day (BID) | INTRAVENOUS | Status: DC
  Filled 2013-08-23: qty 1.5

## 2013-08-23 MED ORDER — ASPIRIN EC 325 MG PO TBEC
325.0000 mg | DELAYED_RELEASE_TABLET | Freq: Every day | ORAL | Status: DC
  Administered 2013-08-24 – 2013-08-29 (×4): 325 mg via ORAL
  Filled 2013-08-23 (×6): qty 1

## 2013-08-23 MED ORDER — MORPHINE SULFATE 2 MG/ML IJ SOLN: 1.0000 mg | INTRAMUSCULAR | Status: AC | PRN

## 2013-08-23 MED ORDER — FAMOTIDINE IN NACL 20-0.9 MG/50ML-% IV SOLN
20.0000 mg | Freq: Two times a day (BID) | INTRAVENOUS | Status: AC
  Administered 2013-08-23: 20 mg via INTRAVENOUS

## 2013-08-23 MED ORDER — METOPROLOL TARTRATE 12.5 MG HALF TABLET
12.5000 mg | ORAL_TABLET | Freq: Two times a day (BID) | ORAL | Status: DC
  Filled 2013-08-23 (×3): qty 1

## 2013-08-23 MED ORDER — SODIUM CHLORIDE 0.9 % IJ SOLN
3.0000 mL | Freq: Two times a day (BID) | INTRAMUSCULAR | Status: DC
  Administered 2013-08-24 – 2013-08-25 (×3): 3 mL via INTRAVENOUS

## 2013-08-23 MED ORDER — SODIUM CHLORIDE 0.9 % IV SOLN
INTRAVENOUS | Status: DC | PRN
  Administered 2013-08-23: 12:00:00 via INTRAVENOUS

## 2013-08-23 MED ORDER — BISACODYL 10 MG RE SUPP: 10.0000 mg | Freq: Every day | RECTAL | Status: DC

## 2013-08-23 MED ORDER — LACTATED RINGERS IV SOLN
INTRAVENOUS | Status: DC | PRN
  Administered 2013-08-23 (×2): via INTRAVENOUS

## 2013-08-23 MED ORDER — MORPHINE SULFATE 2 MG/ML IJ SOLN
2.0000 mg | INTRAMUSCULAR | Status: DC | PRN
  Administered 2013-08-23 (×2): 2 mg via INTRAVENOUS
  Administered 2013-08-23: 4 mg via INTRAVENOUS
  Administered 2013-08-23: 2 mg via INTRAVENOUS
  Administered 2013-08-23 – 2013-08-24 (×3): 4 mg via INTRAVENOUS
  Administered 2013-08-24: 2 mg via INTRAVENOUS
  Administered 2013-08-24 (×4): 4 mg via INTRAVENOUS
  Filled 2013-08-23: qty 1
  Filled 2013-08-23: qty 2
  Filled 2013-08-23: qty 1
  Filled 2013-08-23: qty 2
  Filled 2013-08-23: qty 1
  Filled 2013-08-23 (×5): qty 2
  Filled 2013-08-23: qty 1
  Filled 2013-08-23: qty 2

## 2013-08-23 MED ORDER — LACTATED RINGERS IV SOLN
INTRAVENOUS | Status: DC
  Administered 2013-08-23: 20:00:00 via INTRAVENOUS

## 2013-08-23 MED ORDER — EPHEDRINE SULFATE 50 MG/ML IJ SOLN
INTRAMUSCULAR | Status: AC
  Filled 2013-08-23: qty 1

## 2013-08-23 MED ORDER — ACETAMINOPHEN 160 MG/5ML PO SOLN
1000.0000 mg | Freq: Four times a day (QID) | ORAL | Status: AC
  Filled 2013-08-23: qty 40

## 2013-08-23 MED ORDER — MAGNESIUM SULFATE 40 MG/ML IJ SOLN
INTRAMUSCULAR | Status: AC
  Filled 2013-08-23: qty 100

## 2013-08-23 MED ORDER — PHENYLEPHRINE 40 MCG/ML (10ML) SYRINGE FOR IV PUSH (FOR BLOOD PRESSURE SUPPORT)
PREFILLED_SYRINGE | INTRAVENOUS | Status: AC
  Filled 2013-08-23: qty 10

## 2013-08-23 MED ORDER — HEPARIN SODIUM (PORCINE) 1000 UNIT/ML IJ SOLN
INTRAMUSCULAR | Status: AC
  Filled 2013-08-23: qty 1

## 2013-08-23 MED ORDER — MIDAZOLAM HCL 2 MG/2ML IJ SOLN: 2.0000 mg | INTRAMUSCULAR | Status: DC | PRN

## 2013-08-23 MED ORDER — LIDOCAINE HCL (CARDIAC) 20 MG/ML IV SOLN
INTRAVENOUS | Status: DC | PRN
  Administered 2013-08-23: 100 mg via INTRAVENOUS

## 2013-08-23 MED ORDER — METOPROLOL TARTRATE 25 MG/10 ML ORAL SUSPENSION
12.5000 mg | Freq: Two times a day (BID) | ORAL | Status: DC
  Filled 2013-08-23 (×3): qty 5

## 2013-08-23 MED ORDER — SODIUM CHLORIDE 0.9 % IV SOLN: 250.0000 mL | INTRAVENOUS | Status: DC

## 2013-08-23 MED ORDER — GLYCOPYRROLATE 0.2 MG/ML IJ SOLN
INTRAMUSCULAR | Status: DC | PRN
  Administered 2013-08-23: 0.2 mg via INTRAVENOUS

## 2013-08-23 MED ORDER — INSULIN REGULAR BOLUS VIA INFUSION
0.0000 [IU] | Freq: Three times a day (TID) | INTRAVENOUS | Status: DC
  Administered 2013-08-24: 2 [IU] via INTRAVENOUS
  Filled 2013-08-23: qty 10

## 2013-08-23 MED ORDER — MIDAZOLAM HCL 5 MG/5ML IJ SOLN
INTRAMUSCULAR | Status: DC | PRN
  Administered 2013-08-23: 5 mg via INTRAVENOUS
  Administered 2013-08-23: 4 mg via INTRAVENOUS
  Administered 2013-08-23: 1 mg via INTRAVENOUS

## 2013-08-23 MED ORDER — HEPARIN SODIUM (PORCINE) 1000 UNIT/ML IJ SOLN
INTRAMUSCULAR | Status: DC | PRN
  Administered 2013-08-23: 24000 [IU] via INTRAVENOUS

## 2013-08-23 MED ORDER — NITROGLYCERIN IN D5W 200-5 MCG/ML-% IV SOLN
0.0000 ug/min | INTRAVENOUS | Status: DC
Start: 2013-08-23 — End: 2013-08-25

## 2013-08-23 MED ORDER — ALBUMIN HUMAN 5 % IV SOLN
250.0000 mL | INTRAVENOUS | Status: AC | PRN
  Administered 2013-08-23 (×3): 250 mL via INTRAVENOUS
  Filled 2013-08-23: qty 250

## 2013-08-23 MED ORDER — SODIUM CHLORIDE 0.9 % IJ SOLN
OROMUCOSAL | Status: DC | PRN
  Administered 2013-08-23 (×3): via TOPICAL

## 2013-08-23 MED ORDER — LIDOCAINE HCL (CARDIAC) 20 MG/ML IV SOLN
INTRAVENOUS | Status: AC
  Filled 2013-08-23: qty 5

## 2013-08-23 MED ORDER — VANCOMYCIN HCL IN DEXTROSE 1-5 GM/200ML-% IV SOLN
1000.0000 mg | Freq: Once | INTRAVENOUS | Status: AC
  Administered 2013-08-23: 1000 mg via INTRAVENOUS
  Filled 2013-08-23: qty 200

## 2013-08-23 MED ORDER — HEMOSTATIC AGENTS (NO CHARGE) OPTIME
TOPICAL | Status: DC | PRN
  Administered 2013-08-23: 1 via TOPICAL

## 2013-08-23 MED ORDER — ASPIRIN 81 MG PO CHEW
324.0000 mg | CHEWABLE_TABLET | Freq: Every day | ORAL | Status: DC
  Administered 2013-08-26 – 2013-08-28 (×2): 324 mg
  Filled 2013-08-23 (×2): qty 4

## 2013-08-23 MED ORDER — MAGNESIUM SULFATE 4000MG/100ML IJ SOLN
4.0000 g | Freq: Once | INTRAMUSCULAR | Status: AC
  Administered 2013-08-23: 4 g via INTRAVENOUS

## 2013-08-23 MED ORDER — SODIUM CHLORIDE 0.9 % IV SOLN
0.5000 g/h | Freq: Once | INTRAVENOUS | Status: DC
  Filled 2013-08-23: qty 20

## 2013-08-23 MED ORDER — SODIUM BICARBONATE 8.4 % IV SOLN
50.0000 meq | Freq: Once | INTRAVENOUS | Status: AC
  Administered 2013-08-23: 50 meq via INTRAVENOUS
  Filled 2013-08-23: qty 50

## 2013-08-23 MED ORDER — SODIUM CHLORIDE 0.9 % IJ SOLN
INTRAMUSCULAR | Status: AC
  Filled 2013-08-23: qty 30

## 2013-08-23 MED ORDER — ONDANSETRON HCL 4 MG/2ML IJ SOLN
4.0000 mg | Freq: Four times a day (QID) | INTRAMUSCULAR | Status: DC | PRN
  Administered 2013-08-23 – 2013-08-24 (×2): 4 mg via INTRAVENOUS
  Filled 2013-08-23 (×2): qty 2

## 2013-08-23 MED ORDER — SIMVASTATIN 20 MG PO TABS
20.0000 mg | ORAL_TABLET | Freq: Every day | ORAL | Status: DC
  Administered 2013-08-24 – 2013-08-28 (×5): 20 mg via ORAL
  Filled 2013-08-23 (×8): qty 1

## 2013-08-23 MED ORDER — DOCUSATE SODIUM 100 MG PO CAPS
200.0000 mg | ORAL_CAPSULE | Freq: Every day | ORAL | Status: DC
Start: 2013-08-24 — End: 2013-08-29
  Administered 2013-08-24 – 2013-08-29 (×6): 200 mg via ORAL
  Filled 2013-08-23 (×7): qty 2

## 2013-08-23 MED FILL — Heparin Sodium (Porcine) Inj 1000 Unit/ML: INTRAMUSCULAR | Qty: 10 | Status: AC

## 2013-08-23 MED FILL — Sodium Bicarbonate IV Soln 8.4%: INTRAVENOUS | Qty: 50 | Status: AC

## 2013-08-23 MED FILL — Electrolyte-R (PH 7.4) Solution: INTRAVENOUS | Qty: 4000 | Status: AC

## 2013-08-23 MED FILL — Heparin Sodium (Porcine) Inj 1000 Unit/ML: INTRAMUSCULAR | Qty: 30 | Status: AC

## 2013-08-23 MED FILL — Sodium Chloride IV Soln 0.9%: INTRAVENOUS | Qty: 1000 | Status: AC

## 2013-08-23 MED FILL — Lidocaine HCl IV Inj 20 MG/ML: INTRAVENOUS | Qty: 5 | Status: AC

## 2013-08-23 MED FILL — Mannitol IV Soln 20%: INTRAVENOUS | Qty: 500 | Status: AC

## 2013-08-23 SURGICAL SUPPLY — 88 items
ATTRACTOMAT 16X20 MAGNETIC DRP (DRAPES) ×3 IMPLANT
BAG DECANTER FOR FLEXI CONT (MISCELLANEOUS) ×3 IMPLANT
BANDAGE ELASTIC 4 VELCRO ST LF (GAUZE/BANDAGES/DRESSINGS) ×3 IMPLANT
BANDAGE ELASTIC 6 VELCRO ST LF (GAUZE/BANDAGES/DRESSINGS) ×3 IMPLANT
BANDAGE GAUZE ELAST BULKY 4 IN (GAUZE/BANDAGES/DRESSINGS) ×3 IMPLANT
BASKET HEART (ORDER IN 25'S) (MISCELLANEOUS) ×1
BASKET HEART (ORDER IN 25S) (MISCELLANEOUS) ×2 IMPLANT
BLADE 10 SAFETY STRL DISP (BLADE) ×2 IMPLANT
BLADE STERNUM SYSTEM 6 (BLADE) ×3 IMPLANT
CANISTER SUCTION 2500CC (MISCELLANEOUS) ×3 IMPLANT
CANNULA EZ GLIDE AORTIC 21FR (CANNULA) ×3 IMPLANT
CARDIAC SUCTION (MISCELLANEOUS) ×3 IMPLANT
CATH CPB KIT HENDRICKSON (MISCELLANEOUS) ×3 IMPLANT
CATH ROBINSON RED A/P 18FR (CATHETERS) ×3 IMPLANT
CATH THORACIC 36FR (CATHETERS) ×3 IMPLANT
CATH THORACIC 36FR RT ANG (CATHETERS) ×3 IMPLANT
CLIP TI MEDIUM 24 (CLIP) IMPLANT
CLIP TI WIDE RED SMALL 24 (CLIP) ×2 IMPLANT
COVER SURGICAL LIGHT HANDLE (MISCELLANEOUS) ×3 IMPLANT
CRADLE DONUT ADULT HEAD (MISCELLANEOUS) ×3 IMPLANT
DRAPE CARDIOVASCULAR INCISE (DRAPES) ×3
DRAPE SLUSH/WARMER DISC (DRAPES) ×3 IMPLANT
DRAPE SRG 135X102X78XABS (DRAPES) ×2 IMPLANT
DRSG COVADERM 4X14 (GAUZE/BANDAGES/DRESSINGS) ×3 IMPLANT
ELECT REM PT RETURN 9FT ADLT (ELECTROSURGICAL) ×6
ELECTRODE REM PT RTRN 9FT ADLT (ELECTROSURGICAL) ×4 IMPLANT
GLOVE BIO SURGEON STRL SZ7.5 (GLOVE) ×2 IMPLANT
GLOVE BIO SURGEON STRL SZ8 (GLOVE) ×3 IMPLANT
GLOVE BIOGEL PI IND STRL 6 (GLOVE) IMPLANT
GLOVE BIOGEL PI IND STRL 6.5 (GLOVE) IMPLANT
GLOVE BIOGEL PI IND STRL 7.0 (GLOVE) IMPLANT
GLOVE BIOGEL PI INDICATOR 6 (GLOVE) ×3
GLOVE BIOGEL PI INDICATOR 6.5 (GLOVE) ×2
GLOVE BIOGEL PI INDICATOR 7.0 (GLOVE) ×2
GLOVE EUDERMIC 7 POWDERFREE (GLOVE) ×9 IMPLANT
GOWN STRL REUS W/ TWL LRG LVL3 (GOWN DISPOSABLE) ×8 IMPLANT
GOWN STRL REUS W/ TWL XL LVL3 (GOWN DISPOSABLE) ×6 IMPLANT
GOWN STRL REUS W/TWL LRG LVL3 (GOWN DISPOSABLE) ×12
GOWN STRL REUS W/TWL XL LVL3 (GOWN DISPOSABLE) ×9
HEMOSTAT POWDER SURGIFOAM 1G (HEMOSTASIS) ×9 IMPLANT
HEMOSTAT SURGICEL 2X14 (HEMOSTASIS) ×3 IMPLANT
INSERT FOGARTY XLG (MISCELLANEOUS) IMPLANT
KIT BASIN OR (CUSTOM PROCEDURE TRAY) ×3 IMPLANT
KIT ROOM TURNOVER OR (KITS) ×3 IMPLANT
KIT SUCTION CATH 14FR (SUCTIONS) ×6 IMPLANT
KIT VASOVIEW W/TROCAR VH 2000 (KITS) ×3 IMPLANT
MARKER GRAFT CORONARY BYPASS (MISCELLANEOUS) ×9 IMPLANT
NS IRRIG 1000ML POUR BTL (IV SOLUTION) ×15 IMPLANT
PACK OPEN HEART (CUSTOM PROCEDURE TRAY) ×3 IMPLANT
PAD ARMBOARD 7.5X6 YLW CONV (MISCELLANEOUS) ×6 IMPLANT
PAD ELECT DEFIB RADIOL ZOLL (MISCELLANEOUS) ×3 IMPLANT
PENCIL BUTTON HOLSTER BLD 10FT (ELECTRODE) ×3 IMPLANT
PUNCH AORTIC ROTATE 4.0MM (MISCELLANEOUS) IMPLANT
PUNCH AORTIC ROTATE 4.5MM 8IN (MISCELLANEOUS) ×1 IMPLANT
PUNCH AORTIC ROTATE 5MM 8IN (MISCELLANEOUS) IMPLANT
SET CARDIOPLEGIA MPS 5001102 (MISCELLANEOUS) ×1 IMPLANT
SPONGE GAUZE 4X4 12PLY (GAUZE/BANDAGES/DRESSINGS) ×6 IMPLANT
SUT BONE WAX W31G (SUTURE) ×3 IMPLANT
SUT MNCRL AB 4-0 PS2 18 (SUTURE) IMPLANT
SUT PROLENE 3 0 SH DA (SUTURE) ×3 IMPLANT
SUT PROLENE 4 0 RB 1 (SUTURE)
SUT PROLENE 4 0 SH DA (SUTURE) IMPLANT
SUT PROLENE 4-0 RB1 .5 CRCL 36 (SUTURE) IMPLANT
SUT PROLENE 6 0 C 1 30 (SUTURE) ×9 IMPLANT
SUT PROLENE 7 0 BV 1 (SUTURE) ×2 IMPLANT
SUT PROLENE 7 0 BV1 MDA (SUTURE) ×7 IMPLANT
SUT PROLENE 8 0 BV175 6 (SUTURE) IMPLANT
SUT STEEL 6MS V (SUTURE) ×3 IMPLANT
SUT STEEL STERNAL CCS#1 18IN (SUTURE) IMPLANT
SUT STEEL SZ 6 DBL 3X14 BALL (SUTURE) ×3 IMPLANT
SUT VIC AB 1 CTX 36 (SUTURE) ×6
SUT VIC AB 1 CTX36XBRD ANBCTR (SUTURE) ×4 IMPLANT
SUT VIC AB 2-0 CT1 27 (SUTURE) ×3
SUT VIC AB 2-0 CT1 TAPERPNT 27 (SUTURE) IMPLANT
SUT VIC AB 2-0 CTX 27 (SUTURE) IMPLANT
SUT VIC AB 3-0 SH 27 (SUTURE)
SUT VIC AB 3-0 SH 27X BRD (SUTURE) IMPLANT
SUT VIC AB 3-0 X1 27 (SUTURE) ×1 IMPLANT
SUT VICRYL 4-0 PS2 18IN ABS (SUTURE) IMPLANT
SUTURE E-PAK OPEN HEART (SUTURE) ×3 IMPLANT
SYSTEM SAHARA CHEST DRAIN ATS (WOUND CARE) ×3 IMPLANT
TOWEL OR 17X24 6PK STRL BLUE (TOWEL DISPOSABLE) ×6 IMPLANT
TOWEL OR 17X26 10 PK STRL BLUE (TOWEL DISPOSABLE) ×6 IMPLANT
TRAY FOLEY IC TEMP SENS 16FR (CATHETERS) ×3 IMPLANT
TUBE FEEDING 8FR 16IN STR KANG (MISCELLANEOUS) ×3 IMPLANT
TUBING INSUFFLATION 10FT LAP (TUBING) ×3 IMPLANT
UNDERPAD 30X30 INCONTINENT (UNDERPADS AND DIAPERS) ×3 IMPLANT
WATER STERILE IRR 1000ML POUR (IV SOLUTION) ×6 IMPLANT

## 2013-08-23 NOTE — Procedures (Signed)
Extubation Procedure Note  Patient Details:   Name: DEJAY KRONK DOB: 1950-07-21 MRN: [id number]   Airway Documentation:     Evaluation  O2 sats: stable throughout Complications: No apparent complications Patient did tolerate procedure well. Bilateral Breath Sounds: Clear;Diminished  NIF -20 VC 800 mL Pt placed on 3L Nasal cannula Yes  Jones Skene Reef Achterberg 08/23/2013, 6:02 PM

## 2013-08-23 NOTE — Transfer of Care (Signed)
Immediate Anesthesia Transfer of Care Note  Patient: Michael Maddox  Procedure(s) Performed: Procedure(s) with comments: CORONARY ARTERY BYPASS GRAFTING (CABG) (N/A) - Coronary Artery Bypass graft times three using left internal mammary artery and left leg saphenous vein  INTRAOPERATIVE TRANSESOPHAGEAL ECHOCARDIOGRAM (N/A)  Patient Location: SICU  Anesthesia Type:General  Level of Consciousness: unresponsive  Airway & Oxygen Therapy: Patient remains intubated per anesthesia plan and Patient placed on Ventilator (see vital sign flow sheet for setting)  Post-op Assessment: Report given to PACU RN and Post -op Vital signs reviewed and stable  Post vital signs: Reviewed and stable  Complications: No apparent anesthesia complications

## 2013-08-23 NOTE — Anesthesia Postprocedure Evaluation (Signed)
Anesthesia Post Note  Patient: Michael Maddox  Procedure(s) Performed: Procedure(s) (LRB): CORONARY ARTERY BYPASS GRAFTING (CABG) (N/A) INTRAOPERATIVE TRANSESOPHAGEAL ECHOCARDIOGRAM (N/A)  Anesthesia type: General  Patient location: ICU  Post pain: Pain level controlled  Post assessment: Post-op Vital signs reviewed  Last Vitals:  Filed Vitals:   08/23/13 1615  BP:   Pulse: 79  Temp: 36.8 C  Resp: 14    Post vital signs: stable  Level of consciousness: Patient remains intubated per anesthesia plan  Complications: No apparent anesthesia complications

## 2013-08-23 NOTE — Progress Notes (Signed)
Patient ID: Michael Maddox, male   DOB: [birth year], 63 y.o.   MRN: [id number]   SICU Evening Rounds:   Hemodynamically stable  CI = 2.5  Extubated and alert  Urine output good   CT output low  CBC    Component Value Date/Time   WBC 6.7 08/23/2013 1345   RBC 3.21* 08/23/2013 1345   HGB 9.7* 08/23/2013 1345   HCT 28.7* 08/23/2013 1345   PLT 123* 08/23/2013 1345   MCV 89.4 08/23/2013 1345   MCH 30.2 08/23/2013 1345   MCHC 33.8 08/23/2013 1345   RDW 12.7 08/23/2013 1345   LYMPHSABS 2.1 02/22/2009 1821   MONOABS 0.6 02/22/2009 1821   EOSABS 0.3 02/22/2009 1821   BASOSABS 0.0 02/22/2009 1821     BMET    Component Value Date/Time   NA 140 08/23/2013 1304   K 3.4* 08/23/2013 1304   CL 101 27-Sep-202015 1309   CO2 18* 27-Sep-202015 1309   GLUCOSE 109* 08/23/2013 1304   BUN 17 27-Sep-202015 1309   CREATININE 0.85 27-Sep-202015 1309   CREATININE 1.03 08/10/2013 0701   CALCIUM 9.5 27-Sep-202015 1309   GFRNONAA >90 27-Sep-202015 1309   GFRAA >90 27-Sep-202015 1309     A/P:  Stable postop course. Continue current plans

## 2013-08-23 NOTE — Anesthesia Preprocedure Evaluation (Addendum)
Anesthesia Evaluation  Patient identified by MRN, date of birth, ID band Patient awake    Reviewed: Allergy & Precautions, H&P , NPO status , Patient's Chart, lab work & pertinent test results  History of Anesthesia Complications Negative for: history of anesthetic complications  Airway Mallampati: II TM Distance: >3 FB Neck ROM: Full    Dental  (+) Dental Advisory Given, Chipped   Pulmonary former smoker,          Cardiovascular hypertension, Pt. on home beta blockers + CAD and + Peripheral Vascular Disease     Neuro/Psych CVA negative psych ROS   GI/Hepatic Neg liver ROS, GERD-  Medicated,  Endo/Other    Renal/GU negative Renal ROS     Musculoskeletal   Abdominal   Peds  Hematology   Anesthesia Other Findings   Reproductive/Obstetrics                         Anesthesia Physical Anesthesia Plan  ASA: IV  Anesthesia Plan: General   Post-op Pain Management:    Induction: Intravenous  Airway Management Planned: Oral ETT  Additional Equipment:   Intra-op Plan:   Post-operative Plan: Post-operative intubation/ventilation  Informed Consent:   Plan Discussed with: CRNA, Anesthesiologist and Surgeon  Anesthesia Plan Comments:         Anesthesia Quick Evaluation

## 2013-08-23 NOTE — Plan of Care (Signed)
Problem: Phase II - Intermediate Post-Op Goal: Wean to Extubate Outcome: Completed/Met Date Met:  08/23/13 Within 6 hours

## 2013-08-23 NOTE — H&P (View-Only) (Signed)
Reason for Consult:3 vessel CAD  Referring Physician: Dr. Berry/ Dr. Avebuere  Muneeb B Mealing is an 63 y.o. male.  HPI: 63 yo male with known ASCVD and CAD and multiple cardiac risk factors presents with cc/o palpitaions, dizziness and heaviness in his chest.   Mr. Shuffler is a 63 yo man with multiple CRF including hypertension, hyperlipidemia, tobacco abuse and a positive family history of "heart trouble." He has an extensive history of ASCVD. He has had left common iliac and bilateral SFA stents most recently in 2005. He had a stroke in 2010 and had a right carotid endarterectomy by Dr. Dickson. He had smoked regularly until that time, but "quit" and now only smokes cigarettes occasionally.   He has been having claudication in his legs bilaterally right > left and his ABIs were 0.25 on the left and 0.39 on the right. He was being evaluated for a peripheral arteriogram prior to a vascular intervention. He noted that he had been having chest heaviness with exertion and palpitations at times as well. He also c/o some dizziness. A myoview stress test showed inferoseptal ischemia.   Today he had cardiac catheterization which revealed severe 3 vessel CAD and a 60% left subclavian stenosis. He is currently pain free. He also had an abdominal aortagram with peripheral runoff. It showed severe iliac disease and SFA occlusion bilaterally. He is taking plavix.  He currently works full time as an architectural engineer.  Past Medical History  Diagnosis Date  . Stroke 2010  . HTN (hypertension)   . Hyperlipemia   . History of stress test 02/23/2003    A nagative cardiolite stress test  . Palpitations   . Peripheral arterial disease   . Carotid artery disease   . Coronary artery disease     history of 3 vessel CAD at cardiac catheterization 12/01/00  . Shortness of breath   . GERD (gastroesophageal reflux disease)   . 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 Arterial insufficiency     SEVERE   ILLIAC & SFA     Past Surgical History  Procedure Laterality Date  . Stents  2004    He had left common ilac artery percutaneous transluminal angioplasty and stenting and bilateral superfacial femoral artery percutaneous transluminal angioplasty and stenting.  . Angiograpy  2005    Revealing a moderate long segment occlusion within his right superficial femoral artery stent which i was able to re-canalize and re-stent for symptomatic claudication.  . Carotid endarterectomy      Family History  Problem Relation Age of Onset  . Diabetes Mother   . Heart disease Father     Social History:  reports that he has been smoking Cigarettes.  He has been smoking about 0.00 packs per day. He has never used smokeless tobacco. He reports that he drinks about 2.5 ounces of alcohol per week. He reports that he does not use illicit drugs.  Allergies: No Known Allergies  Medications:  Prior to Admission:  Prescriptions prior to admission  Medication Sig Dispense Refill  . clopidogrel (PLAVIX) 75 MG tablet Take 75 mg by mouth daily with breakfast.      . Flaxseed, Linseed, (FLAX SEED OIL) 1000 MG CAPS Take 2 capsules by mouth daily.      . ibuprofen (ADVIL,MOTRIN) 200 MG tablet Take 400 mg by mouth every 8 (eight) hours as needed for moderate pain.      . Multiple Vitamin (MULTIVITAMIN WITH MINERALS) TABS tablet Take 1 tablet by   mouth daily.      . nebivolol (BYSTOLIC) 5 MG tablet Take 5 mg by mouth daily.      . niacin-simvastatin (SIMCOR) 500-20 MG 24 hr tablet Take 1 tablet by mouth at bedtime.      . vitamin C (ASCORBIC ACID) 500 MG tablet Take 500 mg by mouth daily.        No results found for this or any previous visit (from the past 48 hour(s)).  No results found.  Review of Systems  Constitutional: Positive for malaise/fatigue. Negative for fever and chills.  Respiratory: Positive for shortness of breath (with exertion).   Cardiovascular: Positive for chest pain ("heaviness"), palpitations and  claudication.       +claudication R>L  Gastrointestinal: Negative for heartburn.  Genitourinary: Negative.   Musculoskeletal: Positive for joint pain.  Neurological: Negative.        History of stroke in 2010, no residual deficit  All other systems reviewed and are negative.  Blood pressure 147/49, pulse 48, temperature 97.9 F (36.6 C), temperature source Oral, resp. rate 20, height 5\' 11"  (1.803 m), weight 155 lb (70.308 kg), SpO2 100.00%. Physical Exam  Vitals reviewed. Constitutional: He is oriented to person, place, and time. He appears well-developed and well-nourished. No distress.  HENT:  Head: Normocephalic and atraumatic.  Eyes: EOM are normal. Pupils are equal, round, and reactive to light.  Neck: No tracheal deviation present. No thyromegaly present.  Well healed incision R neck, + bruit on L  Cardiovascular: Normal rate, regular rhythm and normal heart sounds.  Exam reveals no gallop and no friction rub.   No murmur heard. Respiratory: Effort normal and breath sounds normal. He has no wheezes. He has no rales.  GI: Soft. There is no tenderness.  Musculoskeletal: He exhibits no edema.  Neurological: He is alert and oriented to person, place, and time. No cranial nerve deficit.  Skin: Skin is warm and dry.   PERIPHERAL ARTERIOGRAM Angiographic Data:  1: Left subclavian artery-60% proximal left subclavian artery stenosis with a 20 mm pullback gradient  2: Left lower extremity-60% proximal segmental left common iliac artery stenosis. The previously placed left external iliac artery stent was patent. The left external iliac artery and common femoral artery were diffusely diseased with a focal 80-90% stenosis. The SFA was occluded at its origin with reconstitution in the adductor canal by profunda femoris collaterals. There was three-vessel runoff.  3: Right lower extremity-the right common and external iliac artery were occluded. The right common femoral artery filled by  collaterals as did the profunda femoris. The right SFA was occluded at its origin with reconstitution in the adductor canal. There was three-vessel runoff.  CARDIAC CATHETERIZATION ANGIOGRAPHIC RESULTS:  1. Left main; normal  2. LAD; 95% proximal to mid at a diagonal branch takeoff  3. Left circumflex; 75% segmental proximal OM1.  4. 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 Left ventriculography;not performed today  Assessment/Plan: 63 yo man with multiple cardiac risk factors and severe premature ASCVD presents with claudication and chest heaviness and palpitations. He has severe peripheral arterial disease as well. Catheterization today revealed severe 3 vessel disease with an EF of 50% and a 60% left subclavian stenosis.   CABG is indicated for survival benefit and relief of symptoms. It is also necessary to revascularize his heart prior to  his upcoming peripheral arterial surgery.  I discussed the general nature of the procedure, the need for general anesthesia, and the incisions to be used with Mr. Pfost and his family. I informed them of the indications, risks, benefits and alternatives. We discussed the expected hospital stay, the overall recovery and the short and long term outcomes. They understand the risks include, but are not limited to death, stroke, MI, DVT/PE, bleeding, possible need for transfusion, infections, cardiac arrhythmias, and other organ system dysfunction including respiratory, renal, or GI complications. They understand he does have an elevated risk for stroke due to his history.  He accepts the risks and agrees to proceed.  He needs to be off plavix for 5-7 days prior to CABG. We are planning CABG on Monday 08/23/2013. He will be discharged this afternoon  Elridge Stemm C  Jalene Lacko 08/16/2013, 1:35 PM      

## 2013-08-23 NOTE — Brief Op Note (Addendum)
      [address]       [place] [address]             [phone]     08/23/2013  11:04 AM  PATIENT:  Michael Maddox  63 y.o. male  PRE-OPERATIVE DIAGNOSIS:  CAD  POST-OPERATIVE DIAGNOSIS:  Coronary artery disease  PROCEDURE:  Procedure(s): CORONARY ARTERY BYPASS GRAFTING (CABG)X 3 LIMA(FREE)-LAD; SVG-PD; SVG-OM INTRAOPERATIVE TRANSESOPHAGEAL ECHOCARDIOGRAM [facility] LEFT THIGH  SURGEON:  Surgeon(s): Melrose Nakayama, MD  PHYSICIAN ASSISTANT: WAYNE GOLD PA-C  ANESTHESIA:   general  PATIENT CONDITION:  ICU - intubated and hemodynamically stable.  PRE-OPERATIVE WEIGHT: 63FH  COMPLICATIONS: NO KNOWN  XC= 70 min CPB= 102 min  Fair targets, good conduits, TEE - good LV function

## 2013-08-23 NOTE — Anesthesia Procedure Notes (Addendum)
Procedure Name: Intubation Date/Time: 08/23/2013 7:48 AM Performed by: Octavio Graves Pre-anesthesia Checklist: Patient identified, Timeout performed, Emergency Drugs available, Suction available and Patient being monitored Patient Re-evaluated:Patient Re-evaluated prior to inductionOxygen Delivery Method: Circle system utilized Preoxygenation: Pre-oxygenation with 100% oxygen Intubation Type: IV induction Ventilation: Mask ventilation without difficulty Laryngoscope Size: Miller and 2 Tube type: Oral Number of attempts: 1 Airway Equipment and Method: Stylet Placement Confirmation: ETT inserted through vocal cords under direct vision,  positive ETCO2 and breath sounds checked- equal and bilateral Secured at: 23 cm Tube secured with: Tape Dental Injury: Teeth and Oropharynx as per pre-operative assessment  Comments: IV induction Singer- intubation AM CRNA atraumatic - chipped teeth prior to laryngoscopy- teeth and mouth as preop- atraumatic- bilat BS Tobias Alexander      The patient was identified and consent obtained.  TO was performed, and full barrier precautions were used.  The skin was anesthetized with lidocaine.  Once the vein was located with the 22 ga. needle using ultrasound guidance , the wire was inserted into the vein.  The wire location was confirmed with ultrasound.  The insertion site was dilated and the introducer was carefully inserted and sutured in place. The PAC was checked, and floated into the PA.  Once in the PA, the catheter was secured. The patient tolerated the procedure well.  CXR was ordered for PACU. [id number] End: 0715 J. Tedra Senegal, MD

## 2013-08-23 NOTE — Progress Notes (Signed)
  Echocardiogram Echocardiogram Transesophageal has been performed.  Michael Maddox 08/23/2013, 8:51 AM

## 2013-08-23 NOTE — Interval H&P Note (Signed)
History and Physical Interval Note:  08/23/2013 7:15 AM  Michael Maddox  has presented today for surgery, with the diagnosis of CAD  The various methods of treatment have been discussed with the patient and family. After consideration of risks, benefits and other options for treatment, the patient has consented to  Procedure(s): CORONARY ARTERY BYPASS GRAFTING (CABG) (N/A) INTRAOPERATIVE TRANSESOPHAGEAL ECHOCARDIOGRAM (N/A) as a surgical intervention .  The patient's history has been reviewed, patient examined, no change in status, stable for surgery.  I have reviewed the patient's chart and labs.  Questions were answered to the patient's satisfaction.     Melrose Nakayama

## 2013-08-24 ENCOUNTER — Encounter (HOSPITAL_COMMUNITY): Payer: Self-pay | Admitting: Thoracic Surgery (Cardiothoracic Vascular Surgery)

## 2013-08-24 ENCOUNTER — Inpatient Hospital Stay (HOSPITAL_COMMUNITY): Payer: BC Managed Care – PPO

## 2013-08-24 LAB — POCT I-STAT, CHEM 8
BUN: 14 mg/dL (ref 6–23)
CALCIUM ION: 1.17 mmol/L (ref 1.13–1.30)
CHLORIDE: 99 meq/L (ref 96–112)
Creatinine, Ser: 1.1 mg/dL (ref 0.50–1.35)
Glucose, Bld: 146 mg/dL — ABNORMAL HIGH (ref 70–99)
HCT: 32 % — ABNORMAL LOW (ref 39.0–52.0)
Hemoglobin: 10.9 g/dL — ABNORMAL LOW (ref 13.0–17.0)
Potassium: 4.5 mEq/L (ref 3.7–5.3)
Sodium: 140 mEq/L (ref 137–147)
TCO2: 23 mmol/L (ref 0–100)

## 2013-08-24 LAB — CBC
HEMATOCRIT: 30.1 % — AB (ref 39.0–52.0)
HEMATOCRIT: 29.3 % — AB (ref 39.0–52.0)
HEMOGLOBIN: 9.8 g/dL — AB (ref 13.0–17.0)
Hemoglobin: 9.5 g/dL — ABNORMAL LOW (ref 13.0–17.0)
MCH: 29.4 pg (ref 26.0–34.0)
MCH: 29.6 pg (ref 26.0–34.0)
MCHC: 32.6 g/dL (ref 30.0–36.0)
MCHC: 32.4 g/dL (ref 30.0–36.0)
MCV: 90.4 fL (ref 78.0–100.0)
MCV: 91.3 fL (ref 78.0–100.0)
Platelets: 124 10*3/uL — ABNORMAL LOW (ref 150–400)
Platelets: 128 10*3/uL — ABNORMAL LOW (ref 150–400)
RBC: 3.33 MIL/uL — ABNORMAL LOW (ref 4.22–5.81)
RBC: 3.21 MIL/uL — AB (ref 4.22–5.81)
RDW: 13 % (ref 11.5–15.5)
RDW: 13 % (ref 11.5–15.5)
WBC: 11.7 10*3/uL — ABNORMAL HIGH (ref 4.0–10.5)
WBC: 14 10*3/uL — AB (ref 4.0–10.5)

## 2013-08-24 LAB — BASIC METABOLIC PANEL
BUN: 12 mg/dL (ref 6–23)
CALCIUM: 7.8 mg/dL — AB (ref 8.4–10.5)
CO2: 24 mEq/L (ref 19–32)
CREATININE: 0.95 mg/dL (ref 0.50–1.35)
Chloride: 106 mEq/L (ref 96–112)
GFR calc Af Amer: >90 mL/min (ref >90)
GFR, EST NON AFRICAN AMERICAN: 87 mL/min — AB (ref >90)
GLUCOSE: 119 mg/dL — AB (ref 70–99)
Potassium: 5 mEq/L (ref 3.7–5.3)
Sodium: 140 mEq/L (ref 137–147)

## 2013-08-24 LAB — GLUCOSE, CAPILLARY
GLUCOSE-CAPILLARY: 111 mg/dL — AB (ref 70–99)
GLUCOSE-CAPILLARY: 110 mg/dL — AB (ref 70–99)
GLUCOSE-CAPILLARY: 117 mg/dL — AB (ref 70–99)
GLUCOSE-CAPILLARY: 135 mg/dL — AB (ref 70–99)
GLUCOSE-CAPILLARY: 143 mg/dL — AB (ref 70–99)
GLUCOSE-CAPILLARY: 123 mg/dL — AB (ref 70–99)
Glucose-Capillary: 102 mg/dL — ABNORMAL HIGH (ref 70–99)
Glucose-Capillary: 114 mg/dL — ABNORMAL HIGH (ref 70–99)
Glucose-Capillary: 119 mg/dL — ABNORMAL HIGH (ref 70–99)
Glucose-Capillary: 121 mg/dL — ABNORMAL HIGH (ref 70–99)
Glucose-Capillary: 132 mg/dL — ABNORMAL HIGH (ref 70–99)
Glucose-Capillary: 149 mg/dL — ABNORMAL HIGH (ref 70–99)
Glucose-Capillary: 166 mg/dL — ABNORMAL HIGH (ref 70–99)
Glucose-Capillary: 178 mg/dL — ABNORMAL HIGH (ref 70–99)
Glucose-Capillary: 217 mg/dL — ABNORMAL HIGH (ref 70–99)
Glucose-Capillary: 93 mg/dL (ref 70–99)
Glucose-Capillary: 98 mg/dL (ref 70–99)

## 2013-08-24 LAB — MAGNESIUM
Magnesium: 2.8 mg/dL — ABNORMAL HIGH (ref 1.5–2.5)
Magnesium: 2.6 mg/dL — ABNORMAL HIGH (ref 1.5–2.5)

## 2013-08-24 LAB — CREATININE, SERUM
CREATININE: 1.04 mg/dL (ref 0.50–1.35)
GFR, EST AFRICAN AMERICAN: 87 mL/min — AB (ref >90)
GFR, EST NON AFRICAN AMERICAN: 75 mL/min — AB (ref >90)

## 2013-08-24 MED ORDER — INSULIN DETEMIR 100 UNIT/ML ~~LOC~~ SOLN
20.0000 [IU] | Freq: Every day | SUBCUTANEOUS | Status: DC
  Administered 2013-08-25 – 2013-08-28 (×4): 20 [IU] via SUBCUTANEOUS
  Filled 2013-08-24 (×7): qty 0.2

## 2013-08-24 MED ORDER — NEBIVOLOL HCL 5 MG PO TABS
5.0000 mg | ORAL_TABLET | Freq: Every day | ORAL | Status: DC
  Administered 2013-08-24 – 2013-08-27 (×4): 5 mg via ORAL
  Filled 2013-08-24 (×7): qty 1

## 2013-08-24 MED ORDER — INSULIN DETEMIR 100 UNIT/ML ~~LOC~~ SOLN
20.0000 [IU] | Freq: Once | SUBCUTANEOUS | Status: AC
  Administered 2013-08-24: 20 [IU] via SUBCUTANEOUS
  Filled 2013-08-24: qty 0.2

## 2013-08-24 MED ORDER — ENOXAPARIN SODIUM 40 MG/0.4ML ~~LOC~~ SOLN
40.0000 mg | Freq: Every day | SUBCUTANEOUS | Status: DC
  Administered 2013-08-24 – 2013-08-28 (×5): 40 mg via SUBCUTANEOUS
  Filled 2013-08-24 (×6): qty 0.4

## 2013-08-24 MED ORDER — FUROSEMIDE 10 MG/ML IJ SOLN
40.0000 mg | Freq: Once | INTRAMUSCULAR | Status: AC
  Administered 2013-08-24: 40 mg via INTRAVENOUS

## 2013-08-24 MED ORDER — CLOPIDOGREL BISULFATE 75 MG PO TABS
75.0000 mg | ORAL_TABLET | Freq: Every day | ORAL | Status: DC
  Administered 2013-08-24 – 2013-08-29 (×6): 75 mg via ORAL
  Filled 2013-08-24 (×9): qty 1

## 2013-08-24 MED ORDER — INSULIN ASPART 100 UNIT/ML ~~LOC~~ SOLN
0.0000 [IU] | SUBCUTANEOUS | Status: DC
  Administered 2013-08-24: 2 [IU] via SUBCUTANEOUS
  Administered 2013-08-24: 6 [IU] via SUBCUTANEOUS
  Administered 2013-08-24 – 2013-08-25 (×2): 2 [IU] via SUBCUTANEOUS

## 2013-08-24 MED ORDER — METOCLOPRAMIDE HCL 5 MG/ML IJ SOLN
10.0000 mg | Freq: Four times a day (QID) | INTRAMUSCULAR | Status: AC
  Administered 2013-08-24 (×4): 10 mg via INTRAVENOUS
  Filled 2013-08-24 (×4): qty 2

## 2013-08-24 NOTE — Progress Notes (Signed)
1 Day Post-Op Procedure(s) (LRB): CORONARY ARTERY BYPASS GRAFTING (CABG) (N/A) INTRAOPERATIVE TRANSESOPHAGEAL ECHOCARDIOGRAM (N/A) Subjective: Some incisional discomfort Nausea last night- none at present  Objective: Vital signs in last 24 hours: Temp:  [94.5 F (34.7 C)-99 F (37.2 C)] 98.6 F (37 C) (05/05 0715) Pulse Rate:  [70-154] 70 (05/05 0715) Cardiac Rhythm:  [-] Atrial paced (05/05 0600) Resp:  [10-33] 20 (05/05 0715) BP: (92-123)/(49-69) 108/50 mmHg (05/05 0600) SpO2:  [90 %-100 %] 98 % (05/05 0715) Arterial Line BP: (65-154)/(38-69) 136/53 mmHg (05/05 0715) FiO2 (%):  [40 %-50 %] 40 % (05/04 1600) Weight:  [166 lb 10.7 oz (75.6 kg)] 166 lb 10.7 oz (75.6 kg) (05/05 0500)  Hemodynamic parameters for last 24 hours: PAP: (19-43)/(5-24) 38/20 mmHg CO:  [3.6 L/min-5.2 L/min] 4.4 L/min CI:  [1.9 L/min/m2-2.7 L/min/m2] 2.3 L/min/m2  Intake/Output from previous day: 05/04 0701 - 05/05 0700 In: 6208 [I.V.:4313; Blood:445; IV Piggyback:1450] Out: 5900 [Urine:4055; Blood:1525; Chest Tube:320] Intake/Output this shift:    General appearance: alert and no distress Neurologic: intact Heart: regular rate and rhythm Lungs: diminished breath sounds bibasilar Abdomen: mildly distended, tympanitic, nontender  Lab Results:  Recent Labs  08/23/13 1845 08/23/13 1900 08/24/13 0455  WBC 9.8  --  11.7*  HGB 9.9* 10.2* 9.8*  HCT 30.0* 30.0* 30.1*  PLT 134*  --  124*   BMET:  Recent Labs  08/23/13 1900 08/24/13 0455  NA 142 140  K 5.1 5.0  CL 106 106  CO2  --  24  GLUCOSE 120* 119*  BUN 9 12  CREATININE 0.90 0.95  CALCIUM  --  7.8*    PT/INR:  Recent Labs  08/23/13 1345  LABPROT 16.6*  INR 1.38   ABG    Component Value Date/Time   PHART 7.308* 08/23/2013 1856   HCO3 22.8 08/23/2013 1856   TCO2 23 08/23/2013 1900   ACIDBASEDEF 3.0* 08/23/2013 1856   O2SAT 98.0 08/23/2013 1856   CBG (last 3)   Recent Labs  08/23/13 2204 08/23/13 2306 08/24/13 0016  GLUCAP  98 102* 114*    Assessment/Plan: S/P Procedure(s) (LRB): CORONARY ARTERY BYPASS GRAFTING (CABG) (N/A) INTRAOPERATIVE TRANSESOPHAGEAL ECHOCARDIOGRAM (N/A) POD # 1 CV- good index- dc swan  Still a paced for bradycardia  Dc metoprolol, restart bystolic  Resume plavix  RESP- IS for bibasilar atelectasis  RENAL- lytes, creatinine OK- diurese  ENDO- CBG well controlled  Anemia secondary to ABL- mild, follow  Add lovenox to SCD for DVT prophylaxis  DC chest tubes  OOB to chair, ambulate     LOS: 1 day    Melrose Nakayama 08/24/2013

## 2013-08-24 NOTE — Progress Notes (Signed)
Patient ID: Michael Maddox, male   DOB: 03-11-1951, 63 y.o.   MRN: [id number] EVENING ROUNDS NOTE :     [address]       [place] [address]             [phone]                 1 Day Post-Op Procedure(s) (LRB): CORONARY ARTERY BYPASS GRAFTING (CABG) (N/A) INTRAOPERATIVE TRANSESOPHAGEAL ECHOCARDIOGRAM (N/A)  Total Length of Stay:  LOS: 1 day  BP 103/51  Pulse 70  Temp(Src) 97.6 F (36.4 C) (Oral)  Resp 18  Ht 6' (1.829 m)  Wt 166 lb 10.7 oz (75.6 kg)  BMI 22.60 kg/m2  SpO2 91%  .Intake/Output     05/05 0701 - 05/06 0700   P.O. 580   I.V. (mL/kg) 299.3 (4)   Blood    IV Piggyback 50   Total Intake(mL/kg) 929.3 (12.3)   Urine (mL/kg/hr) 1210 (1.3)   Blood    Chest Tube 30 (0)   Total Output 1240   Net -310.7         . sodium chloride 20 mL/hr at 08/24/13 0700  . sodium chloride 10 mL/hr at 08/24/13 0500  . sodium chloride    . dexmedetomidine Stopped (08/23/13 1800)  . insulin (NOVOLIN-R) infusion 0.8 Units/hr (08/24/13 0700)  . lactated ringers 20 mL/hr at 08/24/13 0700  . nitroGLYCERIN Stopped (08/23/13 1400)  . phenylephrine (NEO-SYNEPHRINE) Adult infusion Stopped (08/24/13 0600)     Lab Results  Component Value Date   WBC 14.0* 08/24/2013   HGB 9.5* 08/24/2013   HCT 29.3* 08/24/2013   PLT 128* 08/24/2013   GLUCOSE 146* 08/24/2013   CHOL  Value: 127        ATP III CLASSIFICATION:  <200     mg/dL   Desirable  200-239  mg/dL   Borderline High  >=240    mg/dL   High        02/23/2009   TRIG 102 02/23/2009   HDL 24* 02/23/2009   LDLCALC  Value: 83        Total Cholesterol/HDL:CHD Risk Coronary Heart Disease Risk Table                     Men   Women  1/2 Average Risk   3.4   3.3  Average Risk       5.0   4.4  2 X Average Risk   9.6   7.1  3 X Average Risk  23.4   11.0        Use the calculated Patient Ratio above and the CHD Risk Table to determine the patient's CHD Risk.        ATP III CLASSIFICATION (LDL):  <100     mg/dL   Optimal  100-129  mg/dL    Near or Above                    Optimal  130-159  mg/dL   Borderline  160-189  mg/dL   High  >190     mg/dL   Very High 02/23/2009   ALT 25 2020/10/3013   AST 27 2020/10/3013   NA 140 08/24/2013   K 4.5 08/24/2013   CL 99 08/24/2013   CREATININE 1.04 08/24/2013   BUN 14 08/24/2013   CO2 24 08/24/2013   TSH 3.360 08/10/2013   INR 1.38 08/23/2013   HGBA1C 6.2* 2020/10/3013  Stable following cabg yesterday Up to chair  Grace Isaac MD  Beeper [phone] Office [phone] 08/24/2013 7:19 PM

## 2013-08-24 NOTE — Progress Notes (Signed)
Utilization Review Completed.Neoma Laming T Dowell5/08/2013

## 2013-08-24 NOTE — Op Note (Signed)
Michael Maddox NO.:  [id number]  MEDICAL RECORD NO.:  [id number]  LOCATION:  2S10C                        FACILITY:  [facility]  PHYSICIAN:  Michael Maddox. Michael Maddox, M.D.DATE OF BIRTH:  [birth year]  DATE OF PROCEDURE:  08/23/2013 DATE OF DISCHARGE:                              OPERATIVE REPORT   PREOPERATIVE DIAGNOSES:  Severe three-vessel coronary disease with angina.  POSTOPERATIVE DIAGNOSES:  Severe three-vessel coronary disease with angina.  PROCEDURE:  Median sternotomy, extracorporeal circulation, coronary artery bypass grafting x3 (Free left internal mammary artery to left anterior descending, saphenous vein graft to obtuse marginal 1, saphenous vein graft to posterior descending), and endoscopic vein harvest- left thigh.  SURGEON:  Michael Maddox. Michael Maddox, M.D.  ASSISTANT:  Michael Maddox, P.A.-C.  ANESTHESIA:  General.  FINDINGS:  Fair quality target vessels, good quality conduits, left mammary used as a free graft due to subclavian stenosis. Transesophageal echocardiography showed good left ventricular function with no significant valvular pathology.  CLINICAL NOTE:  Michael Maddox is a 63 year old gentleman with multiple cardiac risk factors and a history of severe premature atherosclerotic cardiovascular disease.  He presented with claudication but also with chest heaviness and palpitations.  At catheterization, he was found to have severe three-vessel coronary disease with an ejection fraction of 50%.  There also was 60% left subclavian stenosis with 20 mmHg gradient across the lesion.  The patient was advised to undergo coronary artery bypass grafting.  The indications, risks, benefits, and alternatives were discussed in detail with the patient.  He had been on Plavix prior to surgery. After allowing for adequate time for the Plavix to wash out, the patient is now felt ready to proceed.  OPERATIVE NOTE:  Michael Maddox was brought to the preoperative  holding area on Aug 23, 2013.  There, Anesthesia placed a Swan-Ganz catheter and an arterial blood pressure monitoring line.  Intravenous antibiotics were administered.  He was taken to the operating room, anesthetized, and intubated.  A Foley catheter was placed.  Transesophageal echocardiography was performed by Dr. Tamela Maddox.  It revealed good left ventricular systolic function.  There was left ventricular hypertrophy. There was no significant valvular pathology.  The chest, abdomen, and legs were prepped and draped in usual sterile fashion.  An incision was made in the medial aspect of the left leg at the level of the knee.  Greater saphenous vein was identified, it was harvested endoscopically.  Simultaneously with this, a median sternotomy was performed and the left internal mammary artery was harvested using Maddox technique.  The saphenous vein was thin walled, but otherwise a good quality conduit.  The mammary artery was in spasm when taken down but application of papaverine and gently probing the vessel resulted in good flow.  It was a 1.5-mm conduit.  The mammary was left in papaverine- soaked sponge and divided later in the procedure.  The patient was fully heparinized.  The pericardium was opened after confirming adequate anticoagulation with ACT measurement.  The aorta was cannulated via concentric 2-0 Ethibond pledgeted pursestring sutures.  A dual-stage venous cannula was placed via pursestring suture in the right atrial appendage. Cardiopulmonary bypass was instituted and the patient was cooled  to 32 degrees Celsius.  The coronary arteries were inspected and anastomotic sites were chosen.  The conduits were inspected and cut to length.  A foam pad was placed in the pericardium to insulate the heart and protect the left phrenic nerve.  A temperature probe was placed in the myocardial septum and a cardioplegic cannula was placed in the ascending aorta.  The aorta was  crossclamped.  The left ventricle was emptied via the aortic root vent.  Cardiac arrest then was achieved with a combination of cold antegrade blood cardioplegia and topical iced saline. 1 L of cardioplegia was administered.  There was a rapid diastolic arrest. There was myocardial septal cooling to 11 degrees Celsius.  The following distal anastomoses then were performed.  First, a reversed saphenous vein graft was placed end-to-side to the posterior descending branch of the right coronary.  The vein was larger in caliber than the coronary.  The coronary had diffuse disease. It did accept a 1.5-mm probe.  It was fair quality.  The vein was anastomosed end-to-side with a running 7-0 Prolene suture.  All anastomoses were probed proximally and distally to ensure their patency at their completion.  Cardioplegia was administered at the completion of each vein graft to assess flow and hemostasis, both were good.  Next, a reversed saphenous vein graft was placed end-to-side to the first obtuse marginal.  This vessel had 70% proximal stenosis.  It was a fair quality vessel, it did accept a 1.5-mm probe proximally but was grafted relatively distal in the vessel and only accepted a 1-mm probe distally. Vein graft was anastomosed end-to-side with a running 7-0 Prolene suture.  Again, there was good flow and good hemostasis with cardioplegia administration.  Additional cardioplegia then was administered down the aortic root as well.  Next, the mammary artery was clamped proximally and divided.  The proximal stump was suture ligated with 2-0 silk suture, and a clip was placed on the artery as well.  The distal end of the left mammary was beveled and was anastomosed end-to-side to the distal LAD.  The LAD was a 1.5-mm target, it did have diffuse disease but a probe did pass easily proximally and distally.  The mammary was 1.5-mm conduit, it was anastomosed end-to-side with a running 8-0 Prolene  suture, a probe passed easily proximally and distally.  At the completion of the graft, cardioplegia was administered once again through the veins and the aortic root and there was good backbleeding from the mammary and a soft bulldog was placed on the mammary proximally.  The vein grafts were cut to length.  The proximal vein graft anastomoses then were performed to 4.5 mm punch aortotomies with running 6-0 Prolene sutures.  A venotomy was made in the hood of the vein graft to the OM for placement of the mammary proximal which was taken off this as a Y graft, this anastomosis was performed with a running 7-0 Prolene suture. At the completion of this anastomosis, the patient was placed in Trendelenburg position.  Lidocaine was administered.  The left ventricle and aortic root were de-aired, and the aortic crossclamp was removed. Total crossclamp time was 70 minutes.  The patient spontaneously resumed sinus rhythm.  While rewarming was completed, all proximal and distal anastomoses were inspected for hemostasis and epicardial pacing wires were placed on the right ventricle and right atrium.  The patient was bradycardic and initially atrially paced at 80 beats per minute; however, he was missing beats so it was changed to  DDD pacing at 80 beats per minute.  When the patient had rewarmed to a core temperature of 37 degrees Celsius, he was weaned from cardiopulmonary bypass on the first attempt without inotropic support.  The total bypass time was 102 minutes.  The initial cardiac index was greater than 2 liters/minute/meter squared.  The patient remained hemodynamically stable throughout the postbypass.  Postbypass transesophageal echocardiography revealed no change in left ventricular function.  A test dose of protamine was administered and was well tolerated.  The atrial and aortic cannulae were removed.  The remainder of the protamine was administered without incident.  Chest was  irrigated with warm saline.  Hemostasis was achieved.  The pericardium was reapproximated over the ascending aorta and base of the heart with interrupted 3-0 silk sutures.  A left pleural and mediastinal chest tubes were placed through separate subcostal incisions and secured with #1 silk sutures.  The sternum was closed with combination of single and double heavy gauge stainless steel wires.  There was no hemodynamic change with sternal closure.  The remainder of the incision was closed in Maddox fashion. All sponge, needle, and instrument counts were correct at the end of the procedure.  The patient was taken from the operating room to the surgical intensive care unit in good condition.     Michael Maddox Michael Maddox, M.D.     SCH/MEDQ  D:  08/23/2013  T:  08/24/2013  Job:  [id number]

## 2013-08-25 ENCOUNTER — Inpatient Hospital Stay (HOSPITAL_COMMUNITY): Payer: BC Managed Care – PPO

## 2013-08-25 LAB — GLUCOSE, CAPILLARY
GLUCOSE-CAPILLARY: 127 mg/dL — AB (ref 70–99)
GLUCOSE-CAPILLARY: 140 mg/dL — AB (ref 70–99)
GLUCOSE-CAPILLARY: 140 mg/dL — AB (ref 70–99)
Glucose-Capillary: 120 mg/dL — ABNORMAL HIGH (ref 70–99)
Glucose-Capillary: 144 mg/dL — ABNORMAL HIGH (ref 70–99)
Glucose-Capillary: 149 mg/dL — ABNORMAL HIGH (ref 70–99)

## 2013-08-25 LAB — CBC
HCT: 28 % — ABNORMAL LOW (ref 39.0–52.0)
Hemoglobin: 9.3 g/dL — ABNORMAL LOW (ref 13.0–17.0)
MCH: 29.9 pg (ref 26.0–34.0)
MCHC: 33.2 g/dL (ref 30.0–36.0)
MCV: 90 fL (ref 78.0–100.0)
Platelets: 130 10*3/uL — ABNORMAL LOW (ref 150–400)
RBC: 3.11 MIL/uL — ABNORMAL LOW (ref 4.22–5.81)
RDW: 13.1 % (ref 11.5–15.5)
WBC: 12 10*3/uL — ABNORMAL HIGH (ref 4.0–10.5)

## 2013-08-25 LAB — BASIC METABOLIC PANEL
BUN: 17 mg/dL (ref 6–23)
CO2: 27 mEq/L (ref 19–32)
CREATININE: 0.91 mg/dL (ref 0.50–1.35)
Calcium: 8.3 mg/dL — ABNORMAL LOW (ref 8.4–10.5)
Chloride: 99 mEq/L (ref 96–112)
GFR, EST NON AFRICAN AMERICAN: 89 mL/min — AB (ref >90)
Glucose, Bld: 137 mg/dL — ABNORMAL HIGH (ref 70–99)
POTASSIUM: 4.3 meq/L (ref 3.7–5.3)
Sodium: 136 mEq/L — ABNORMAL LOW (ref 137–147)

## 2013-08-25 MED ORDER — MOVING RIGHT ALONG BOOK
Freq: Once | Status: AC
  Filled 2013-08-25: qty 1

## 2013-08-25 MED ORDER — SODIUM CHLORIDE 0.9 % IJ SOLN: 3.0000 mL | INTRAMUSCULAR | Status: DC | PRN

## 2013-08-25 MED ORDER — SODIUM CHLORIDE 0.9 % IJ SOLN
3.0000 mL | Freq: Two times a day (BID) | INTRAMUSCULAR | Status: DC
Start: 2013-08-25 — End: 2013-08-29
  Administered 2013-08-25 – 2013-08-29 (×8): 3 mL via INTRAVENOUS

## 2013-08-25 MED ORDER — MAGNESIUM HYDROXIDE 400 MG/5ML PO SUSP
30.0000 mL | Freq: Every day | ORAL | Status: DC | PRN
  Filled 2013-08-25: qty 30

## 2013-08-25 MED ORDER — SODIUM CHLORIDE 0.9 % IV SOLN: 250.0000 mL | INTRAVENOUS | Status: DC | PRN

## 2013-08-25 MED ORDER — GUAIFENESIN-DM 100-10 MG/5ML PO SYRP: 15.0000 mL | ORAL_SOLUTION | ORAL | Status: DC | PRN

## 2013-08-25 MED ORDER — ZOLPIDEM TARTRATE 5 MG PO TABS
10.0000 mg | ORAL_TABLET | Freq: Every evening | ORAL | Status: DC | PRN
  Administered 2013-08-25: 10 mg via ORAL
  Filled 2013-08-25: qty 2

## 2013-08-25 MED ORDER — INSULIN ASPART 100 UNIT/ML ~~LOC~~ SOLN
0.0000 [IU] | Freq: Three times a day (TID) | SUBCUTANEOUS | Status: DC
  Administered 2013-08-25: 2 [IU] via SUBCUTANEOUS

## 2013-08-25 MED ORDER — POTASSIUM CHLORIDE CRYS ER 20 MEQ PO TBCR
20.0000 meq | EXTENDED_RELEASE_TABLET | Freq: Once | ORAL | Status: AC
  Administered 2013-08-25: 20 meq via ORAL
  Filled 2013-08-25: qty 1

## 2013-08-25 MED ORDER — ALPRAZOLAM 0.25 MG PO TABS: 0.2500 mg | ORAL_TABLET | Freq: Four times a day (QID) | ORAL | Status: DC | PRN

## 2013-08-25 MED ORDER — FUROSEMIDE 40 MG PO TABS
40.0000 mg | ORAL_TABLET | Freq: Every day | ORAL | Status: DC
  Administered 2013-08-25: 40 mg via ORAL
  Filled 2013-08-25 (×2): qty 1

## 2013-08-25 MED ORDER — ALUM & MAG HYDROXIDE-SIMETH 200-200-20 MG/5ML PO SUSP: 15.0000 mL | ORAL | Status: DC | PRN

## 2013-08-25 MED FILL — Heparin Sodium (Porcine) Inj 1000 Unit/ML: INTRAMUSCULAR | Qty: 30 | Status: AC

## 2013-08-25 MED FILL — Potassium Chloride Inj 2 mEq/ML: INTRAVENOUS | Qty: 40 | Status: AC

## 2013-08-25 MED FILL — Magnesium Sulfate Inj 50%: INTRAMUSCULAR | Qty: 10 | Status: AC

## 2013-08-25 NOTE — Progress Notes (Signed)
CARDIAC REHAB PHASE I   PRE:  Rate/Rhythm: 77 SR  BP:  Supine: 110/68  Sitting:   Standing:    SaO2: 95%4L  MODE:  Ambulation: 500 ft   POST:  Rate/Rhythm: 83  BP:  Supine:   Sitting: 126/66  Standing:    SaO2: 3L would not register, put on 4L and 92% 1400-1435 Pt has walked twice already but agrees to walk again. Pt walked 500 ft on 3L with rolling walker and asst x 1 with steady gait. Tolerated well. To bed after walk. No DOE with walk.   Graylon Good, RN BSN  08/25/2013 2:33 PM

## 2013-08-25 NOTE — Care Management Note (Signed)
    Page 1 of 1   08/25/2013     10:33:21 AM CARE MANAGEMENT NOTE 08/25/2013  Patient:  Michael Maddox, Michael Maddox   Account Number:  [id number]  Date Initiated:  08/25/2013  Documentation initiated by:  [facility]  Subjective/Objective Assessment:   Post op CABG x4 on 5-4     Action/Plan:   Anticipated DC Date:  08/30/2013   Anticipated DC Plan:  [place]  CM consult      Choice offered to / List presented to:             Status of service:  In process, will continue to follow Medicare Important Message given?   (If response is "NO", the following Medicare IM given date fields will be blank) Date Medicare IM given:   Date Additional Medicare IM given:    Discharge Disposition:    Per UR Regulation:  Reviewed for med. necessity/level of care/duration of stay  If discussed at [place] of Stay Meetings, dates discussed:    Comments:  Contact:  Saldivar,Crystal Spouse [phone]  08-25-13 10:30am Luz Lex, RNBSN [phone] Patient sitting up in chair - getting ready to progress to 2W unit.  States lives with with who will be available 24/7 post op.  States also has a son that will assist.  CM will continue to follow for further needs.

## 2013-08-25 NOTE — Progress Notes (Signed)
2 Days Post-Op Procedure(s) (LRB): CORONARY ARTERY BYPASS GRAFTING (CABG) (N/A) INTRAOPERATIVE TRANSESOPHAGEAL ECHOCARDIOGRAM (N/A) Subjective: C/o not being able to sleep last night Pain well controlled Already ambulated this morning Denies nausea/ emesis  Objective: Vital signs in last 24 hours: Temp:  [97.6 F (36.4 C)-98.6 F (37 C)] 98 F (36.7 C) (05/06 0735) Pulse Rate:  [65-78] 77 (05/06 0700) Cardiac Rhythm:  [-] Normal sinus rhythm (05/06 0700) Resp:  [15-25] 15 (05/06 0700) BP: (92-126)/(51-91) 99/63 mmHg (05/06 0700) SpO2:  [90 %-100 %] 94 % (05/06 0700) Arterial Line BP: (141-166)/(44-56) 146/50 mmHg (05/05 1400) Weight:  [168 lb 12.8 oz (76.567 kg)] 168 lb 12.8 oz (76.567 kg) (05/06 0238)  Hemodynamic parameters for last 24 hours: PAP: (39)/(19) 39/19 mmHg CO:  [4.1 L/min] 4.1 L/min CI:  [2.3 L/min/m2] 2.3 L/min/m2  Intake/Output from previous day: 05/05 0701 - 05/06 0700 In: 1599.3 [P.O.:980; I.V.:519.3; IV Piggyback:100] Out: 1675 [Urine:1645; Chest Tube:30] Intake/Output this shift:    General appearance: alert and no distress Neurologic: intact Heart: regular rate and rhythm Lungs: diminished breath sounds bibasilar Abdomen: less distended, hypoactive BS  Lab Results:  Recent Labs  08/24/13 1637 08/25/13 0400  WBC 14.0* 12.0*  HGB 9.5* 9.3*  HCT 29.3* 28.0*  PLT 128* 130*   BMET:  Recent Labs  08/24/13 0455 08/24/13 1621 08/24/13 1637 08/25/13 0400  NA 140 140  --  136*  K 5.0 4.5  --  4.3  CL 106 99  --  99  CO2 24  --   --  27  GLUCOSE 119* 146*  --  137*  BUN 12 14  --  17  CREATININE 0.95 1.10 1.04 0.91  CALCIUM 7.8*  --   --  8.3*    PT/INR:  Recent Labs  08/23/13 1345  LABPROT 16.6*  INR 1.38   ABG    Component Value Date/Time   PHART 7.308* 08/23/2013 1856   HCO3 22.8 08/23/2013 1856   TCO2 23 08/24/2013 1621   ACIDBASEDEF 3.0* 08/23/2013 1856   O2SAT 98.0 08/23/2013 1856   CBG (last 3)   Recent Labs  08/24/13 1926  08/24/13 2325 08/25/13 0348  GLUCAP 217* 120* 127*    Assessment/Plan: S/P Procedure(s) (LRB): CORONARY ARTERY BYPASS GRAFTING (CABG) (N/A) INTRAOPERATIVE TRANSESOPHAGEAL ECHOCARDIOGRAM (N/A) Plan for transfer to step-down: see transfer orders CV- stable- on bystolic, ASA, plavix and simvastatin  RESP- continue IS  RENAL- volume overloaded- start PO lasix, lytes OK  ENDO- CBG well controlled except for single high value yesterday evening  Continue levimir, change SSI to [facility] and HS  Anemia secondary to ABL- stable, follow  Enoxaparin + SCD for DVT prophylaxis  Ambien PRN for sleep  Transfer to 2000   LOS: 2 days    Melrose Nakayama 08/25/2013

## 2013-08-26 ENCOUNTER — Encounter (HOSPITAL_COMMUNITY): Payer: Self-pay | Admitting: Radiology

## 2013-08-26 ENCOUNTER — Inpatient Hospital Stay (HOSPITAL_COMMUNITY): Payer: BC Managed Care – PPO

## 2013-08-26 LAB — BLOOD GAS, ARTERIAL
Acid-Base Excess: 2.6 mmol/L — ABNORMAL HIGH (ref 0.0–2.0)
Acid-Base Excess: 3.8 mmol/L — ABNORMAL HIGH (ref 0.0–2.0)
BICARBONATE: 25.8 meq/L — AB (ref 20.0–24.0)
BICARBONATE: 27.1 meq/L — AB (ref 20.0–24.0)
DRAWN BY: 252031
Drawn by: 252031
O2 CONTENT: 4 L/min
O2 Content: 1 L/min
O2 SAT: 78.1 %
O2 Saturation: 91.5 %
PATIENT TEMPERATURE: 98.6
PATIENT TEMPERATURE: 98.6
PH ART: 7.489 — AB (ref 7.350–7.450)
TCO2: 26.8 mmol/L (ref 0–100)
TCO2: 28.2 mmol/L (ref 0–100)
pCO2 arterial: 33.8 mmHg — ABNORMAL LOW (ref 35.0–45.0)
pCO2 arterial: 36 mmHg (ref 35.0–45.0)
pH, Arterial: 7.494 — ABNORMAL HIGH (ref 7.350–7.450)
pO2, Arterial: 42.4 mmHg — ABNORMAL LOW (ref 80.0–100.0)
pO2, Arterial: 61.3 mmHg — ABNORMAL LOW (ref 80.0–100.0)

## 2013-08-26 LAB — CBC
HCT: 29.8 % — ABNORMAL LOW (ref 39.0–52.0)
HEMOGLOBIN: 9.8 g/dL — AB (ref 13.0–17.0)
MCH: 29.5 pg (ref 26.0–34.0)
MCHC: 32.9 g/dL (ref 30.0–36.0)
MCV: 89.8 fL (ref 78.0–100.0)
Platelets: 157 10*3/uL (ref 150–400)
RBC: 3.32 MIL/uL — AB (ref 4.22–5.81)
RDW: 12.9 % (ref 11.5–15.5)
WBC: 9.4 10*3/uL (ref 4.0–10.5)

## 2013-08-26 LAB — BASIC METABOLIC PANEL
BUN: 18 mg/dL (ref 6–23)
CHLORIDE: 98 meq/L (ref 96–112)
CO2: 25 meq/L (ref 19–32)
Calcium: 8.3 mg/dL — ABNORMAL LOW (ref 8.4–10.5)
Creatinine, Ser: 0.94 mg/dL (ref 0.50–1.35)
GFR calc Af Amer: >90 mL/min (ref >90)
GFR calc non Af Amer: 88 mL/min — ABNORMAL LOW (ref >90)
GLUCOSE: 154 mg/dL — AB (ref 70–99)
Potassium: 4.3 mEq/L (ref 3.7–5.3)
SODIUM: 137 meq/L (ref 137–147)

## 2013-08-26 LAB — GLUCOSE, CAPILLARY
GLUCOSE-CAPILLARY: 130 mg/dL — AB (ref 70–99)
Glucose-Capillary: 198 mg/dL — ABNORMAL HIGH (ref 70–99)
Glucose-Capillary: 151 mg/dL — ABNORMAL HIGH (ref 70–99)
Glucose-Capillary: 141 mg/dL — ABNORMAL HIGH (ref 70–99)

## 2013-08-26 MED ORDER — FUROSEMIDE 40 MG PO TABS
40.0000 mg | ORAL_TABLET | Freq: Every day | ORAL | Status: DC
  Administered 2013-08-27 – 2013-08-29 (×3): 40 mg via ORAL
  Filled 2013-08-26 (×3): qty 1

## 2013-08-26 MED ORDER — POTASSIUM CHLORIDE CRYS ER 20 MEQ PO TBCR
20.0000 meq | EXTENDED_RELEASE_TABLET | Freq: Two times a day (BID) | ORAL | Status: DC
  Administered 2013-08-26 – 2013-08-29 (×7): 20 meq via ORAL
  Filled 2013-08-26 (×8): qty 1

## 2013-08-26 MED ORDER — FUROSEMIDE 10 MG/ML IJ SOLN
40.0000 mg | Freq: Once | INTRAMUSCULAR | Status: AC
  Administered 2013-08-26: 40 mg via INTRAVENOUS
  Filled 2013-08-26: qty 4

## 2013-08-26 MED ORDER — TRAMADOL HCL 50 MG PO TABS
50.0000 mg | ORAL_TABLET | Freq: Four times a day (QID) | ORAL | Status: DC | PRN
  Administered 2013-08-29: 100 mg via ORAL
  Filled 2013-08-26: qty 2

## 2013-08-26 MED ORDER — DIPHENHYDRAMINE HCL 25 MG PO CAPS
25.0000 mg | ORAL_CAPSULE | Freq: Every evening | ORAL | Status: DC | PRN
Start: 2013-08-26 — End: 2013-08-29

## 2013-08-26 NOTE — Progress Notes (Signed)
After the fall, reassessment was 12 fall score and patient was put on high fall risk. Patient attempted to go to the bathroom. I found him on the floor with a cut above his right eye brow. I applied dressing to stop the bleeding. Patient still insisted on going to the bathroom. Thereafter, we sat him on the chair, and he walked back to bed with assistance. Patient was put on bed alarm. Patient was alert and oriented to person, place, time and situation prior to fall. Immediately after, patient was still alert X 4. However when assessed later, patient was alert X 3 but not to situation. Patient did not remember falling. Dr Roxy Manns was notified and vital signs were taken. Chest x-ray, CT scan, arterial blood gases were ordered and done. I escorted the patient to 2 S on a transfer.

## 2013-08-26 NOTE — Significant Event (Addendum)
Rapid Response Event Note  Overview: Time Called: 0337 Arrival Time: 0340 Event Type: Neurologic;Respiratory  Initial Focused Assessment: Called by bedside RN after request from MD for RRT to see patient s/p fall from bed. Bedside RN reports that only the patient's oxygen saturations have changed and that there is an 1/2 inch gash above patient's right eyebrow from the fall. Upon arrival to the room, patient lethargic in bed, denies pain, oriented to self, time, and place. Patient is unable to carry on a conversation, unaware of falling, and is unsure why he is in the hospital. Patient follows commands and has equal strength in all extremites. Bedside RN states that the confusion is new for the patient and that this is not how the patient was when he was helped up from the floor at approximately 0315. Patient was found on the floor at 0315 by nursing staff after patient attempted to use the urinal and slipped on urine on the floor per nursing staff. Since the fall patient's O2 sats have ranged from 77-87% on 4L [place]. RR 16, HR 74, BP 123/42, CBG WNL. Skin warm and dry, surgical dressings are CDI. Patient did receive an evening dose of Ambien at 2230.  Interventions: Chest xray and ABG ordered per protocol. NRB placed on patient based upon ABG results (pH 7.49, pCO2 34, pO2 42, Bicarb 25). Chest xray impression similar to yesterday. MD repaged after ABG results and to clarify that the patient's mental status had changed. Orders received for STAT head CT, follow up ABG, and transfer to ICU. Patient transported to CT with bedside RN and RRT RN with no issues. Patient taken directly to 2S04 after CT head obtained. Report given to bedside RN. Will continue to monitor, advised bedside RN to call with further needs  Event Summary: Name of Physician Notified: Dr. Roxy Manns at [place]    at    Outcome: Transferred (Comment) [phone])  Event End Time: [place]

## 2013-08-26 NOTE — Progress Notes (Signed)
Pt disoriented and agitated upon arrival to 2 South. Pt unable to state where he is or why he is here. Pt doesn't remember having a fall. Pt trying to get out of bed and taking off the nonrebreather mask. Staff attempted to reorient him and reassure him. Will continue to monitor.  Jessie Takoda Siedlecki, RN 

## 2013-08-26 NOTE — Progress Notes (Addendum)
3 Days Post-Op Procedure(s) (LRB): CORONARY ARTERY BYPASS GRAFTING (CABG) (N/A) INTRAOPERATIVE TRANSESOPHAGEAL ECHOCARDIOGRAM (N/A) Subjective: Events of earlier this AM noted Fell from bed and hit head, then became disoriented and sats dropped Transferred back to SICU  Currently awake and alert, upset about events - doesn't recall his fall He is cooperative, conversant and oriented  Objective: Vital signs in last 24 hours: Temp:  [98.5 F (36.9 C)-98.8 F (37.1 C)] 98.5 F (36.9 C) (05/07 0326) Pulse Rate:  [49-110] 90 (05/07 0700) Cardiac Rhythm:  [-] Normal sinus rhythm (05/07 0600) Resp:  [14-24] 21 (05/07 0700) BP: (95-192)/(31-85) 192/81 mmHg (05/07 0700) SpO2:  [78 %-99 %] 94 % (05/07 0700) Weight:  [171 lb 4.8 oz (77.7 kg)] 171 lb 4.8 oz (77.7 kg) (05/07 0500)  Hemodynamic parameters for last 24 hours:    Intake/Output from previous day: 05/06 0701 - 05/07 0700 In: 500 [P.O.:480; I.V.:20] Out: 275 [Urine:275] Intake/Output this shift: Total I/O In: -  Out: 300 [Urine:300]  General appearance: alert, cooperative and no distress Neurologic: intact Heart: regular rate and rhythm Lungs: diminished breath sounds bibasilar Abdomen: normal findings: soft, non-tender  Lab Results:  Recent Labs  08/25/13 0400 08/26/13 0640  WBC 12.0* 9.4  HGB 9.3* 9.8*  HCT 28.0* 29.8*  PLT 130* 157   BMET:  Recent Labs  08/24/13 0455 08/24/13 1621 08/24/13 1637 08/25/13 0400  NA 140 140  --  136*  K 5.0 4.5  --  4.3  CL 106 99  --  99  CO2 24  --   --  27  GLUCOSE 119* 146*  --  137*  BUN 12 14  --  17  CREATININE 0.95 1.10 1.04 0.91  CALCIUM 7.8*  --   --  8.3*    PT/INR:  Recent Labs  08/23/13 1345  LABPROT 16.6*  INR 1.38   ABG    Component Value Date/Time   PHART 7.489* 08/26/2013 0512   HCO3 27.1* 08/26/2013 0512   TCO2 28.2 08/26/2013 0512   ACIDBASEDEF 3.0* 08/23/2013 1856   O2SAT 91.5 08/26/2013 0512   CBG (last 3)   Recent Labs  08/25/13 1646  08/25/13 2059 08/26/13 0404  GLUCAP 140* 149* 130*   CT HEAD WITHOUT CONTRAST  TECHNIQUE:  Contiguous axial images were obtained from the base of the skull  through the vertex without intravenous contrast.  COMPARISON: Prior MRI from 04/24/2008  FINDINGS:  Small focus of encephalomalacia within the right parietal lobe is  consistent with remote infarct.  There is no acute intracranial hemorrhage or infarct. No mass lesion  or midline shift. Gray-white matter differentiation is well  maintained. Ventricles are normal in size without evidence of  hydrocephalus. CSF containing spaces are within normal limits. No  extra-axial fluid collection.  The calvarium is intact.  Orbital soft tissues are within normal limits.  The paranasal sinuses and mastoid air cells are well pneumatized and  free of fluid.  Scalp soft tissues are unremarkable.  IMPRESSION:  1. No acute intracranial process.  2. Remote right parietal infarct.  Electronically Signed  By: Jeannine Boga M.D.  On: 08/26/2013 05:23    Assessment/Plan: S/P Procedure(s) (LRB): CORONARY ARTERY BYPASS GRAFTING (CABG) (N/A) INTRAOPERATIVE TRANSESOPHAGEAL ECHOCARDIOGRAM (N/A) - Fell last night, later was confused and disoriented Now awake, alert and appropriate Likely a medication issue- will adjust meds Head CT showed an old stroke, nothing acute  CV- in SR, BP elevated RESP- CXR this AM showed some pulmonary edema  Wean O2,  diurese RENAL- AM labs pending, diurese for volume overload CBG OK   LOS: 3 days    Melrose Nakayama 08/26/2013

## 2013-08-26 NOTE — Progress Notes (Addendum)
On the order of the MD patient was transferred to [place]. Rapid response was also notified.

## 2013-08-26 NOTE — Progress Notes (Deleted)
Pt disoriented and agitated upon arrival to [place]. Pt unable to state where he is or why he is here. Pt doesn't remember having a fall. Pt trying to get out of bed and taking off the nonrebreather mask. Staff attempted to reorient him and reassure him. Will continue to monitor.  Vella Raring, RN

## 2013-08-27 ENCOUNTER — Inpatient Hospital Stay (HOSPITAL_COMMUNITY): Payer: BC Managed Care – PPO

## 2013-08-27 LAB — GLUCOSE, CAPILLARY
Glucose-Capillary: 84 mg/dL (ref 70–99)
Glucose-Capillary: 141 mg/dL — ABNORMAL HIGH (ref 70–99)
Glucose-Capillary: 149 mg/dL — ABNORMAL HIGH (ref 70–99)
Glucose-Capillary: 194 mg/dL — ABNORMAL HIGH (ref 70–99)

## 2013-08-27 LAB — BASIC METABOLIC PANEL
BUN: 16 mg/dL (ref 6–23)
CO2: 27 mEq/L (ref 19–32)
CREATININE: 0.99 mg/dL (ref 0.50–1.35)
Calcium: 8.6 mg/dL (ref 8.4–10.5)
Chloride: 103 mEq/L (ref 96–112)
GFR calc Af Amer: >90 mL/min (ref >90)
GFR calc non Af Amer: 86 mL/min — ABNORMAL LOW (ref >90)
Glucose, Bld: 97 mg/dL (ref 70–99)
Potassium: 4.5 mEq/L (ref 3.7–5.3)
Sodium: 141 mEq/L (ref 137–147)

## 2013-08-27 LAB — CBC
HEMATOCRIT: 28.8 % — AB (ref 39.0–52.0)
Hemoglobin: 9.4 g/dL — ABNORMAL LOW (ref 13.0–17.0)
MCH: 29.3 pg (ref 26.0–34.0)
MCHC: 32.6 g/dL (ref 30.0–36.0)
MCV: 89.7 fL (ref 78.0–100.0)
PLATELETS: 188 10*3/uL (ref 150–400)
RBC: 3.21 MIL/uL — AB (ref 4.22–5.81)
RDW: 13.1 % (ref 11.5–15.5)
WBC: 8.5 10*3/uL (ref 4.0–10.5)

## 2013-08-27 NOTE — Progress Notes (Signed)
      [address]       [place] [address]             [phone]      4 Days Post-Op Procedure(s) (LRB): CORONARY ARTERY BYPASS GRAFTING (CABG) (N/A) INTRAOPERATIVE TRANSESOPHAGEAL ECHOCARDIOGRAM (N/A)  Subjective:  Michael Maddox has no complaints this morning.  He states that he is feeling fine.  He is ambulating without difficulty.  No BM  Objective: Vital signs in last 24 hours: Temp:  [97.1 F (36.2 C)-98.9 F (37.2 C)] 97.1 F (36.2 C) (05/08 0741) Pulse Rate:  [54-91] 90 (05/08 0700) Cardiac Rhythm:  [-] Normal sinus rhythm (05/08 0200) Resp:  [15-25] 22 (05/08 0700) BP: (72-167)/(47-74) 106/64 mmHg (05/08 0600) SpO2:  [92 %-100 %] 100 % (05/08 0700) Weight:  [160 lb 15 oz (73 kg)] 160 lb 15 oz (73 kg) (05/08 0600)  Intake/Output from previous day: 05/07 0701 - 05/08 0700 In: 700 [P.O.:700] Out: 1700 [Urine:1700]  General appearance: alert, cooperative and no distress Heart: regular rate and rhythm Lungs: clear to auscultation bilaterally Abdomen: soft, non-tender; bowel sounds normal; no masses,  no organomegaly Extremities: edema trace Wound: head lac on right clean some bleeding, other incisions clean and dry  Lab Results:  Recent Labs  08/26/13 0640 08/27/13 0452  WBC 9.4 8.5  HGB 9.8* 9.4*  HCT 29.8* 28.8*  PLT 157 188   BMET:  Recent Labs  08/26/13 0640 08/27/13 0452  NA 137 141  K 4.3 4.5  CL 98 103  CO2 25 27  GLUCOSE 154* 97  BUN 18 16  CREATININE 0.94 0.99  CALCIUM 8.3* 8.6    PT/INR: No results found for this basename: LABPROT, INR,  in the last 72 hours ABG    Component Value Date/Time   PHART 7.489* 08/26/2013 0512   HCO3 27.1* 08/26/2013 0512   TCO2 28.2 08/26/2013 0512   ACIDBASEDEF 3.0* 08/23/2013 1856   O2SAT 91.5 08/26/2013 0512   CBG (last 3)   Recent Labs  08/26/13 1158 08/26/13 1639 08/26/13 2134  GLUCAP 198* 151* 141*    Assessment/Plan: S/P Procedure(s) (LRB): CORONARY ARTERY BYPASS GRAFTING  (CABG) (N/A) INTRAOPERATIVE TRANSESOPHAGEAL ECHOCARDIOGRAM (N/A)  1. CV- NSR good rate and pressure control- continue Bystolic 2. Pulm- wean oxygen as tolerated, encouraged use of IS, CXR with some atelectasis 3. Renal- creatinine WNL, weight is only 4 lbs over baseline on PO Lasix 4. CBGs controlled- patient borderline diabetic, will d/c fingersticks will need to f/u with PCP at discharge 5. Neuro- continue to hold Narcotics, sleep aides, patient alert and oriented 6. Dispo- patient doing well, can likely transfer to 2000   LOS: 4 days    Ellwood Handler 08/27/2013

## 2013-08-27 NOTE — Progress Notes (Signed)
CARDIAC REHAB PHASE I   PRE:  Rate/Rhythm: 90 paced  BP:  Supine: 102/60  Sitting:   Standing:    SaO2: 93% 1.5L  MODE:  Ambulation: 500 ft   POST:  Rate/Rhythm: 90 paced  BP:  Supine:   Sitting: 110/70  Standing:    SaO2: 92 %2L hard to register, try fingers and ear [id number] Pt walked 500 ft on 2L with rolling walker and asst x 1 with steady gait. Tolerated well. To bed after walk with bed alarm on. Wife in room. Pt using IS 10x hour per wife. Hard to get sats to register right after walk. Took a few minutes.  Wife would like to see case manager, notified her. Pt would like rolling walker for home use.    Graylon Good, RN BSN  08/27/2013 3:05 PM

## 2013-08-27 NOTE — Discharge Instructions (Signed)
Coronary Artery Bypass Grafting, Care After °Refer to this sheet in the next few weeks. These instructions provide you with information on caring for yourself after your procedure. Your health care provider may also give you more specific instructions. Your treatment has been planned according to current medical practices, but problems sometimes occur. Call your health care provider if you have any problems or questions after your procedure. °WHAT TO EXPECT AFTER THE PROCEDURE °Recovery from surgery will be different for everyone. Some people feel well after 3 or 4 weeks, while for others it takes longer. After your procedure, it is typical to have the following: °· Nausea and a lack of appetite.   °· Constipation. °· Weakness and fatigue.   °· Depression or irritability.   °· Pain or discomfort at your incision site. °HOME CARE INSTRUCTIONS °· Only take over-the-counter or prescription medicines as directed by your health care provider. Take all medicines exactly as directed. Do not stop taking medicines or start any new medicines without first checking with your health care provider.   °· Take your pulse as directed by your health care provider. °· Perform deep breathing as directed by your health care provider. If you were given a device called an incentive spirometer, use it to practice deep breathing several times a day. Support your chest with a pillow or your arms when you take deep breaths or cough. °· Keep incision areas clean, dry, and protected. Remove or change any bandages (dressings) only as directed by your health care provider. You may have skin adhesive strips over the incision areas. Do not take the strips off. They will fall off on their own. °· Check incision areas daily for any swelling, redness, or drainage. °· If incisions were made in your legs, do the following: °· Avoid crossing your legs.   °· Avoid sitting for long periods of time. Change positions every 30 minutes.   °· Elevate your legs  when you are sitting.   °· Wear compression stockings as directed by your health care provider. These stockings help keep blood clots from forming in your legs. °· Take showers once your health care provider approves. Until then, only take sponge baths. Pat incisions dry. Do not rub incisions with a washcloth or towel. Do not take tub baths or go swimming until your health care provider approves. °· Eat foods that are high in fiber, such as raw fruits and vegetables, whole grains, beans, and nuts. Meats should be lean cut. Avoid canned, processed, and fried foods. °· Drink enough fluids to keep your urine clear or pale yellow. °· Weigh yourself every day. This helps identify if you are retaining fluid that may make your heart and lungs work harder.   °· Rest and limit activity as directed by your health care provider. You may be instructed to: °· Stop any activity at once if you have chest pain, shortness of breath, irregular heartbeats, or dizziness. Get help right away if you have any of these symptoms. °· Move around frequently for short periods or take short walks as directed by your health care provider. Increase your activities gradually. You may need physical therapy or cardiac rehabilitation to help strengthen your muscles and build your endurance. °· Avoid lifting, pushing, or pulling anything heavier than 10 lb (4.5 kg) for at least 6 weeks after surgery. °· Do not drive until your health care provider approves.  °· Ask your health care provider when you may return to work and resume sexual activity. °· Follow up with your health care provider as   directed.   °SEEK MEDICAL CARE IF: °· You have swelling, redness, increasing pain, or drainage at the site of an incision.   °· You develop a fever.   °· You have swelling in your ankles or legs.   °· You have pain in your legs.   °· You have weight gain of 2 or more pounds a day. °· You are nauseous or vomit. °· You have diarrhea.  °SEEK IMMEDIATE MEDICAL CARE  IF: °· You have chest pain that goes to your jaw or arms. °· You have shortness of breath.   °· You have a fast or irregular heartbeat.   °· You notice a "clicking" in your breastbone (sternum) when you move.   °· You have numbness or weakness in your arms or legs. °· You feel dizzy or lightheaded.   °MAKE SURE YOU: °· Understand these instructions. °· Will watch your condition. °· Will get help right away if you are not doing well or get worse. °Document Released: 10/26/2004 Document Revised: 12/09/2012 Document Reviewed: 09/15/2012 °ExitCare® Patient Information ©2014 [employer]. ° °Endoscopic Saphenous Vein Harvesting °Care After °Refer to this sheet in the next few weeks. These instructions provide you with information on caring for yourself after your procedure. Your caregiver may also give you more specific instructions. Your treatment has been planned according to current medical practices, but problems sometimes occur. Call your caregiver if you have any problems or questions after your procedure. °HOME CARE INSTRUCTIONS °Medicine °· Take whatever pain medicine your surgeon prescribes. Follow the directions carefully. Do not take over-the-counter pain medicine unless your surgeon says it is okay. Some pain medicine can cause bleeding problems for several weeks after surgery. °· Follow your surgeon's instructions about driving. You will probably not be permitted to drive after heart surgery. °· Take any medicines your surgeon prescribes. Any medicines you took before your heart surgery should be checked with your caregiver before you start taking them again. °Wound care °· Ask your surgeon how long you should keep wearing your elastic bandage or stocking. °· Check the area around your surgical cuts (incisions) whenever your bandages (dressings) are changed. Look for any redness or swelling. °· You will need to return to have the stitches (sutures) or staples taken out. Ask your surgeon when to do  that. °· Ask your surgeon when you can shower or bathe. °Activity °· Try to keep your legs raised when you are sitting. °· Do any exercises your caregivers have given you. These may include deep breathing exercises, coughing, walking, or other exercises. °SEEK MEDICAL CARE IF: °· You have any questions about your medicines. °· You have more leg pain, especially if your pain medicine stops working. °· New or growing bruises develop on your leg. °· Your leg swells, feels tight, or becomes red. °· You have numbness in your leg. °SEEK IMMEDIATE MEDICAL CARE IF: °· Your pain gets much worse. °· Blood or fluid leaks from any of the incisions. °· Your incisions become warm, swollen, or red. °· You have chest pain. °· You have trouble breathing. °· You have a fever. °· You have more pain near your leg incision. °MAKE SURE YOU: °· Understand these instructions. °· Will watch your condition. °· Will get help right away if you are not doing well or get worse. °Document Released: 12/19/2010 Document Revised: 07/01/2011 Document Reviewed: 12/19/2010 °Exit[employer]® Patient Information ©2014 [employer]. ° ° °

## 2013-08-27 NOTE — Discharge Summary (Signed)
Physician Discharge Summary  Patient ID: Michael Maddox MRN: [id number] DOB/AGE: 1951-04-16 63 y.o.  Admit date: 08/23/2013 Discharge date: 08/27/2013  Admission Diagnoses:  Patient Active Problem List   Diagnosis Date Noted  . CAD (coronary artery disease) 08/16/2013  . Coronary artery disease 08/04/2013  . Peripheral arterial disease 07/02/2013  . Carotid artery disease 07/02/2013  . Essential hypertension 07/02/2013  . Hyperlipidemia 07/02/2013  . Palpitations 07/02/2013   Discharge Diagnoses:  Patient Active Problem List   Diagnosis Date Noted  . S/P CABG x 3 08/23/2013  . CAD (coronary artery disease) 08/16/2013  . Coronary artery disease 08/04/2013  . Peripheral arterial disease 07/02/2013  . Carotid artery disease 07/02/2013  . Essential hypertension 07/02/2013  . Hyperlipidemia 07/02/2013  . Palpitations 07/02/2013   Discharged Condition: good  History of Present Illness:   [name] is a 63 yo African American Male with known history of CAD, PAD, Hypertension and Hyperlipidemia.  He is S/P stent placement to the left common iliac and bilateral SFA.  He also underwent Carotid Endarterectomy after suffering a stroke in 2010.  He presented to his Cardiologist with complaints of palpitations, dizziness, and heaviness in his chest.  Myoview stress test was indicated and showed evidence of inferoseptal ischemia.  Due to this it was felt patient should proceed with cardiac catheterization.  This was performed on 08/16/2013 and revealed 3V CAD.  It was felt he would benefit from Coronary Bypass Grafting procedure and TCTS was consulted.  He was evaluated by Dr. [name] at which time he was felt to be a candidate for CABG.  However, he was taking Plavix and it was felt he should be off this medication for 5-7 days prior to proceeding with surgery.  The risks and benefits of the procedure were explained to the patient and he was agreeable to proceed.       Hospital Course:    Mr. Fick presented to [facility] on 08[facility]23/2013.  He was taken to the operating room and underwent CABG x 3 utilizing LIMA to LAD, SVG to PD, and SVG to OM.  He also underwent Endoscopic saphenous vein harvest from left thigh.  He tolerated the procedure well and was taken to the SICU in stable condition.  The patient was extubated the evening of surgery.  During his stay in the ICU the patient suffered from Bradycardia and required Atrial Pacing.  He was taken off Lopressor and restarted on his home regimen of Bystolic.  The patients chest tubes and arterial lines were removed without difficulty.  He was progressing well and transferred to the telemetry unit in stable condition.  However, the evening of transfer the patient suffered a fall in the middle of the night.  The patient became confused and agitated.  He hit his head which resulted in a laceration above his right eye.  He was transferred back to the SICU and head CT was obtained.  The patient had supposedly taken Ambien that evening for sleep which is believe to have attributed to his fall.  Head CT showed evidence of previous stroke but no acute intracranial process.  The patients confusion resolved by morning and he was oriented to person, place, and time.  He was diuresed for hypervolemia on CXR.  He remained medically stable and was transferred back to the telemetry unit in stable condition.  The patient has continued to progress.  He is maintaining NSR and his pacing wires have been removed.  The patient's preoperative Hemoglobin  A1c was borderline at 6.2.  The patient will require close follow up with his PCP.  He is ambulating without difficulty and tolerating a cardiac diet.  Should no further issues arise we anticipate discharge home in the next 24-48 hours.          Significant Diagnostic Studies: angiography:   HEMODYNAMICS:  AO SYSTOLIC/AO DIASTOLIC: 606/30   ANGIOGRAPHIC RESULTS:  1. Left main; normal  2. LAD; 95%  [place] to mid at a diagonal branch takeoff  3. Left circumflex; 75% segmental proximal OM1.  4. 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 Left ventriculography;not performed today  Treatments: surgery:   Median sternotomy, extracorporeal circulation, coronary artery bypass grafting x3 (left internal mammary artery to left anterior descending, saphenous vein graft to obtuse marginal 1, saphenous vein graft to posterior descending), and endoscopic vein harvest, left thigh.  Disposition: 01-Home or Self Care  Discharge Medications:    Medication List         aspirin 81 MG chewable tablet  Chew 1 tablet (81 mg total) by mouth daily.     clopidogrel 75 MG tablet  Commonly known as:  PLAVIX  Take 75 mg by mouth daily with breakfast.     Flax Seed Oil 1000 MG Caps  Take 2,000 mg by mouth daily.     furosemide 40 MG tablet  Commonly known as:  LASIX  Take 1 tablet (40 mg total) by mouth daily. X 5 days     multivitamin with minerals Tabs tablet  Take 1 tablet by mouth daily.     nebivolol 5 MG tablet  Commonly known as:  BYSTOLIC  Take 5 mg by mouth daily.     niacin-simvastatin 500-20 MG 24 hr tablet  Commonly known as:  SIMCOR  Take 1 tablet by mouth at bedtime.     potassium chloride SA 20 MEQ tablet  Commonly known as:  K-DUR,KLOR-CON  Take 1 tablet (20 mEq total) by mouth 2 (two) times daily. X 5 days     traMADol 50 MG tablet  Commonly known as:  ULTRAM  Take 1-2 tablets (50-100 mg total) by mouth every 6 (six) hours as needed (pain).     vitamin C 500 MG tablet  Commonly known as:  ASCORBIC ACID  Take 500 mg by mouth daily.        The patient has been discharged on:   1.Beta Blocker:  Yes [ x  ]                              No   [   ]                              If No,  reason:  2.Ace Inhibitor/ARB: Yes [   ]                                     No  [ x   ]  If No, reason: Borderline blood pressure  3.Statin:   Yes [ x  ]                  No  [   ]                  If No, reason:  4.Ecasa:  Yes  [ x  ]                  No   [   ]                  If No, reason:    Follow-up Information   Follow up with Melrose Nakayama, MD On 09/28/2013. (Appointment is at 11:00)    Specialty:  Cardiothoracic Surgery   Contact information:   [address] [place] [address] [phone]       Follow up with [facility] On 09/28/2013. (Please get CXR at 10:00)    Contact information:   [facility]       Follow up with Lorretta Harp, MD In 2 weeks. (Office will contact you with an appointment)    Specialty:  Cardiology   Contact information:   [address] [place] [address] [phone]       Signed: Ellwood Handler 08/27/2013, 12:25 PM

## 2013-08-27 NOTE — Progress Notes (Signed)
Nursing note Patient Pacer reduced to AAI at 70 per order will monitor patient. Bettina Gavia Joelee Snoke RN

## 2013-08-28 LAB — GLUCOSE, CAPILLARY
GLUCOSE-CAPILLARY: 127 mg/dL — AB (ref 70–99)
GLUCOSE-CAPILLARY: 123 mg/dL — AB (ref 70–99)
Glucose-Capillary: 118 mg/dL — ABNORMAL HIGH (ref 70–99)

## 2013-08-28 MED ORDER — LACTULOSE 10 GM/15ML PO SOLN
10.0000 g | Freq: Every day | ORAL | Status: DC | PRN
  Administered 2013-08-28: 10 g via ORAL
  Filled 2013-08-28 (×2): qty 15

## 2013-08-28 NOTE — Progress Notes (Signed)
Pt ambulated 400 feet, pt tolerated well Rickard Rhymes, RN

## 2013-08-28 NOTE — Progress Notes (Signed)
CARDIAC REHAB PHASE I   PRE:  Rate/Rhythm: SR 82  BP:  Supine:   Sitting: 100/60  Standing:    SaO2: 100 2lncc  MODE:  Ambulation: 550 ft rolling walker, off O2   POST:  Rate/Rhythm: SR 88  BP:  Supine:   Sitting: 122/70  Standing:    SaO2:  97% RA  Pt oob in chair.  Pt reluctant to ambulate with rehab staff but agreed with encouragement.  Pt ambulated this morning with nursing staff.  Pt o2 sat checked 100% on 2lncc.  02 removed with hopes to wean O2 therapy off.  After 4 minutes on RA pt maintained o2 sat at 97%.  Pt agreed to try ambulation without O2 on.  After 150 feet, o2 level 95% no distress.  Reassessed after additional 200 feet O2 level 97%.  Ambulated additional 200 feet O2 sat 98%.  Pt back to room with no complaints of sob or distress.  Will plan to leave O2 therapy off for now and reassess for maintenance of o2 saturation on RA.  Primary RN notified of plan to leave O2 off and recheck later this afternoon.  Pt in agreement of this and will notify nursing staff if he becomes sob. Pt remarks anticipating discharge Monday or so.  Call bell within reach. [place], BSN

## 2013-08-28 NOTE — Progress Notes (Addendum)
       [address]       [place] [address]             [phone]          5 Days Post-Op Procedure(s) (LRB): CORONARY ARTERY BYPASS GRAFTING (CABG) (N/A) INTRAOPERATIVE TRANSESOPHAGEAL ECHOCARDIOGRAM (N/A)  Subjective: Feels well except no BM. Passing flatus.   Objective: Vital signs in last 24 hours: Patient Vitals for the past 24 hrs:  BP Temp Temp src Pulse Resp SpO2 Weight  08/28/13 0823 89/61 mmHg - - 70 - - -  08/28/13 0646 90/57 mmHg 98.5 F (36.9 C) Oral 70 18 96 % 158 lb 1.1 oz (71.7 kg)  08/27/13 1955 115/66 mmHg 98.9 F (37.2 C) Oral 70 18 96 % -  08/27/13 1351 109/64 mmHg 99.1 F (37.3 C) Oral 90 16 93 % -  08/27/13 1056 116/70 mmHg 98.7 F (37.1 C) Oral 90 18 96 % -  08/27/13 1000 105/64 mmHg - - 89 23 97 % -  08/27/13 0900 130/64 mmHg - - 94 19 95 % -   Current Weight  08/28/13 158 lb 1.1 oz (71.7 kg)  PRE-OPERATIVE WEIGHT: 70kg    Intake/Output from previous day: 05/08 0701 - 05/09 0700 In: 480 [P.O.:480] Out: 1475 [Urine:1475]      PHYSICAL EXAM:  Heart: RRR, 70s Lungs: Clear Wound: Clean and dry Extremities: No significant LE edema    Lab Results: CBC: Recent Labs  08/26/13 0640 08/27/13 0452  WBC 9.4 8.5  HGB 9.8* 9.4*  HCT 29.8* 28.8*  PLT 157 188   BMET:  Recent Labs  08/26/13 0640 08/27/13 0452  NA 137 141  K 4.3 4.5  CL 98 103  CO2 25 27  GLUCOSE 154* 97  BUN 18 16  CREATININE 0.94 0.99  CALCIUM 8.3* 8.6    PT/INR: No results found for this basename: LABPROT, INR,  in the last 72 hours    Assessment/Plan: S/P Procedure(s) (LRB): CORONARY ARTERY BYPASS GRAFTING (CABG) (N/A) INTRAOPERATIVE TRANSESOPHAGEAL ECHOCARDIOGRAM (N/A)  CV- Occasionally pacing at 70.  Will turn pacer down to 60 and watch HR.  SBPs a little low this am.  Will watch.  Pulm- remains on 2L.  Will wean and d/c as tolerated. Continue pulm toilet/IS.  Mental status back to baseline this am. Off narcotics.  GI-  LOC today. .   LOS: 5 days    Coolidge Breeze 08/28/2013   Chart reviewed, patient examined, agree with above. Had BM this afternoon and feels better. He can go home when off oxygen.

## 2013-08-29 MED ORDER — TRAMADOL HCL 50 MG PO TABS: 50.0000 mg | ORAL_TABLET | Freq: Four times a day (QID) | ORAL | Status: DC | PRN

## 2013-08-29 MED ORDER — POTASSIUM CHLORIDE CRYS ER 20 MEQ PO TBCR: 20.0000 meq | EXTENDED_RELEASE_TABLET | Freq: Two times a day (BID) | ORAL | Status: DC

## 2013-08-29 MED ORDER — NEBIVOLOL HCL 2.5 MG PO TABS
2.5000 mg | ORAL_TABLET | Freq: Every day | ORAL | Status: DC
  Filled 2013-08-29: qty 1

## 2013-08-29 MED ORDER — NEBIVOLOL HCL 2.5 MG PO TABS: 2.5000 mg | ORAL_TABLET | Freq: Every day | ORAL | Status: DC

## 2013-08-29 MED ORDER — FUROSEMIDE 40 MG PO TABS: 40.0000 mg | ORAL_TABLET | Freq: Every day | ORAL | Status: DC

## 2013-08-29 NOTE — Progress Notes (Signed)
       [address]       [place] [address]             [phone]          6 Days Post-Op Procedure(s) (LRB): CORONARY ARTERY BYPASS GRAFTING (CABG) (N/A) INTRAOPERATIVE TRANSESOPHAGEAL ECHOCARDIOGRAM (N/A)  Subjective: Feels well, +BM.  No complaints.  Off oxygen.  Objective: Vital signs in last 24 hours: Patient Vitals for the past 24 hrs:  BP Temp Temp src Pulse Resp SpO2  08/28/13 1622 - - - - - 94 %  08/28/13 1507 91/56 mmHg 98.9 F (37.2 C) Oral 77 18 97 %  08/28/13 1319 - - - - - 95 %  08/28/13 0823 89/61 mmHg - - 70 - -   Current Weight  08/28/13 158 lb 1.1 oz (71.7 kg)  PRE-OPERATIVE WEIGHT: 70kg    Intake/Output from previous day: 05/09 0701 - 05/10 0700 In: 480 [P.O.:480] Out: 600 [Urine:600]    PHYSICAL EXAM:  Heart: RRR Lungs: Clear Wound: Clean and dry Extremities: No significant LE edema    Lab Results: CBC: Recent Labs  08/27/13 0452  WBC 8.5  HGB 9.4*  HCT 28.8*  PLT 188   BMET:  Recent Labs  08/27/13 0452  NA 141  K 4.5  CL 103  CO2 27  GLUCOSE 97  BUN 16  CREATININE 0.99  CALCIUM 8.6    PT/INR: No results found for this basename: LABPROT, INR,  in the last 72 hours    Assessment/Plan: S/P Procedure(s) (LRB): CORONARY ARTERY BYPASS GRAFTING (CABG) (N/A) INTRAOPERATIVE TRANSESOPHAGEAL ECHOCARDIOGRAM (N/A) CV- SBPs remain low- will decrease Bystolic and watch. Pulm -Pt off O2 and doing well.   Plan discharge home today with family- instructions reviewed with patient.   LOS: 6 days    Coolidge Breeze 08/29/2013

## 2013-08-29 NOTE — Progress Notes (Signed)
dc'ed pacing wires per unit protocol

## 2013-08-29 NOTE — Progress Notes (Signed)
Discharged to home with family office visits in place teaching done  

## 2013-08-30 NOTE — Progress Notes (Signed)
08/30/2013 1045 No NCM needs identified. Jonnie Finner RN CCM Case Mgmt phone [phone]

## 2013-09-27 ENCOUNTER — Encounter: Payer: Self-pay | Admitting: Cardiology

## 2013-09-27 ENCOUNTER — Other Ambulatory Visit: Payer: Self-pay | Admitting: Thoracic Surgery (Cardiothoracic Vascular Surgery)

## 2013-09-27 ENCOUNTER — Ambulatory Visit (INDEPENDENT_AMBULATORY_CARE_PROVIDER_SITE_OTHER): Payer: BC Managed Care – PPO | Admitting: Cardiology

## 2013-09-27 VITALS — BP 132/54 | HR 61 | Ht 72.0 in | Wt 156.2 lb

## 2013-09-27 DIAGNOSIS — I2510 Atherosclerotic heart disease of native coronary artery without angina pectoris: Secondary | ICD-10-CM

## 2013-09-27 DIAGNOSIS — I739 Peripheral vascular disease, unspecified: Secondary | ICD-10-CM

## 2013-09-27 DIAGNOSIS — Z951 Presence of aortocoronary bypass graft: Secondary | ICD-10-CM

## 2013-09-27 NOTE — Patient Instructions (Signed)
Your physician recommends that you schedule a follow-up appointment With Dr [name] first available   Your physician has recommended you make the following change in your medication:  Continue taking your medication no changes.

## 2013-09-28 ENCOUNTER — Encounter: Payer: Self-pay | Admitting: Thoracic Surgery (Cardiothoracic Vascular Surgery)

## 2013-09-28 ENCOUNTER — Ambulatory Visit
Admission: RE | Admit: 2013-09-28 | Discharge: 2013-09-28 | Disposition: A | Discharge status: Home / Self Care | Payer: BC Managed Care – PPO | Source: Ambulatory Visit | Attending: Thoracic Surgery (Cardiothoracic Vascular Surgery) | Admitting: Thoracic Surgery (Cardiothoracic Vascular Surgery)

## 2013-09-28 ENCOUNTER — Other Ambulatory Visit: Payer: Self-pay | Admitting: *Deleted

## 2013-09-28 ENCOUNTER — Ambulatory Visit (INDEPENDENT_AMBULATORY_CARE_PROVIDER_SITE_OTHER): Payer: Self-pay | Admitting: Thoracic Surgery (Cardiothoracic Vascular Surgery)

## 2013-09-28 VITALS — BP 100/62 | HR 61 | Resp 16 | Ht 71.0 in | Wt 152.0 lb

## 2013-09-28 DIAGNOSIS — I739 Peripheral vascular disease, unspecified: Secondary | ICD-10-CM

## 2013-09-28 DIAGNOSIS — G8918 Other acute postprocedural pain: Secondary | ICD-10-CM

## 2013-09-28 DIAGNOSIS — Z951 Presence of aortocoronary bypass graft: Secondary | ICD-10-CM

## 2013-09-28 DIAGNOSIS — I2510 Atherosclerotic heart disease of native coronary artery without angina pectoris: Secondary | ICD-10-CM

## 2013-09-28 MED ORDER — TRAMADOL HCL 50 MG PO TABS: 50.0000 mg | ORAL_TABLET | Freq: Four times a day (QID) | ORAL | Status: DC | PRN

## 2013-09-28 NOTE — Progress Notes (Signed)
HPI:  Mr. Michael Maddox returns for a scheduled postoperative followup visit.  He is a 63 year old gentleman who had coronary bypass grafting x3 on 08/23/2013. His postoperative course was uncomplicated and he was discharged home on postoperative day #6.  Since discharge he says his been doing well. He says that he is taking 2 pain pills before he goes to bed at night, but otherwise does not need them. He denies any anginal-type chest pain or shortness of breath. He denies swelling in his legs.  Past Medical History  Diagnosis Date  . Stroke 2010  . HTN (hypertension)   . Hyperlipemia   . History of stress test 02/23/2003    A nagative cardiolite stress test  . Palpitations   . Peripheral arterial disease   . Carotid artery disease   . Coronary artery disease     history of 3 vessel CAD at cardiac catheterization 12/01/00  . Shortness of breath   . GERD (gastroesophageal reflux disease)   . 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 Arterial insufficiency     SEVERE   ILLIAC & SFA      Current Outpatient Prescriptions  Medication Sig Dispense Refill  . aspirin 81 MG chewable tablet Chew 1 tablet (81 mg total) by mouth daily.      . clopidogrel (PLAVIX) 75 MG tablet Take 75 mg by mouth daily with breakfast.       . Flaxseed, Linseed, (FLAX SEED OIL) 1000 MG CAPS Take 2,000 mg by mouth daily.       . metoprolol succinate (TOPROL-XL) 25 MG 24 hr tablet Take 1 tablet by mouth daily.      . Multiple Vitamin (MULTIVITAMIN WITH MINERALS) TABS tablet Take 1 tablet by mouth daily.      . simvastatin (ZOCOR) 20 MG tablet Take 1 tablet by mouth at bedtime.      . vitamin C (ASCORBIC ACID) 500 MG tablet Take 500 mg by mouth daily.      . traMADol (ULTRAM) 50 MG tablet Take 1-2 tablets (50-100 mg total) by mouth every 6 (six) hours as needed (pain).  30 tablet  0   No current facility-administered medications for this visit.    Physical Exam BP 100/62  Pulse 61  Resp 16  Ht 5\' 11"  (1.803 m)  Wt 152 lb  (68.947 kg)  BMI 21.21 kg/m2  SpO28 70% 63 year old gentleman in no acute distress Well-developed well-nourished Neurologically intact Sternum stable, incision clean dry and intact Cardiac regular rate and rhythm normal S1 and S2 Lungs clear with equal breath sounds bilaterally No peripheral edema, left leg incision healing well  Diagnostic Tests: Chest x-ray 09/28/2013 shows no effusions or infiltrates  Impression: Michael Maddox is a 63 year old gentleman who is now a little over a month post coronary bypass grafting x3. He is doing extremely well at this time. He has minimal discomfort. His exercise tolerance is good and continues to improve. He is only taking at pain pills at night before he goes to bed.  He may begin driving. He was cautioned not to drive after taking the pain medication. Appropriate precautions for driving were also discussed. He is not to lift anything over 10 pounds for another 2 weeks. After that his activities are unrestricted.  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 I told him that he can start back to work with half days for the next week and then if he tolerates  that, he can return to full-time whenever he wishes.  Plan: He will continue to be followed by Dr. Quay Burow.  I will be happy to see him back any time if I can be of any further assistance with his care.

## 2013-10-04 NOTE — Progress Notes (Signed)
Patient ID: Michael Maddox, male   DOB: 08/29/1950, 63 y.o.   MRN: [id number]    09/27/2013 Michael Maddox   Aug 12, 1950  [id number]  Primary Physicia Michael Fendt, MD Primary Cardiologist: Dr. Gwenlyn Maddox  HPI:   Patient is a 63 year old African American male, followed by Dr. Gwenlyn Maddox. His history is significant for CAD, hypertension, hyperlipidemia, PVD and remote tobacco abuse, having quit in 2005. He is status post left iliac and bilateral SFA intervention in 2005 by Dr. Gwenlyn Maddox. He had a right carotid endarterectomy performed by Dr. Joseph Maddox in November 2010 for high-grade disease in the setting of a stroke. He was recently seen by Dr. Gwenlyn Maddox in clinic and complained of bilateral lower extremity claudication. Doppler studies revealed severe arterial insufficiency with a right ABI of 0.25 and a left ABI of 0.39. It also suggested severe iliac and SFA disease. Dr. Gwenlyn Maddox recommended repeat PV angiogram with potential intervention. Prior to undergoing the procedure, he underwent a Myoview stress test that showed inferior septal ischemia, thought to be "high risk". Subsequently, Dr. Gwenlyn Maddox perform both a left heart catheterization and peripheral angiogram on 08/16/2013. 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 He was also Maddox to have severe iliac disease and SFA occlusion bilaterally. Coronary artery bypass grafting was recommended for CAD and it was decided to delay PV intervention until after his cardiac revascularization. CT surgery was consult and he was evaluated by Dr. Roxan Maddox who agreed that he would benefit from surgical revascularization. However, due to the fact that he was on Plavix, he was discharged home for washout. He returned to [facility] on 08/23/2013 and underwent coronary artery bypass grafting x3, performed by Dr. Roxan Maddox. He received a free LIMA to the LAD, SVG to OM 1 and SVG to the PDA. His postoperative course was uncomplicated and he  was discharged home.  He returns to clinic today for posthospital followup. From a cardiac standpoint he feels that he has done well since discharge. He denies any anginal pain and no shortness of breath. From a peripheral vascular standpoint, he continues to endorse significant lifestyle limiting claudication bilaterally. He has no other complaints. He states that he has been fully compliant with his medications.   Current Outpatient Prescriptions  Medication Sig Dispense Refill  . aspirin 81 MG chewable tablet Chew 1 tablet (81 mg total) by mouth daily.      . clopidogrel (PLAVIX) 75 MG tablet Take 75 mg by mouth daily with breakfast.       . Flaxseed, Linseed, (FLAX SEED OIL) 1000 MG CAPS Take 2,000 mg by mouth daily.       . metoprolol succinate (TOPROL-XL) 25 MG 24 hr tablet Take 1 tablet by mouth daily.      . Multiple Vitamin (MULTIVITAMIN WITH MINERALS) TABS tablet Take 1 tablet by mouth daily.      . simvastatin (ZOCOR) 20 MG tablet Take 1 tablet by mouth at bedtime.      . traMADol (ULTRAM) 50 MG tablet Take 1-2 tablets (50-100 mg total) by mouth every 6 (six) hours as needed (pain).  30 tablet  0  . vitamin C (ASCORBIC ACID) 500 MG tablet Take 500 mg by mouth daily.       No current facility-administered medications for this visit.    No Known Allergies  History   Social History  . Marital Status: Married    Spouse Name: N/A    Number of Children: N/A  .  Years of Education: N/A   Occupational History  . Not on file.   Social History Main Topics  . Smoking status: Former Smoker    Types: Cigarettes    Quit date: 05/04/2013  . Smokeless tobacco: Never Used     Comment: ' i SMOKE ONCE IN AWHILE "  . Alcohol Use: 2.5 oz/week    5 drink(s) per week  . Drug Use: No  . Sexual Activity: Not on file   Other Topics Concern  . Not on file   Social History Narrative  . No narrative on file     Review of Systems: General: negative for chills, fever, night sweats or  weight changes.  Cardiovascular: negative for chest pain, dyspnea on exertion, edema, orthopnea, palpitations, paroxysmal nocturnal dyspnea or shortness of breath Dermatological: negative for rash Respiratory: negative for cough or wheezing Urologic: negative for hematuria Abdominal: negative for nausea, vomiting, diarrhea, bright red blood per rectum, melena, or hematemesis Neurologic: negative for visual changes, syncope, or dizziness All other systems reviewed and are otherwise negative except as noted above.    Blood pressure 132/54, pulse 61, height 6' (1.829 m), weight 156 lb 3.2 oz (70.852 kg).  General appearance: alert, cooperative and no distress Neck: no JVD Lungs: clear to auscultation bilaterally Heart: regular rate and rhythm, S1, S2 normal, no murmur, click, rub or gallop Extremities: no LEE Pulses: decreased DPs Skin: warm and dry Neurologic: Grossly normal  EKG NSR; HR 61 bpm  ASSESSMENT AND PLAN:   1. CAD: Stable. Status post coronary artery bypass grafting x3 (LIMA-LAD, SVG-OM 1, SVG-PDA). He denies any anginal pain. No dyspnea. Continue medical therapy: Aspirin, Plavix, metoprolol, simvastatin.  2. PVD: Recent PV angiogram revealed bilateral PVD (findings outlined below).?Aorto bifemoral bypass grafting. Followup with Dr. Gwenlyn Maddox to rediscuss definite plan.  Left lower extremity-60% proximal segmental left common iliac artery stenosis. The previously placed left external iliac artery stent was patent. The left external iliac artery and common femoral artery were diffusely diseased with a focal 80-90% stenosis. The SFA was occluded at its origin with reconstitution in the adductor canal by profunda femoris collaterals. There was three-vessel runoff.    Right lower extremity-the right common and external iliac artery were occluded. The right common femoral artery filled by collaterals as did the profunda femoris. The right SFA was occluded at its origin with  reconstitution in the adductor canal. There was three-vessel runoff.  3. Hypertension: Well controlled. Continue current plan of care.  4. Hyperlipidemia: Continue statin therapy.  PLAN   Mr. Franek appears to be doing well from a cardiac standpoint after undergoing CABG x3. However, from a peripheral vascular standpoint, he continues to have significant bilateral, lifestyle limiting, lower extremity claudication. We know after his recent PV angiogram that he has severe bilateral disease. He will need to followup with Dr. Gwenlyn Maddox once he fully recovers from his recent surgery to discuss definite plans for revascularization.  Arvilla Market 09/27/2013 7:57 PM

## 2013-10-21 NOTE — Telephone Encounter (Signed)
Encounter complete. 

## 2013-11-03 NOTE — Telephone Encounter (Signed)
Encounter complete. 

## 2013-11-11 ENCOUNTER — Ambulatory Visit (HOSPITAL_COMMUNITY): Payer: BC Managed Care – PPO

## 2013-11-15 ENCOUNTER — Ambulatory Visit (HOSPITAL_COMMUNITY): Payer: BC Managed Care – PPO

## 2013-11-17 ENCOUNTER — Ambulatory Visit (HOSPITAL_COMMUNITY): Payer: BC Managed Care – PPO

## 2013-11-19 ENCOUNTER — Ambulatory Visit (HOSPITAL_COMMUNITY): Payer: BC Managed Care – PPO

## 2013-11-22 ENCOUNTER — Ambulatory Visit (HOSPITAL_COMMUNITY): Payer: BC Managed Care – PPO

## 2013-11-24 ENCOUNTER — Ambulatory Visit (HOSPITAL_COMMUNITY): Payer: BC Managed Care – PPO

## 2013-11-26 ENCOUNTER — Ambulatory Visit (HOSPITAL_COMMUNITY): Payer: BC Managed Care – PPO

## 2013-11-29 ENCOUNTER — Ambulatory Visit (HOSPITAL_COMMUNITY): Payer: BC Managed Care – PPO

## 2013-12-01 ENCOUNTER — Ambulatory Visit (HOSPITAL_COMMUNITY): Payer: BC Managed Care – PPO

## 2013-12-03 ENCOUNTER — Ambulatory Visit (HOSPITAL_COMMUNITY): Payer: BC Managed Care – PPO

## 2013-12-06 ENCOUNTER — Ambulatory Visit (HOSPITAL_COMMUNITY): Payer: BC Managed Care – PPO

## 2013-12-08 ENCOUNTER — Ambulatory Visit (HOSPITAL_COMMUNITY): Payer: BC Managed Care – PPO

## 2013-12-09 ENCOUNTER — Telehealth (HOSPITAL_COMMUNITY): Payer: Self-pay | Admitting: *Deleted

## 2013-12-09 NOTE — Telephone Encounter (Signed)
Message left to please contact to reschedule cardiac rehab.  Contact information provided.

## 2013-12-10 ENCOUNTER — Ambulatory Visit (HOSPITAL_COMMUNITY): Payer: BC Managed Care – PPO

## 2013-12-13 ENCOUNTER — Ambulatory Visit (HOSPITAL_COMMUNITY): Payer: BC Managed Care – PPO

## 2013-12-15 ENCOUNTER — Ambulatory Visit (HOSPITAL_COMMUNITY): Payer: BC Managed Care – PPO

## 2013-12-17 ENCOUNTER — Ambulatory Visit (HOSPITAL_COMMUNITY): Payer: BC Managed Care – PPO

## 2013-12-20 ENCOUNTER — Ambulatory Visit (HOSPITAL_COMMUNITY): Payer: BC Managed Care – PPO

## 2013-12-22 ENCOUNTER — Ambulatory Visit (HOSPITAL_COMMUNITY): Payer: BC Managed Care – PPO

## 2013-12-24 ENCOUNTER — Ambulatory Visit (HOSPITAL_COMMUNITY): Payer: BC Managed Care – PPO

## 2013-12-29 ENCOUNTER — Ambulatory Visit (HOSPITAL_COMMUNITY): Payer: BC Managed Care – PPO

## 2013-12-31 ENCOUNTER — Ambulatory Visit (HOSPITAL_COMMUNITY): Payer: BC Managed Care – PPO

## 2014-01-03 ENCOUNTER — Ambulatory Visit (HOSPITAL_COMMUNITY): Payer: BC Managed Care – PPO

## 2014-01-05 ENCOUNTER — Ambulatory Visit (HOSPITAL_COMMUNITY): Payer: BC Managed Care – PPO

## 2014-01-07 ENCOUNTER — Ambulatory Visit (HOSPITAL_COMMUNITY): Payer: BC Managed Care – PPO

## 2014-01-10 ENCOUNTER — Ambulatory Visit (HOSPITAL_COMMUNITY): Payer: BC Managed Care – PPO

## 2014-01-12 ENCOUNTER — Ambulatory Visit (HOSPITAL_COMMUNITY): Payer: BC Managed Care – PPO

## 2014-01-14 ENCOUNTER — Ambulatory Visit (HOSPITAL_COMMUNITY): Payer: BC Managed Care – PPO

## 2014-01-17 ENCOUNTER — Ambulatory Visit (HOSPITAL_COMMUNITY): Payer: BC Managed Care – PPO

## 2014-01-19 ENCOUNTER — Ambulatory Visit (HOSPITAL_COMMUNITY): Payer: BC Managed Care – PPO

## 2014-01-21 ENCOUNTER — Ambulatory Visit (HOSPITAL_COMMUNITY): Payer: BC Managed Care – PPO

## 2014-01-24 ENCOUNTER — Ambulatory Visit (HOSPITAL_COMMUNITY): Payer: BC Managed Care – PPO

## 2014-01-26 ENCOUNTER — Ambulatory Visit (HOSPITAL_COMMUNITY): Payer: BC Managed Care – PPO

## 2014-01-28 ENCOUNTER — Ambulatory Visit (HOSPITAL_COMMUNITY): Payer: BC Managed Care – PPO

## 2014-01-31 ENCOUNTER — Ambulatory Visit (HOSPITAL_COMMUNITY): Payer: BC Managed Care – PPO

## 2014-02-02 ENCOUNTER — Ambulatory Visit (HOSPITAL_COMMUNITY): Payer: BC Managed Care – PPO

## 2014-02-04 ENCOUNTER — Ambulatory Visit (HOSPITAL_COMMUNITY): Payer: BC Managed Care – PPO

## 2014-02-07 ENCOUNTER — Ambulatory Visit (HOSPITAL_COMMUNITY): Payer: BC Managed Care – PPO

## 2014-02-09 ENCOUNTER — Ambulatory Visit (HOSPITAL_COMMUNITY): Payer: BC Managed Care – PPO

## 2014-02-11 ENCOUNTER — Ambulatory Visit (HOSPITAL_COMMUNITY): Payer: BC Managed Care – PPO

## 2014-02-14 ENCOUNTER — Ambulatory Visit (HOSPITAL_COMMUNITY): Payer: BC Managed Care – PPO

## 2014-02-16 ENCOUNTER — Ambulatory Visit (HOSPITAL_COMMUNITY): Payer: BC Managed Care – PPO

## 2014-02-18 ENCOUNTER — Ambulatory Visit (HOSPITAL_COMMUNITY): Payer: BC Managed Care – PPO

## 2014-02-21 ENCOUNTER — Ambulatory Visit (HOSPITAL_COMMUNITY): Payer: BC Managed Care – PPO

## 2014-03-31 ENCOUNTER — Encounter (HOSPITAL_COMMUNITY): Payer: Self-pay | Admitting: Cardiovascular Disease

## 2014-06-16 ENCOUNTER — Telehealth (HOSPITAL_COMMUNITY): Payer: Self-pay | Admitting: *Deleted

## 2016-03-20 DIAGNOSIS — Z23 Encounter for immunization: Secondary | ICD-10-CM | POA: Diagnosis not present

## 2016-03-20 DIAGNOSIS — I10 Essential (primary) hypertension: Secondary | ICD-10-CM | POA: Diagnosis not present

## 2016-03-20 DIAGNOSIS — R972 Elevated prostate specific antigen [PSA]: Secondary | ICD-10-CM | POA: Diagnosis not present

## 2016-03-20 DIAGNOSIS — I2510 Atherosclerotic heart disease of native coronary artery without angina pectoris: Secondary | ICD-10-CM | POA: Diagnosis not present

## 2016-03-20 DIAGNOSIS — R7301 Impaired fasting glucose: Secondary | ICD-10-CM | POA: Diagnosis not present

## 2016-03-20 DIAGNOSIS — E784 Other hyperlipidemia: Secondary | ICD-10-CM | POA: Diagnosis not present

## 2016-09-18 DIAGNOSIS — R7301 Impaired fasting glucose: Secondary | ICD-10-CM | POA: Diagnosis not present

## 2016-09-18 DIAGNOSIS — I10 Essential (primary) hypertension: Secondary | ICD-10-CM | POA: Diagnosis not present

## 2016-09-18 DIAGNOSIS — I6789 Other cerebrovascular disease: Secondary | ICD-10-CM | POA: Diagnosis not present

## 2016-09-18 DIAGNOSIS — I2510 Atherosclerotic heart disease of native coronary artery without angina pectoris: Secondary | ICD-10-CM | POA: Diagnosis not present

## 2016-10-17 DIAGNOSIS — R972 Elevated prostate specific antigen [PSA]: Secondary | ICD-10-CM | POA: Diagnosis not present

## 2016-10-24 DIAGNOSIS — R350 Frequency of micturition: Secondary | ICD-10-CM | POA: Diagnosis not present

## 2016-10-24 DIAGNOSIS — R972 Elevated prostate specific antigen [PSA]: Secondary | ICD-10-CM | POA: Diagnosis not present

## 2016-10-24 DIAGNOSIS — N401 Enlarged prostate with lower urinary tract symptoms: Secondary | ICD-10-CM | POA: Diagnosis not present

## 2016-11-20 DIAGNOSIS — R972 Elevated prostate specific antigen [PSA]: Secondary | ICD-10-CM | POA: Diagnosis not present

## 2016-12-18 DIAGNOSIS — N5201 Erectile dysfunction due to arterial insufficiency: Secondary | ICD-10-CM | POA: Diagnosis not present

## 2016-12-18 DIAGNOSIS — C61 Malignant neoplasm of prostate: Secondary | ICD-10-CM | POA: Diagnosis not present

## 2016-12-26 DIAGNOSIS — I2510 Atherosclerotic heart disease of native coronary artery without angina pectoris: Secondary | ICD-10-CM | POA: Diagnosis not present

## 2016-12-26 DIAGNOSIS — R7301 Impaired fasting glucose: Secondary | ICD-10-CM | POA: Diagnosis not present

## 2016-12-26 DIAGNOSIS — C61 Malignant neoplasm of prostate: Secondary | ICD-10-CM | POA: Diagnosis not present

## 2016-12-26 DIAGNOSIS — I10 Essential (primary) hypertension: Secondary | ICD-10-CM | POA: Diagnosis not present

## 2016-12-30 ENCOUNTER — Encounter: Payer: Self-pay | Admitting: Radiation Oncology

## 2017-01-07 DIAGNOSIS — C61 Malignant neoplasm of prostate: Secondary | ICD-10-CM | POA: Diagnosis not present

## 2017-01-14 ENCOUNTER — Encounter: Payer: Self-pay | Admitting: Radiation Oncology

## 2017-01-14 NOTE — Progress Notes (Signed)
GU Location of Tumor / Histology: prostatic adenocarcinoma  If Prostate Cancer, Gleason Score is (4 + 3) and PSA is (5.73). Prostate volume: 36cc  Michael Maddox is an established patient of Dr. Karsten Ro who was referred back by his PCP, dr. Nolene Ebbs for evaluation of an elevated PSA.   07/2010  PSA  1.25 11/2011  PSA 1.91 08/2012  PSA  2.12 02/2015 PSA 4.29  Biopsies of prostate (if applicable) revealed:    Past/Anticipated interventions by urology, if any: prostate biopsy  Past/Anticipated interventions by medical oncology, if any: no  Weight changes, if TXM:IWOE  Bowel/Bladder complaints, if any: States that he uses the restroom once before he goes to bed and does not use there restroom  during the night. Denies any burning or bleeding with urination. States that sometime he does have urgency.States that his stream is stop and go with a moderate to weak flow.States that he is constipated alot. States that he uses suppository with some relief.  Nausea/Vomiting, if any: no  Pain issues, if any: None  SAFETY ISSUES:  Prior radiation? No  Pacemaker/ICD? No Had a triple bypass heart  surgery three years ago.  Possible current pregnancy? no  Is the patient on methotrexate? No  Current Complaints / other details:  67 year old male. Married. Most interested in radioactive seed implant.  Vitals:   01/15/17 1338  BP: 123/62  Pulse: 60  Resp: 20  Temp: 97.8 F (36.6 C)  TempSrc: Oral  SpO2: 100%  Weight: 152 lb 2 oz (69 kg)   Wt Readings from Last 3 Encounters:  01/15/17 152 lb 2 oz (69 kg)  09/28/13 152 lb (68.9 kg)  09/27/13 156 lb 3.2 oz (70.9 kg)

## 2017-01-15 ENCOUNTER — Ambulatory Visit
Admission: RE | Admit: 2017-01-15 | Discharge: 2017-01-15 | Disposition: A | Discharge status: Home / Self Care | Payer: Medicare Other | Source: Ambulatory Visit | Attending: Radiation Oncology | Admitting: Radiation Oncology

## 2017-01-15 ENCOUNTER — Telehealth: Payer: Self-pay | Admitting: *Deleted

## 2017-01-15 ENCOUNTER — Encounter: Payer: Self-pay | Admitting: Radiation Oncology

## 2017-01-15 VITALS — BP 123/62 | HR 60 | Temp 97.8°F | Resp 20 | Wt 152.1 lb

## 2017-01-15 DIAGNOSIS — Z8249 Family history of ischemic heart disease and other diseases of the circulatory system: Secondary | ICD-10-CM | POA: Insufficient documentation

## 2017-01-15 DIAGNOSIS — I771 Stricture of artery: Secondary | ICD-10-CM | POA: Insufficient documentation

## 2017-01-15 DIAGNOSIS — Z8673 Personal history of transient ischemic attack (TIA), and cerebral infarction without residual deficits: Secondary | ICD-10-CM | POA: Insufficient documentation

## 2017-01-15 DIAGNOSIS — Z9889 Other specified postprocedural states: Secondary | ICD-10-CM | POA: Diagnosis not present

## 2017-01-15 DIAGNOSIS — M19071 Primary osteoarthritis, right ankle and foot: Secondary | ICD-10-CM | POA: Diagnosis not present

## 2017-01-15 DIAGNOSIS — Z79899 Other long term (current) drug therapy: Secondary | ICD-10-CM | POA: Diagnosis not present

## 2017-01-15 DIAGNOSIS — Z833 Family history of diabetes mellitus: Secondary | ICD-10-CM | POA: Insufficient documentation

## 2017-01-15 DIAGNOSIS — C61 Malignant neoplasm of prostate: Secondary | ICD-10-CM | POA: Diagnosis not present

## 2017-01-15 DIAGNOSIS — Z87891 Personal history of nicotine dependence: Secondary | ICD-10-CM | POA: Insufficient documentation

## 2017-01-15 DIAGNOSIS — K219 Gastro-esophageal reflux disease without esophagitis: Secondary | ICD-10-CM | POA: Diagnosis not present

## 2017-01-15 DIAGNOSIS — Z7902 Long term (current) use of antithrombotics/antiplatelets: Secondary | ICD-10-CM | POA: Insufficient documentation

## 2017-01-15 DIAGNOSIS — M19072 Primary osteoarthritis, left ankle and foot: Secondary | ICD-10-CM | POA: Insufficient documentation

## 2017-01-15 DIAGNOSIS — Z7982 Long term (current) use of aspirin: Secondary | ICD-10-CM | POA: Diagnosis not present

## 2017-01-15 DIAGNOSIS — I2510 Atherosclerotic heart disease of native coronary artery without angina pectoris: Secondary | ICD-10-CM | POA: Diagnosis not present

## 2017-01-15 DIAGNOSIS — I10 Essential (primary) hypertension: Secondary | ICD-10-CM | POA: Diagnosis not present

## 2017-01-15 DIAGNOSIS — N401 Enlarged prostate with lower urinary tract symptoms: Secondary | ICD-10-CM | POA: Diagnosis not present

## 2017-01-15 DIAGNOSIS — R972 Elevated prostate specific antigen [PSA]: Secondary | ICD-10-CM | POA: Diagnosis not present

## 2017-01-15 HISTORY — DX: Malignant neoplasm of prostate: C61

## 2017-01-15 NOTE — Progress Notes (Signed)
Radiation Oncology         [phone] ________________________________  Initial Outpatient Consultation  Name: Michael Maddox MRN: [id number]  Date: 01/15/2017  DOB: 07/11/1950  TG:GYIRSWN, Christean Grief, MD  Kathie Rhodes, MD   REFERRING PHYSICIAN: Kathie Rhodes, MD  DIAGNOSIS: 66 y.o. gentleman with Stage T1c adenocarcinoma of the prostate with Gleason Score of 4+3, and PSA of 5.73    ICD-10-CM   1. Adenocarcinoma of prostate ([place]) C61 MR PELVIS LTD WITHOUT CONTRAST FOR PROSTATE RTP    HISTORY OF PRESENT ILLNESS: Michael Maddox is a 66 y.o. male with a diagnosis of prostate cancer. He was noted to have an elevated PSA of 5.73 by his primary care physician, Dr. Nolene Ebbs, on 10/17/2016.  Accordingly, he was referred for evaluation in urology by Dr. Kathie Rhodes on 10/24/2016, a digital rectal examination at that time revealing no nodularity. He is an established patient of Dr. Karsten Ro with a two-year history of elevated PSA and benign prostatic hypertrophy with lower urinary tract symptoms, managed with Alfuzosin.   The patient proceeded to transrectal ultrasound with 12 biopsies of the prostate on 11/20/2016. The prostate volume measured 35.95 cc.  Out of 12 core biopsies, 4 were positive.  The maximum Gleason score was 4+3, and this was seen in the right apex, right base lateral, and right apex lateral. 3+4 was noted in the right mid lateral.  Biopsies of prostate (if applicable) revealed:    PSA History: 10/17/2016 PSA 5.73 03/20/2016 PSA 4.1 11/16/2015 PSA 4.67 08/24/2015 PSA 5.10 05/03/2015 PSA 4.59  The patient reviewed the biopsy results with his urologist and he has kindly been referred today for discussion of potential radiation treatment options. He is most interested in prostate brachytherapy.  Of note, the PMH is significant for a TIA in 2010 that required endarterectomy, CAD resulting in a CABG in 2015 and PVD s/p multiple revascularization procedures and stenting in  the LEs.  He is on chronic anticoagulation with Plavix and Aspirin. His cardiologist is Dr. [name].  PREVIOUS RADIATION THERAPY: No  PAST MEDICAL HISTORY:  Past Medical History:  Diagnosis Date  . Arterial insufficiency (HCC)    SEVERE   ILLIAC & SFA  . 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 Carotid artery disease ([place])   . Coronary artery disease    history of 3 vessel CAD at cardiac catheterization 12/01/00  . GERD (gastroesophageal reflux disease)   . History of stress test 02/23/2003   A nagative cardiolite stress test  . HTN (hypertension)   . Hyperlipemia   . Palpitations   . Peripheral arterial disease ([place])   . Prostate cancer ([place])   . Shortness of breath   . Stroke [facility]) 2010      PAST SURGICAL HISTORY: Past Surgical History:  Procedure Laterality Date  . angiograpy  2005   Revealing a moderate long segment occlusion within his right superficial femoral artery stent which i was able to re-canalize and re-stent for symptomatic claudication.  Marland Kitchen CARDIAC CATHETERIZATION     08/16/13  . CAROTID ENDARTERECTOMY    . COLONOSCOPY    . CORONARY ARTERY BYPASS GRAFT N/A 08/23/2013   Procedure: CORONARY ARTERY BYPASS GRAFTING (CABG);  Surgeon: Melrose Nakayama, MD;  Location: [place];  Service: Open Heart Surgery;  Laterality: N/A;  Coronary Artery Bypass graft times three using left internal mammary artery and left leg saphenous vein   . INTRAOPERATIVE TRANSESOPHAGEAL ECHOCARDIOGRAM N/A 08/23/2013   Procedure: INTRAOPERATIVE TRANSESOPHAGEAL ECHOCARDIOGRAM;  Surgeon:  Melrose Nakayama, MD;  Location: [place];  Service: Open Heart Surgery;  Laterality: N/A;  . LEFT HEART CATHETERIZATION WITH CORONARY ANGIOGRAM N/A 08/16/2013   Procedure: LEFT HEART CATHETERIZATION WITH CORONARY ANGIOGRAM;  Surgeon: Lorretta Harp, MD;  Location: [facility] CATH LAB;  Service: Cardiovascular;  Laterality: N/A;  . LOWER EXTREMITY ANGIOGRAM N/A 08/16/2013   Procedure: LOWER EXTREMITY ANGIOGRAM;  Surgeon: Lorretta Harp,  MD;  Location: [facility] CATH LAB;  Service: Cardiovascular;  Laterality: N/A;  . stents  2004   He had left common ilac artery percutaneous transluminal angioplasty and stenting and bilateral superfacial femoral artery percutaneous transluminal angioplasty and stenting.    FAMILY HISTORY:  Family History  Problem Relation Age of Onset  . Diabetes Mother   . Heart disease Father     SOCIAL HISTORY:  Social History   Social History  . Marital status: Married    Spouse name: N/A  . Number of children: N/A  . Years of education: N/A   Occupational History  . Not on file.   Social History Main Topics  . Smoking status: Former Smoker    Types: Cigarettes    Quit date: 05/04/2013  . Smokeless tobacco: Never Used     Comment: ' i SMOKE ONCE IN AWHILE "  . Alcohol use 2.5 oz/week    5 Standard drinks or equivalent per week  . Drug use: No  . Sexual activity: Not on file   Other Topics Concern  . Not on file   Social History Narrative  . No narrative on file    ALLERGIES: Patient has no known allergies.  MEDICATIONS:  Current Outpatient Prescriptions  Medication Sig Dispense Refill  . aspirin 81 MG chewable tablet Chew 1 tablet (81 mg total) by mouth daily.    . clopidogrel (PLAVIX) 75 MG tablet Take 75 mg by mouth daily with breakfast.     . Flaxseed, Linseed, (FLAX SEED OIL) 1000 MG CAPS Take 2,000 mg by mouth daily.     . metoprolol succinate (TOPROL-XL) 25 MG 24 hr tablet Take 1 tablet by mouth daily.    . simvastatin (ZOCOR) 20 MG tablet Take 1 tablet by mouth at bedtime.    . Multiple Vitamin (MULTIVITAMIN WITH MINERALS) TABS tablet Take 1 tablet by mouth daily.    . vitamin C (ASCORBIC ACID) 500 MG tablet Take 500 mg by mouth daily.     No current facility-administered medications for this encounter.     REVIEW OF SYSTEMS:  On review of systems, the patient reports that he is doing well overall. He denies any chest pain, shortness of breath, cough, fevers, chills,  night sweats, or unintended weight changes. He denies abdominal pain, nausea or vomiting. He reports frequent constipation and states that he uses suppository with some relief. He denies any new musculoskeletal or joint aches or pains. His IPSS is 15, indicating moderate urinary symptoms, including occasional urgency, intermittent stream, and weak flow. He has taken Alfuzosin with some relief but has been off this medication for the past several months.  He denies nocturia, dysuria, or hematuria. He has a history of ED and is only able to complete sexual activity less than half the time. He uses Cialis prn with success.  A complete review of systems is obtained and is otherwise negative.    PHYSICAL EXAM:  Wt Readings from Last 3 Encounters:  01/15/17 152 lb 2 oz (69 kg)  09/28/13 152 lb (68.9 kg)  09/27/13 156 lb 3.2  oz (70.9 kg)   Temp Readings from Last 3 Encounters:  01/15/17 97.8 F (36.6 C) (Oral)  08/29/13 98.1 F (36.7 C)  08/19/13 98 F (36.7 C)   BP Readings from Last 3 Encounters:  01/15/17 123/62  09/28/13 100/62  09/27/13 (!) 132/54   Pulse Readings from Last 3 Encounters:  01/15/17 60  09/28/13 61  09/27/13 61   Pain Assessment Pain Score: 0-No pain/10  In general this is a well appearing African-American male in no acute distress. He is alert and oriented x4 and appropriate throughout the examination. HEENT reveals that the patient is normocephalic, atraumatic. Cardiovascular exam reveals a regular rate and rhythm, no clicks rubs or murmurs are auscultated. Chest is clear to auscultation bilaterally. Lymphatic assessment is performed and does not reveal any adenopathy in the cervical, supraclavicular, axillary, or inguinal chains. Abdomen has active bowel sounds in all quadrants and is intact. The abdomen is soft, non tender, non distended. Lower extremities are negative for pretibial pitting edema, deep calf tenderness, cyanosis or clubbing.  KPS = 100  100 - Normal;  no complaints; no evidence of disease. 90   - Able to carry on normal activity; minor signs or symptoms of disease. 80   - Normal activity with effort; some signs or symptoms of disease. 70   - Cares for self; unable to carry on normal activity or to do active work. 60   - Requires occasional assistance, but is able to care for most of his personal needs. 50   - Requires considerable assistance and frequent medical care. 66   - Disabled; requires special care and assistance. 27   - Severely disabled; hospital admission is indicated although death not imminent. 31   - Very sick; hospital admission necessary; active supportive treatment necessary. 10   - Moribund; fatal processes progressing rapidly. 0     - Dead  Karnofsky DA, Abelmann [place], Craver LS and [facility] [phone]) The use of the nitrogen mustards in the palliative treatment of carcinoma: with particular reference to bronchogenic carcinoma Cancer 1 634-56  LABORATORY DATA:  Lab Results  Component Value Date   WBC 8.5 08/27/2013   HGB 9.4 (L) 08/27/2013   HCT 28.8 (L) 08/27/2013   MCV 89.7 08/27/2013   PLT 188 08/27/2013   Lab Results  Component Value Date   NA 141 08/27/2013   K 4.5 08/27/2013   CL 103 08/27/2013   CO2 27 08/27/2013   Lab Results  Component Value Date   ALT 25 March 26, 202015   AST 27 March 26, 202015   ALKPHOS 59 March 26, 202015   BILITOT 0.5 March 26, 202015     RADIOGRAPHY: No results found.    IMPRESSION/PLAN: 1. 66 y.o. gentleman with Stage T1c adenocarcinoma of the prostate with Gleason Score of 4+3, and PSA of 5.73.  Today we reviewed the findings and workup thus far.  We discussed the natural history of prostate cancer.  We reviewed the the implications of T-stage, Gleason's Score, and PSA on decision-making and outcomes in prostate cancer.  We discussed radiotherapy in the management of prostate cancer with regard to the logistics and delivery of external beam radiation treatment as well as the logistics and  delivery of prostate brachytherapy.  We compared and contrasted each of these approaches and also compared these against prostatectomy.  The patient expressed interest in prostate brachytherapy.  He falls into a select subset of patients with intermediate risk disease who are eligible for seed implant with less than half of one lobe  positive for Gleason 7 disease. Since his risk of extraprostatic extension is elevated on the [place] tables, we may need to employ SpaceOAR to allow for generous radiotherapy coverage around the peripheral lobes while sparing the rectum.  At the conclusion of our conversation, the patient is interested in moving forward with brachytherapy and use of SpaceOAR to reduce rectal toxicity from radiotherapy.  We will share our discussion with Dr. Adah Salvage and move forward with scheduling his CT [facility] planning appointment in the near future.  The patient met briefly with Romie Jumper in our office who will be working closely with him to coordinate OR scheduling and pre and post procedure appointments. We will contact the pharmaceutical rep to ensure that [employer] is available at the time of procedure. We will also contact Dr. Kennon Holter office for cardiac clearance prior to the procedure.  The patient will have a prostate MRI following his post-seed CT SIM to confirm appropriate distribution of the [place].   We enjoyed meeting with him today, and will look forward to participating in the care of this very nice gentleman.  We spent 45 minutes face to face with the patient and more than 50% of that time was spent in counseling and/or coordination of care.   Nicholos Johns, PA-C    Tyler Pita, MD  [facility] Direct Dial: [phone]  Fax: [phone] [email or web address]  Skype  LinkedIn  This document serves as a record of services personally performed by Tyler Pita, MD and Freeman Caldron, PA-C. It was created on their behalf by Rae Lips, a  trained medical scribe. The creation of this record is based on the scribe's personal observations and the providers' statements to them. This document has been checked and approved by the attending providers.

## 2017-01-15 NOTE — Telephone Encounter (Signed)
CALLED PATIENT TO INFORM OF APPT. WITH DR. [name] FOR CARDIAC CLEARANCE ON 02-18-17- ARRIVAL TIME - 8 AM @ [place], LVM FOR A RETURN CALL

## 2017-01-23 ENCOUNTER — Telehealth: Payer: Self-pay | Admitting: Medical Oncology

## 2017-01-23 NOTE — Telephone Encounter (Signed)
I spoke with Mr. Michael Maddox to introduce myself as the prostate nurse navigator and my role. He consulted with Dr. Tammi Klippel 01/15/17 and I was unable to meet him. He is interested in brachytherapy but has to have cardiac clearance. I verified with him the appointment with Dr. [name] 02/18/17 at 8:15am. I asked him to call me with questions or concerns and I will continue to follow.

## 2017-01-23 NOTE — Telephone Encounter (Signed)
Opened in error

## 2017-02-06 ENCOUNTER — Encounter: Payer: Self-pay | Admitting: Cardiovascular Disease

## 2017-02-06 ENCOUNTER — Ambulatory Visit (INDEPENDENT_AMBULATORY_CARE_PROVIDER_SITE_OTHER): Payer: Medicare Other | Admitting: Cardiovascular Disease

## 2017-02-06 VITALS — BP 144/70 | HR 61 | Ht 64.0 in | Wt 152.0 lb

## 2017-02-06 DIAGNOSIS — I6529 Occlusion and stenosis of unspecified carotid artery: Secondary | ICD-10-CM

## 2017-02-06 DIAGNOSIS — I10 Essential (primary) hypertension: Secondary | ICD-10-CM | POA: Diagnosis not present

## 2017-02-06 DIAGNOSIS — E7800 Pure hypercholesterolemia, unspecified: Secondary | ICD-10-CM

## 2017-02-06 DIAGNOSIS — I739 Peripheral vascular disease, unspecified: Secondary | ICD-10-CM

## 2017-02-06 DIAGNOSIS — Z951 Presence of aortocoronary bypass graft: Secondary | ICD-10-CM

## 2017-02-06 NOTE — Assessment & Plan Note (Signed)
History of hyperlipidemia on statin therapy followed by his PCP 

## 2017-02-06 NOTE — Assessment & Plan Note (Signed)
History of peripheral arterial disease status post left iliac and bilateral SFA intervention by myself in 2005. He does get less tolerating claudication. We have not gotten Dopplers on him in several years. We will recheck lower extremity until Doppler studies.

## 2017-02-06 NOTE — Assessment & Plan Note (Signed)
History of essential hypertension blood pressures measured at 144/70. He is on metoprolol. Continue current meds at current dosing.

## 2017-02-06 NOTE — Assessment & Plan Note (Signed)
History of carotid artery disease status post right carotid endarterectomy back in 2010. His most recent carotid Dopplers performed 07/14/13 revealed a widely patent endarterectomy site following mild to moderate disease and moderately severe left ICA stenosis. He does have bilateral carotid bruits. We will recheck carotid Doppler studies.

## 2017-02-06 NOTE — Patient Instructions (Signed)
Medication Instructions: Your physician recommends that you continue on your current medications as directed. Please refer to the Current Medication list given to you today.   Testing/Procedures: Your physician has requested that you have a carotid duplex. This test is an ultrasound of the carotid arteries in your neck. It looks at blood flow through these arteries that supply the brain with blood. Allow one hour for this exam. There are no restrictions or special instructions.  Your physician has requested that you have a lower extremity arterial duplex. During this test, ultrasound is used to evaluate arterial blood flow in the legs. Allow one hour for this exam. There are no restrictions or special instructions.  Your physician has requested that you have an ankle brachial index (ABI). During this test an ultrasound and blood pressure cuff are used to evaluate the arteries that supply the arms and legs with blood. Allow thirty minutes for this exam. There are no restrictions or special instructions.  Follow-Up: Your physician wants you to follow-up in: 1 year with Dr. [name]. You will receive a reminder letter in the mail two months in advance. If you don't receive a letter, please call our office to schedule the follow-up appointment.  If you need a refill on your cardiac medications before your next appointment, please call your pharmacy.

## 2017-02-06 NOTE — Progress Notes (Signed)
02/06/2017 Michael Maddox   20-Jan-1951  [id number]  Primary Physician Nolene Ebbs, MD Primary Cardiologist: Lorretta Harp MD [name], [place]  HPI:  Michael Maddox is a 66 y.o. male  father of 3 children, grandfather of 3 grandchildren who is a patient of Dr. Christean Grief Avebuere's he has a history of peripheral arterial disease. I last saw him in the office 08/04/13. He is status post left iliac and bilateral SFA intervention most recently in 2005 by myself. He discontinued tobacco abuse at that time. He also has 2 hypertension and hyperlipidemia. He had right carotid endarterectomy performed by Dr. [name] in November 2010 for high-grade disease in the setting of a stroke. He denies chest pain but does get somewhat shortness of breath which has not changed. His last functional study was performed 12 years ago and was unremarkable. The palpitations are fairly recent onset. He can "hear his heartbeat in his ears" and does feel somewhat dizzy/presyncopal. A Doppler study of his lower extremities revealed severe arterial insufficiency with a right ABI 0.25 the left of 0.39. He had severe iliac and SFA disease. Carotid Dopplers were performed as well which revealed moderate right carotid disease and moderate to severe left. His Myoview stress test that showed inferoseptal ischemia thought to be "high risk". He enjoyed cardiac catheterization by myself 08/16/13 revealing three-vessel disease. 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 Merilynn Finland 08/23/13 with a free LIMA to his LAD, vein to an obtuse marginal branch and PDA. I see him back for over 3 years. He does complain of less tolerating claudication but denies chest pain or shortness of breath. Importantly, he has recently been diagnosed with prostate cancer and is being scheduled for radioactive seed implantation. He is here for preoperative  clearance.  Current Meds  Medication Sig  . aspirin 81 MG chewable tablet Chew 1 tablet (81 mg total) by mouth daily.  . clopidogrel (PLAVIX) 75 MG tablet Take 75 mg by mouth daily with breakfast.   . Flaxseed, Linseed, (FLAX SEED OIL) 1000 MG CAPS Take 2,000 mg by mouth daily.   . metoprolol succinate (TOPROL-XL) 25 MG 24 hr tablet Take 1 tablet by mouth daily.  . Multiple Vitamin (MULTIVITAMIN WITH MINERALS) TABS tablet Take 1 tablet by mouth daily.  . simvastatin (ZOCOR) 20 MG tablet Take 1 tablet by mouth at bedtime.  . vitamin C (ASCORBIC ACID) 500 MG tablet Take 500 mg by mouth daily.     No Known Allergies  Social History   Social History  . Marital status: Married    Spouse name: N/A  . Number of children: N/A  . Years of education: N/A   Occupational History  . Not on file.   Social History Main Topics  . Smoking status: Former Smoker    Types: Cigarettes    Quit date: 05/04/2013  . Smokeless tobacco: Never Used     Comment: ' i SMOKE ONCE IN AWHILE "  . Alcohol use 2.5 oz/week    5 Standard drinks or equivalent per week  . Drug use: No  . Sexual activity: Not on file   Other Topics Concern  . Not on file   Social History Narrative  . No narrative on file     Review of Systems: General: negative for chills, fever, night sweats or weight changes.  Cardiovascular: negative for chest pain, dyspnea on exertion,  edema, orthopnea, palpitations, paroxysmal nocturnal dyspnea or shortness of breath Dermatological: negative for rash Respiratory: negative for cough or wheezing Urologic: negative for hematuria Abdominal: negative for nausea, vomiting, diarrhea, bright red blood per rectum, melena, or hematemesis Neurologic: negative for visual changes, syncope, or dizziness All other systems reviewed and are otherwise negative except as noted above.    Blood pressure (!) 144/70, pulse 61, height 5\' 4"  (1.626 m), weight 152 lb (68.9 kg).  General appearance: alert  and no distress Neck: no adenopathy, no JVD, supple, symmetrical, trachea midline, thyroid not enlarged, symmetric, no tenderness/mass/nodules and Bilateral carotid bruits Lungs: clear to auscultation bilaterally Heart: regular rate and rhythm, S1, S2 normal, no murmur, click, rub or gallop Extremities: extremities normal, atraumatic, no cyanosis or edema Pulses: Diminished pedal pulses bilaterally Skin: Skin color, texture, turgor normal. No rashes or lesions Neurologic: Alert and oriented X 3, normal strength and tone. Normal symmetric reflexes. Normal coordination and gait  EKG sinus rhythm at 61 without ST or T-wave changes. I personally reviewed this EKG.  ASSESSMENT AND PLAN:   Peripheral arterial disease History of peripheral arterial disease status post left iliac and bilateral SFA intervention by myself in 2005. He does get less tolerating claudication. We have not gotten Dopplers on him in several years. We will recheck lower extremity until Doppler studies.  Carotid artery disease History of carotid artery disease status post right carotid endarterectomy back in 2010. His most recent carotid Dopplers performed 07/14/13 revealed a widely patent endarterectomy site following mild to moderate disease and moderately severe left ICA stenosis. He does have bilateral carotid bruits. We will recheck carotid Doppler studies.  Essential hypertension History of essential hypertension blood pressures measured at 144/70. He is on metoprolol. Continue current meds at current dosing.  Hyperlipidemia History of hyperlipidemia on statin therapy followed by his PCP.  S/P CABG x 3 History of coronary artery disease status post cardiac catheterization by myself 08/16/13 after an abnormal "high risk" Myoview stress test. This revealed three-vessel disease. He subsequently underwent coronary artery bypass grafting 3 by Dr. Merilynn Finland 08/23/13 with a free LIMA to the LAD, vein to an obtuse marginal  branch and PDA. He has been a symptomatic since.      Lorretta Harp MD FACP,FACC,FAHA, [facility] 02/06/2017 2:51 PM .

## 2017-02-06 NOTE — Assessment & Plan Note (Signed)
History of coronary artery disease status post cardiac catheterization by myself 08/16/13 after an abnormal "high risk" Myoview stress test. This revealed three-vessel disease. He subsequently underwent coronary artery bypass grafting 3 by Dr. Merilynn Finland 08/23/13 with a free LIMA to the LAD, vein to an obtuse marginal branch and PDA. He has been a symptomatic since.

## 2017-02-11 ENCOUNTER — Other Ambulatory Visit: Payer: Self-pay | Admitting: Cardiovascular Disease

## 2017-02-11 DIAGNOSIS — I700 Atherosclerosis of aorta: Secondary | ICD-10-CM

## 2017-02-11 NOTE — Addendum Note (Signed)
Addended by: Ulice Brilliant T on: 02/11/2017 07:40 AM   Modules accepted: Orders

## 2017-02-18 ENCOUNTER — Ambulatory Visit: Payer: Medicare Other | Admitting: Cardiovascular Disease

## 2017-02-18 DIAGNOSIS — L309 Dermatitis, unspecified: Secondary | ICD-10-CM | POA: Diagnosis not present

## 2017-02-18 DIAGNOSIS — L0100 Impetigo, unspecified: Secondary | ICD-10-CM | POA: Diagnosis not present

## 2017-02-18 DIAGNOSIS — L089 Local infection of the skin and subcutaneous tissue, unspecified: Secondary | ICD-10-CM | POA: Diagnosis not present

## 2017-02-20 ENCOUNTER — Other Ambulatory Visit: Payer: Self-pay | Admitting: Urology

## 2017-02-20 ENCOUNTER — Telehealth: Payer: Self-pay | Admitting: *Deleted

## 2017-02-20 ENCOUNTER — Telehealth: Payer: Self-pay | Admitting: Cardiovascular Disease

## 2017-02-20 NOTE — Telephone Encounter (Signed)
New Message  Michael Maddox from Dr. Johny Shears office call requesting to speak with RN about getting written forms for how long pt needs to be off of his Plavix for surgical clearance. Please call back to discuss if needed.

## 2017-02-20 NOTE — Telephone Encounter (Signed)
CALLED PATIENT TO INFORM OF PRE-SEED PLANNING CT ON 03-07-17 AND HIS IMPLANT ON 04-11-17 , SPOKE WITH PATIENT AND HE IS AWARE OF THESE APPTS.

## 2017-02-21 ENCOUNTER — Ambulatory Visit (HOSPITAL_BASED_OUTPATIENT_CLINIC_OR_DEPARTMENT_OTHER)
Admission: RE | Admit: 2017-02-21 | Discharge: 2017-02-21 | Disposition: A | Discharge status: Home / Self Care | Payer: Medicare Other | Source: Ambulatory Visit | Attending: Cardiology | Admitting: Cardiology

## 2017-02-21 ENCOUNTER — Ambulatory Visit (HOSPITAL_BASED_OUTPATIENT_CLINIC_OR_DEPARTMENT_OTHER)
Admission: RE | Admit: 2017-02-21 | Discharge: 2017-02-21 | Disposition: A | Discharge status: Home / Self Care | Payer: Medicare Other | Source: Ambulatory Visit | Attending: Cardiovascular Disease | Admitting: Cardiovascular Disease

## 2017-02-21 ENCOUNTER — Ambulatory Visit (HOSPITAL_COMMUNITY)
Admission: RE | Admit: 2017-02-21 | Discharge: 2017-02-21 | Disposition: A | Discharge status: Home / Self Care | Payer: Medicare Other | Source: Ambulatory Visit | Attending: Cardiology | Admitting: Cardiology

## 2017-02-21 ENCOUNTER — Other Ambulatory Visit: Payer: Self-pay | Admitting: Cardiovascular Disease

## 2017-02-21 DIAGNOSIS — I6529 Occlusion and stenosis of unspecified carotid artery: Secondary | ICD-10-CM

## 2017-02-21 DIAGNOSIS — I700 Atherosclerosis of aorta: Secondary | ICD-10-CM | POA: Insufficient documentation

## 2017-02-21 DIAGNOSIS — I739 Peripheral vascular disease, unspecified: Secondary | ICD-10-CM

## 2017-02-25 ENCOUNTER — Telehealth: Payer: Self-pay | Admitting: Cardiovascular Disease

## 2017-02-25 NOTE — Telephone Encounter (Signed)
Spoke with Romie Minus Howie Ill). These messages have been inadvertantly routed to C CV DIV PREOP CALLBACK instead of P CV DIV PREOP CALLBACK therefore have been dispersed to multiple people not involved in this chart; she is aware to use "P" prefix. This message was already routed back to the preop pool as intended by Lenice Llamas. Dayna Dunn PA-C

## 2017-02-25 NOTE — Telephone Encounter (Signed)
Follow up   Patient has already been cleared, instructions for medication was not included, please advise on the Plavix and aspirin       [facility] Pre-operative Risk Assessment    Request for surgical clearance:  1. What type of surgery is being performed? Radioactive seed implant OAR  2. When is this surgery scheduled? 04/11/17  3. Are there any medications that need to be held prior to surgery and how long? plavix (48 hr )  and aspirin (5 to 7 day)  4. Practice name and name of physician performing surgery? Dr Consuella Lose  5. What is your office phone and fax number?  [phone]  Fax [phone]   6. Anesthesia type (None, local, MAC, general) ? General    Michael Maddox 02/25/2017, 4:13 PM  _________________________________________________________________   (provider comments below)

## 2017-02-25 NOTE — Telephone Encounter (Signed)
Call came into my pt call basket. Lenice Llamas, RN had routed to [employer] accordingly.

## 2017-02-27 NOTE — Telephone Encounter (Signed)
Please advise on Plavix

## 2017-02-27 NOTE — Telephone Encounter (Signed)
Clearance routed to Dr. Karsten Ro via Epic at [phone]

## 2017-02-27 NOTE — Telephone Encounter (Signed)
Okay to interrupt antiplatelet medications for surgery

## 2017-02-27 NOTE — Telephone Encounter (Signed)
Reviewed pt chart. Per Nelta Numbers clearance has been routed to Dr. Karsten Ro with OK to interrupt Plavix per Dr. [name].   Will close encounter.

## 2017-03-06 ENCOUNTER — Telehealth: Payer: Self-pay | Admitting: *Deleted

## 2017-03-06 ENCOUNTER — Other Ambulatory Visit (HOSPITAL_COMMUNITY): Payer: Self-pay | Admitting: *Deleted

## 2017-03-06 NOTE — Telephone Encounter (Signed)
CALLED PATIENT TO REMIND OF PRE-SEED APPTS. FOR 03-07-17, SPOKE WITH PATIENT AND HE IS AWARE OF THESE APPTS.

## 2017-03-07 ENCOUNTER — Ambulatory Visit
Admission: RE | Admit: 2017-03-07 | Discharge: 2017-03-07 | Disposition: A | Discharge status: Home / Self Care | Payer: Medicare Other | Source: Ambulatory Visit | Attending: Radiation Oncology | Admitting: Radiation Oncology

## 2017-03-07 ENCOUNTER — Encounter: Payer: Self-pay | Admitting: Medical Oncology

## 2017-03-07 ENCOUNTER — Encounter (HOSPITAL_COMMUNITY)
Admission: RE | Admit: 2017-03-07 | Discharge: 2017-03-07 | Disposition: A | Discharge status: Home / Self Care | Payer: Medicare Other | Source: Ambulatory Visit | Attending: Urology | Admitting: Urology

## 2017-03-07 ENCOUNTER — Inpatient Hospital Stay
Admission: RE | Admit: 2017-03-07 | Discharge: 2017-03-07 | Disposition: A | Discharge status: Home / Self Care | Payer: Medicare Other | Source: Ambulatory Visit | Attending: Radiation Oncology | Admitting: Radiation Oncology

## 2017-03-07 ENCOUNTER — Other Ambulatory Visit: Payer: Self-pay

## 2017-03-07 ENCOUNTER — Ambulatory Visit (HOSPITAL_COMMUNITY)
Admission: RE | Admit: 2017-03-07 | Discharge: 2017-03-07 | Disposition: A | Discharge status: Home / Self Care | Payer: Medicare Other | Source: Ambulatory Visit | Attending: Urology | Admitting: Urology

## 2017-03-07 DIAGNOSIS — C61 Malignant neoplasm of prostate: Secondary | ICD-10-CM

## 2017-03-07 DIAGNOSIS — J449 Chronic obstructive pulmonary disease, unspecified: Secondary | ICD-10-CM | POA: Insufficient documentation

## 2017-03-07 DIAGNOSIS — I493 Ventricular premature depolarization: Secondary | ICD-10-CM | POA: Insufficient documentation

## 2017-03-07 DIAGNOSIS — Z01818 Encounter for other preprocedural examination: Secondary | ICD-10-CM

## 2017-03-07 NOTE — Progress Notes (Signed)
  Radiation Oncology         [phone] ________________________________  Name: Michael Maddox MRN: [id number]  Date: 03/07/2017  DOB: 19-Jun-1950  SIMULATION AND TREATMENT PLANNING NOTE PUBIC ARCH STUDY  XT:GGYIRSW, Christean Grief, MD  Kathie Rhodes, MD  DIAGNOSIS: 66 y.o. gentleman with Stage T1c adenocarcinoma of the prostate with Gleason Score of 4+3, and PSA of 5.73    ICD-10-CM   1. Adenocarcinoma of prostate ([place]) C61     COMPLEX SIMULATION:  The patient presented today for evaluation for possible prostate seed implant. He was brought to the radiation planning suite and placed supine on the CT couch. A 3-dimensional image study set was obtained in upload to the planning computer. There, on each axial slice, I contoured the prostate gland. Then, using three-dimensional radiation planning tools I reconstructed the prostate in view of the structures from the transperineal needle pathway to assess for possible pubic arch interference. In doing so, I did not appreciate any pubic arch interference. Also, the patient's prostate volume was estimated based on the drawn structure. The volume was 47 cc.  Given the pubic arch appearance and prostate volume, patient remains a good candidate to proceed with prostate seed implant. Today, he freely provided informed written consent to proceed.    PLAN: The patient will undergo prostate seed implant.   ________________________________  Sheral Apley. Tammi Klippel, M.D.  This document serves as a record of services personally performed by Tyler Pita, MD. It was created on his behalf by Rae Lips, a trained medical scribe. The creation of this record is based on the scribe's personal observations and the provider's statements to them. This document has been checked and approved by the attending provider.

## 2017-03-11 ENCOUNTER — Encounter: Payer: Self-pay | Admitting: Cardiovascular Disease

## 2017-03-11 ENCOUNTER — Ambulatory Visit: Payer: Medicare Other | Admitting: Cardiovascular Disease

## 2017-03-11 VITALS — BP 126/58 | HR 64 | Ht 64.0 in | Wt 154.0 lb

## 2017-03-11 DIAGNOSIS — I739 Peripheral vascular disease, unspecified: Secondary | ICD-10-CM | POA: Diagnosis not present

## 2017-03-11 NOTE — Assessment & Plan Note (Signed)
Michael Maddox returns today for follow-up of his Doppler studies. I placed SFA stents in him in 2005. Dopplers showed ABIs in the 0.5 range bilaterally that appear to be bilateral SFA occlusions. He does have lifestyle limiting claudication. He can only walk one to 2 blocks before stopping. He wishes to have this evaluated and potentially treated endovascularly. We will arrange to do this after the first year.

## 2017-03-11 NOTE — Assessment & Plan Note (Signed)
History of moderate left ICA stenosis by recent ultrasound which has remained stable. This will be followed on an annual basis.

## 2017-03-11 NOTE — Patient Instructions (Signed)
   [facility] [address] [place] [address] Dept: [phone] Loc: [place]  03/11/2017  You are scheduled for a Peripheral Angiogram on Thursday, January 17 with Dr. Quay Burow.  1. Please arrive at the [facility] (Main Entrance A) at [facility]: [address] [place], [place] [address] at 5:30 AM (two hours before your procedure to ensure your preparation). Free valet parking service is available.   Special note: Every effort is made to have your procedure done on time. Please understand that emergencies sometimes delay scheduled procedures.  2. Diet: Do not eat or drink anything after midnight prior to your procedure except sips of water to take medications.  3. Labs: You will need to have blood drawn on Monday, January 7 in our office. No appointment is required for this. Our lab opens at 8 AM. There is a sign in sheet in the lobby for labs. Please bring your lab slips with you.  4. Medication instructions in preparation for your procedure:  On the morning of your procedure, take your aspirin and Plavix/Clopidogrel and any morning medicines NOT listed above.  You may use sips of water.  5. Plan for one night stay--bring personal belongings. 6. Bring a current list of your medications and current insurance cards. 7. You MUST have a responsible person to drive you home. 8. Someone MUST be with you the first 24 hours after you arrive home or your discharge will be delayed. 9. Please wear clothes that are easy to get on and off and wear slip-on shoes.  Thank you for allowing [place] to care for you!   --  Invasive Cardiovascular services   Post-procedure Follow-up:  1 Week after procedure: Your physician has requested that you have a lower extremity arterial duplex. During this test, ultrasound is used to evaluate arterial blood flow in the legs.  Allow one hour for this exam. There are no restrictions or special instructions.  Your physician has requested that you have an ankle brachial index (ABI). During this test an ultrasound and blood pressure cuff are used to evaluate the arteries that supply the arms and legs with blood. Allow thirty minutes for this exam. There are no restrictions or special instructions.  Your physician recommends that you schedule a follow-up appointment 2 weeks after procedure with Dr. [name].

## 2017-03-11 NOTE — Progress Notes (Signed)
Michael Maddox returns here for follow-up of his lower extremity arterial Dopplers and carotid Doppler studies. His lower extremity Dopplers revealed ABIs and 0.5 range bilaterally with occluded SFA stents. He does suffer from limiting claudication and wishes to have this addressed percutaneously. Carotid Dopplers revealed stable moderate left ICA stenosis.    Lorretta Harp, M.D., [place], [facility], [place] [address] [place], [place]  [address]  [phone] 03/11/2017 8:32 AM

## 2017-03-19 DIAGNOSIS — E1165 Type 2 diabetes mellitus with hyperglycemia: Secondary | ICD-10-CM | POA: Diagnosis not present

## 2017-03-19 DIAGNOSIS — K219 Gastro-esophageal reflux disease without esophagitis: Secondary | ICD-10-CM | POA: Diagnosis not present

## 2017-03-19 DIAGNOSIS — E7849 Other hyperlipidemia: Secondary | ICD-10-CM | POA: Diagnosis not present

## 2017-03-19 DIAGNOSIS — I10 Essential (primary) hypertension: Secondary | ICD-10-CM | POA: Diagnosis not present

## 2017-03-26 DIAGNOSIS — B958 Unspecified staphylococcus as the cause of diseases classified elsewhere: Secondary | ICD-10-CM | POA: Diagnosis not present

## 2017-03-26 DIAGNOSIS — L309 Dermatitis, unspecified: Secondary | ICD-10-CM | POA: Diagnosis not present

## 2017-04-02 DIAGNOSIS — C61 Malignant neoplasm of prostate: Secondary | ICD-10-CM | POA: Diagnosis not present

## 2017-04-03 ENCOUNTER — Telehealth: Payer: Self-pay | Admitting: *Deleted

## 2017-04-03 NOTE — Telephone Encounter (Signed)
Called patient to remind of labs for implant, spoke with patient and he is aware of this appt.

## 2017-04-04 ENCOUNTER — Encounter (HOSPITAL_COMMUNITY)
Admission: RE | Admit: 2017-04-04 | Discharge: 2017-04-04 | Disposition: A | Discharge status: Home / Self Care | Payer: Medicare Other | Source: Ambulatory Visit | Attending: Urology | Admitting: Urology

## 2017-04-04 DIAGNOSIS — I739 Peripheral vascular disease, unspecified: Secondary | ICD-10-CM | POA: Insufficient documentation

## 2017-04-04 LAB — COMPREHENSIVE METABOLIC PANEL
ALT: 18 U/L (ref 17–63)
AST: 21 U/L (ref 15–41)
Albumin: 4.2 g/dL (ref 3.5–5.0)
Alkaline Phosphatase: 59 U/L (ref 38–126)
Anion gap: 5 (ref 5–15)
BUN: 12 mg/dL (ref 6–20)
CO2: 28 mmol/L (ref 22–32)
Calcium: 8.9 mg/dL (ref 8.9–10.3)
Chloride: 106 mmol/L (ref 101–111)
Creatinine, Ser: 0.8 mg/dL (ref 0.61–1.24)
GFR calc Af Amer: >60 mL/min (ref >60)
GFR calc non Af Amer: >60 mL/min (ref >60)
Glucose, Bld: 195 mg/dL — ABNORMAL HIGH (ref 65–99)
Potassium: 4.3 mmol/L (ref 3.5–5.1)
Sodium: 139 mmol/L (ref 135–145)
Total Bilirubin: 0.9 mg/dL (ref 0.3–1.2)
Total Protein: 7 g/dL (ref 6.5–8.1)

## 2017-04-04 LAB — CBC
HCT: 39.6 % (ref 39.0–52.0)
Hemoglobin: 12.9 g/dL — ABNORMAL LOW (ref 13.0–17.0)
MCH: 29.5 pg (ref 26.0–34.0)
MCHC: 32.6 g/dL (ref 30.0–36.0)
MCV: 90.4 fL (ref 78.0–100.0)
Platelets: 198 10*3/uL (ref 150–400)
RBC: 4.38 MIL/uL (ref 4.22–5.81)
RDW: 12.9 % (ref 11.5–15.5)
WBC: 4.4 10*3/uL (ref 4.0–10.5)

## 2017-04-04 LAB — PROTIME-INR
INR: 0.98
Prothrombin Time: 12.8 seconds (ref 11.4–15.2)

## 2017-04-04 LAB — APTT: aPTT: 28 seconds (ref 24–36)

## 2017-04-07 NOTE — H&P (Signed)
HPI: Michael Maddox is a 66 year-old male with newly diagnosed intermediate risk adenocarcinoma of the prostate.  His last PSA was performed 10/17/2016. The last PSA value was 5.73.   He has had a prostate biopsy. Patient does not have a family history of prostate cancer. The patient states he does not take 5 alpha reductase inhibitor medication.   The patient complains of lower urinary tract symptom(s) that include frequency, urgency, hesitancy, weak stream, intermittency, and straining.   Interval history 11/20/16: He presents today for TRUS/BX due to an elevated PSA but no abnormality on DRE.   Interval history 11/27/16: He returns having undergone TRUS/BX due to an elevated PSA of 5.73 with no abnormality noted on DRE.  TRUS/BX 11/20/16: Prostate volume - 36cc  Stage: T1c  Pathology: Adenocarcinoma Gleason score 4+3 = 7 in 3 cores and 3+4 = 7 in 1 core on from the right lobe.   01/07/17: He came in with his wife today to discuss options for treatment.     ALLERGIES: No Allergies    MEDICATIONS: Sildenafil 20 mg tablet Take 2-5 tablets as needed for sexual activity  Aspirin Ec 81 mg tablet, delayed release Oral  Cialis 20 mg tablet 0 Oral  Levaquin 750 mg tablet Take the morning of your prostate biopsy.  Metoprolol Succinate 25 mg tablet, extended release 24 hr Oral  Plavix 75 mg tablet Oral  Simvastatin 20 mg tablet Oral     GU PSH: Prostate Needle Biopsy - 11/20/2016      PSH Notes: CABG, Endarterectomy   NON-GU PSH: CABG (coronary artery bypass grafting) - 2017 Surgical Pathology, Gross And Microscopic Examination For Prostate Needle - 11/20/2016    GU PMH: Prostate Cancer, After discussing the options today he told me that he would give the various forms of treatment consideration but he thinks that he would like to proceed with radioactive seed implant at this time. I told him to give it some thought and we would discuss this further when he returns. - 12/18/2016 Elevated PSA  (Stable), I have discussed with the patient the possibility of blood per rectum, per urethra and in the ejaculate. He was counseled to contact me if he has any difficulties following his prostate biopsy whatsoever. - 11/20/2016, (Worsening), I have discussed with the patient the possible need for further evaluation of his elevated PSA. We have discussed the options which would be continued observation with serial DRE and PSAs versus proceeding with further evaluation at this time with TRUS/Bx. We have discussed the possible risk of progression and spread of prostate cancer if present currently as well as the fact that typically prostate cancer tends to be a relatively slow-growing form of cancer that typically would not progress significantly over the relative short period of time between serial examinations. We also have discussed proceeding at this time with a prostate biopsy and I therefore have gone over the procedure with him in detail. We have discussed its potential risks and complications as well as limitations. I have answered all of his questions regarding this procedure to his satisfaction and he has elected to proceed with a prostate biopsy at this time., - 10/24/2016, Elevated PSA, - 2017 Urinary Frequency (Stable), He has some intermittent frequency and urgency during the day as well as a weak stream but does not get up at night at all. He is currently on alfuzosin. He will remain on this. - 10/24/2016 BPH w/LUTS, Benign prostatic hypertrophy with weak urinary stream - 2017 ED due  to arterial insufficiency, Erectile dysfunction due to arterial insufficiency - 2017      PMH Notes: Elevated PSA: He was found to have an elevated PSA of 4.29 in 11/16 and was asymptomatic with no family history and unremarkable DRE. His PSA in 1/17 was 4.59/18%.  4/12 - 1.25  8/13 - 1.91  5/14 - 2.12  11/16 - 4.29    Erectile dysfunction: He has tried Viagra but finds that Cialis was more effective for him. He has  significant peripheral vascular disease as a primary risk factor.    LUTS; He has voiding symptoms that have been present for several years. They consist of frequency in the daytime but no significant nocturia. He does have some urgency and also experiences, intermittency and hesitancy and a weak urinary stream.     NON-GU PMH: Encounter for general adult medical examination without abnormal findings, Encounter for preventive health examination - 2017 Personal history of other diseases of the circulatory system, History of carotid artery stenosis - 03/20/2015, History of hypertension, - 03/20/2015, History of cardiovascular disorder, - 03/20/2015 Personal history of transient ischemic attack (TIA), and cerebral infarction without residual deficits, History of stroke - 03/20/2015 Prediabetes, Borderline diabetes - 03/20/2015    FAMILY HISTORY: malignant neoplasm of breast - Runs In Family Strokes - Runs In Family   SOCIAL HISTORY: Marital Status: Married Preferred Language: English; Race: Black or African American Current Smoking Status: Patient does not smoke anymore. Has not smoked since 11/21/2010.  Drinks 1 drink per day. Types of alcohol consumed: Liquor.  Does not use drugs. Drinks 4+ caffeinated drinks per day.     Notes: Married, Caffeine use, Occupation, Number of children, Former smoker, Alcohol use   REVIEW OF SYSTEMS:    GU Review Male:   Patient denies frequent urination, hard to postpone urination, burning/ pain with urination, get up at night to urinate, leakage of urine, stream starts and stops, trouble starting your stream, have to strain to urinate , erection problems, and penile pain.  Gastrointestinal (Upper):   Patient denies nausea, vomiting, and indigestion/ heartburn.  Gastrointestinal (Lower):   Patient denies diarrhea and constipation.  Constitutional:   Patient denies fever, night sweats, weight loss, and fatigue.  Skin:   Patient denies skin rash/ lesion and  itching.  Eyes:   Patient denies blurred vision and double vision.  Ears/ Nose/ Throat:   Patient denies sore throat and sinus problems.  Hematologic/Lymphatic:   Patient denies swollen glands and easy bruising.  Cardiovascular:   Patient denies leg swelling and chest pains.  Respiratory:   Patient denies cough and shortness of breath.  Endocrine:   Patient denies excessive thirst.  Musculoskeletal:   Patient denies back pain and joint pain.  Neurological:   Patient denies headaches and dizziness.  Psychologic:   Patient denies depression and anxiety.   Constitutional: Well nourished and well developed . No acute distress.   ENT:. The ears and nose are normal in appearance.   Neck: The appearance of the neck is normal and no neck mass is present.   Pulmonary: No respiratory distress and normal respiratory rhythm and effort.   Cardiovascular: Heart rate and rhythm are normal . No peripheral edema.   Abdomen: The abdomen is soft and nontender. No masses are palpated. No CVA tenderness. No hernias are palpable. No hepatosplenomegaly noted.   Anus and Perineum: No hemorrhoids. No anal stenosis. No rectal fissure, no anal fissure. No edema, no dimple, no perineal tenderness, no anal tenderness.  Prostate: Prostate 2 + size. Left lobe normal consistency, right lobe normal consistency. Symmetrical lobes. No prostate nodule. Left lobe no tenderness, right lobe no tenderness.   Seminal Vesicles: Nonpalpable.  Sphincter Tone: Normal sphincter. No rectal tenderness. No rectal mass.   Lymphatics: The femoral and inguinal nodes are not enlarged or tender.   Skin: Normal skin turgor, no visible rash and no visible skin lesions.   Neuro/Psych:. Mood and affect are appropriate.  VITAL SIGNS:    Weight 155 lb / 70.31 kg  Height 71 in / 180.34 cm  BP 170/73 mmHg  Pulse 56 /min  BMI 21.6 kg/m   PAST DATA REVIEWED:  Source Of History:  Patient   10/17/16 03/20/16 11/16/15 08/24/15 05/03/15   PSA  Total PSA 5.73 ng/mL 4.1 ng/dl 4.67  5.10  4.59   Free PSA 0.73 ng/mL  0.78  0.71  0.82   % Free PSA 13 % PSA  17  14  18      PROCEDURES:          Urinalysis - 81003 Dipstick Dipstick Cont'd  Color: Yellow Bilirubin: Neg  Appearance: Clear Ketones: Neg  Specific Gravity: 1.020 Blood: Neg  pH: 6.5 Protein: Neg  Glucose: Neg Urobilinogen: 0.2    Nitrites: Neg    Leukocyte Esterase: Neg   ASSESSMENT:      ICD-10 Details  1 GU:   Prostate Cancer - C61 Stable - He made the decision to proceed with radioactive seeds.             Notes:   The patient was counseled about the natural history of prostate cancer and the standard treatment options that are available for prostate cancer. It was explained to him how his age and life expectancy, clinical stage, Gleason score, and PSA affect his prognosis, the decision to proceed with additional staging studies, as well as how that information influences recommended treatment strategies. We discussed the roles for active surveillance, radiation therapy, surgical therapy, androgen deprivation, as well as ablative therapy options for the treatment of prostate cancer as appropriate to his individual cancer situation. We discussed the risks and benefits of these options with regard to their impact on cancer control and also in terms of potential adverse events, complications, and impact on quality of life particularly related to urinary, bowel, and sexual function. The patient was encouraged to ask questions throughout the discussion today and all questions were answered to his stated satisfaction. In addition, the patient was provided with and/or directed to appropriate resources and literature for further education about prostate cancer and treatment options.  We discussed surgical therapy for prostate cancer including the different available surgical approaches. We discussed, in detail, the risks and expectations of surgery with regard to cancer  control, urinary control, and erectile function as well as the expected postoperative recovery process. The risks, potential complications/adverse events of radical prostatectomy as well as alternative options were explained to the patient.     PLAN:   After having considered his options for treatment and having seen Dr. Tammi Klippel he has elected to proceed with I-125 radioactive seed implant with [facility]

## 2017-04-08 ENCOUNTER — Other Ambulatory Visit: Payer: Self-pay

## 2017-04-08 ENCOUNTER — Encounter (HOSPITAL_BASED_OUTPATIENT_CLINIC_OR_DEPARTMENT_OTHER): Payer: Self-pay | Admitting: *Deleted

## 2017-04-08 NOTE — Progress Notes (Addendum)
SPOKE W/ PT VIA PHONE FOR PRE-OP INTERVIEW.  NPO AFTER MN.  ARRIVE AT [phone].  CURRENT LAB RESULTS, CXR, AND EKG IN CHART AND Epic.  WILL TAKE  DEXILANT AM DOS W/ SIPS OF WATER AND DO FLEET ENEMA.  PER PT TOLD TO STOP PLAVIX 48 HOURS PRIOR TO DOS AND ASA 5 DAYS PRIOR TO DOS.  PT HAS CARDIAC CLEARANCE BY DR BERRY IN CHART AND Epic.

## 2017-04-10 ENCOUNTER — Telehealth: Payer: Self-pay | Admitting: *Deleted

## 2017-04-10 NOTE — Telephone Encounter (Signed)
Called patient to remind of procedure for 04-11-17, spoke with patient and he is aware of this procedure.

## 2017-04-10 NOTE — Anesthesia Preprocedure Evaluation (Addendum)
Anesthesia Evaluation  Patient identified by MRN, date of birth, ID band Patient awake    Reviewed: Allergy & Precautions, H&P , NPO status , Patient's Chart, lab work & pertinent test results  History of Anesthesia Complications Negative for: history of anesthetic complications  Airway Mallampati: II  TM Distance: >3 FB Neck ROM: Full    Dental no notable dental hx. (+) Dental Advisory Given, Chipped   Pulmonary neg pulmonary ROS, former smoker,    Pulmonary exam normal breath sounds clear to auscultation       Cardiovascular hypertension, Pt. on home beta blockers + CAD and + Peripheral Vascular Disease  negative cardio ROS Normal cardiovascular exam Rhythm:Regular Rate:Normal     Neuro/Psych CVA, Residual Symptoms negative neurological ROS  negative psych ROS   GI/Hepatic negative GI ROS, Neg liver ROS, GERD  Medicated,  Endo/Other  negative endocrine ROS  Renal/GU negative Renal ROS  negative genitourinary   Musculoskeletal negative musculoskeletal ROS (+) Arthritis , Osteoarthritis,    Abdominal   Peds negative pediatric ROS (+)  Hematology negative hematology ROS (+)   Anesthesia Other Findings  Duplex [id number]  LICA 63-89%, bilateral ECA >50%,  patent RICA post intervention hx acute high grade stenosis RICA >80% 02-22-2009 in setting of  stroke  s/p  right carotid endarterectomy 02-28-2009/     Reproductive/Obstetrics negative OB ROS                            Anesthesia Physical  Anesthesia Plan  ASA: IV  Anesthesia Plan: General   Post-op Pain Management:    Induction: Intravenous  PONV Risk Score and Plan: 1 and Ondansetron and Treatment may vary due to age or medical condition  Airway Management Planned: LMA and Oral ETT  Additional Equipment:   Intra-op Plan:   Post-operative Plan: Extubation in OR  Informed Consent: I have reviewed the patients History and  Physical, chart, labs and discussed the procedure including the risks, benefits and alternatives for the proposed anesthesia with the patient or authorized representative who has indicated his/her understanding and acceptance.     Plan Discussed with: CRNA, Anesthesiologist and Surgeon  Anesthesia Plan Comments:         Anesthesia Quick Evaluation

## 2017-04-11 ENCOUNTER — Encounter (HOSPITAL_BASED_OUTPATIENT_CLINIC_OR_DEPARTMENT_OTHER)
Admission: RE | Disposition: A | Discharge status: Home / Self Care | Payer: Self-pay | Source: Ambulatory Visit | Attending: Urology

## 2017-04-11 ENCOUNTER — Ambulatory Visit (HOSPITAL_BASED_OUTPATIENT_CLINIC_OR_DEPARTMENT_OTHER): Payer: Medicare Other | Admitting: Anesthesiology

## 2017-04-11 ENCOUNTER — Encounter (HOSPITAL_BASED_OUTPATIENT_CLINIC_OR_DEPARTMENT_OTHER): Payer: Self-pay

## 2017-04-11 ENCOUNTER — Ambulatory Visit (HOSPITAL_BASED_OUTPATIENT_CLINIC_OR_DEPARTMENT_OTHER)
Admission: RE | Admit: 2017-04-11 | Discharge: 2017-04-11 | Disposition: A | Discharge status: Home / Self Care | Payer: Medicare Other | Source: Ambulatory Visit | Attending: Urology | Admitting: Urology

## 2017-04-11 ENCOUNTER — Ambulatory Visit (HOSPITAL_COMMUNITY): Payer: Medicare Other

## 2017-04-11 DIAGNOSIS — Z87891 Personal history of nicotine dependence: Secondary | ICD-10-CM | POA: Diagnosis not present

## 2017-04-11 DIAGNOSIS — N529 Male erectile dysfunction, unspecified: Secondary | ICD-10-CM | POA: Diagnosis not present

## 2017-04-11 DIAGNOSIS — Z951 Presence of aortocoronary bypass graft: Secondary | ICD-10-CM | POA: Diagnosis not present

## 2017-04-11 DIAGNOSIS — I739 Peripheral vascular disease, unspecified: Secondary | ICD-10-CM | POA: Diagnosis not present

## 2017-04-11 DIAGNOSIS — C61 Malignant neoplasm of prostate: Secondary | ICD-10-CM | POA: Diagnosis not present

## 2017-04-11 DIAGNOSIS — I10 Essential (primary) hypertension: Secondary | ICD-10-CM | POA: Diagnosis not present

## 2017-04-11 DIAGNOSIS — K219 Gastro-esophageal reflux disease without esophagitis: Secondary | ICD-10-CM | POA: Diagnosis not present

## 2017-04-11 DIAGNOSIS — Z8673 Personal history of transient ischemic attack (TIA), and cerebral infarction without residual deficits: Secondary | ICD-10-CM | POA: Insufficient documentation

## 2017-04-11 DIAGNOSIS — Z79899 Other long term (current) drug therapy: Secondary | ICD-10-CM | POA: Insufficient documentation

## 2017-04-11 DIAGNOSIS — I2510 Atherosclerotic heart disease of native coronary artery without angina pectoris: Secondary | ICD-10-CM | POA: Insufficient documentation

## 2017-04-11 DIAGNOSIS — E785 Hyperlipidemia, unspecified: Secondary | ICD-10-CM | POA: Diagnosis not present

## 2017-04-11 DIAGNOSIS — Z7902 Long term (current) use of antithrombotics/antiplatelets: Secondary | ICD-10-CM | POA: Insufficient documentation

## 2017-04-11 DIAGNOSIS — Z7982 Long term (current) use of aspirin: Secondary | ICD-10-CM | POA: Insufficient documentation

## 2017-04-11 HISTORY — PX: RADIOACTIVE SEED IMPLANT: SHX5150

## 2017-04-11 HISTORY — PX: CYSTOSCOPY: SHX5120

## 2017-04-11 HISTORY — DX: Presence of spectacles and contact lenses: Z97.3

## 2017-04-11 HISTORY — PX: SPACE OAR INSTILLATION: SHX6769

## 2017-04-11 HISTORY — DX: Presence of aortocoronary bypass graft: Z95.1

## 2017-04-11 HISTORY — DX: Hyperlipidemia, unspecified: E78.5

## 2017-04-11 HISTORY — DX: Peripheral vascular angioplasty status with implants and grafts: Z95.820

## 2017-04-11 HISTORY — DX: Poor urinary stream: R39.12

## 2017-04-11 HISTORY — DX: Peripheral vascular disease, unspecified: I73.9

## 2017-04-11 HISTORY — DX: Benign prostatic hyperplasia with lower urinary tract symptoms: N40.1

## 2017-04-11 HISTORY — DX: Chronic obstructive pulmonary disease, unspecified: J44.9

## 2017-04-11 LAB — GLUCOSE, CAPILLARY: Glucose-Capillary: 170 mg/dL — ABNORMAL HIGH (ref 65–99)

## 2017-04-11 SURGERY — INSERTION, RADIATION SOURCE, PROSTATE
Anesthesia: General | Site: Rectum

## 2017-04-11 MED ORDER — IOHEXOL 300 MG/ML  SOLN
INTRAMUSCULAR | Status: DC | PRN
  Administered 2017-04-11: 7 mL

## 2017-04-11 MED ORDER — SODIUM CHLORIDE 0.9 % IJ SOLN
INTRAMUSCULAR | Status: DC | PRN
  Administered 2017-04-11: 10 mL

## 2017-04-11 MED ORDER — KETOROLAC TROMETHAMINE 30 MG/ML IJ SOLN
30.0000 mg | Freq: Once | INTRAMUSCULAR | Status: DC | PRN
  Filled 2017-04-11: qty 1

## 2017-04-11 MED ORDER — DEXAMETHASONE SODIUM PHOSPHATE 10 MG/ML IJ SOLN
INTRAMUSCULAR | Status: DC | PRN
  Administered 2017-04-11: 10 mg via INTRAVENOUS

## 2017-04-11 MED ORDER — CIPROFLOXACIN IN D5W 400 MG/200ML IV SOLN
INTRAVENOUS | Status: AC
  Filled 2017-04-11: qty 200

## 2017-04-11 MED ORDER — MIDAZOLAM HCL 2 MG/2ML IJ SOLN
INTRAMUSCULAR | Status: DC | PRN
  Administered 2017-04-11 (×2): 1 mg via INTRAVENOUS

## 2017-04-11 MED ORDER — FENTANYL CITRATE (PF) 100 MCG/2ML IJ SOLN
INTRAMUSCULAR | Status: DC | PRN
  Administered 2017-04-11: 25 ug via INTRAVENOUS
  Administered 2017-04-11: 50 ug via INTRAVENOUS
  Administered 2017-04-11: 25 ug via INTRAVENOUS

## 2017-04-11 MED ORDER — GLYCOPYRROLATE 0.2 MG/ML IV SOSY
PREFILLED_SYRINGE | INTRAVENOUS | Status: DC | PRN
  Administered 2017-04-11: .2 mg via INTRAVENOUS

## 2017-04-11 MED ORDER — SULFAMETHOXAZOLE-TRIMETHOPRIM 800-160 MG PO TABS
1.0000 | ORAL_TABLET | Freq: Two times a day (BID) | ORAL | 0 refills | Status: DC
Start: 2017-04-11 — End: 2017-04-24

## 2017-04-11 MED ORDER — ONDANSETRON HCL 4 MG/2ML IJ SOLN
4.0000 mg | Freq: Once | INTRAMUSCULAR | Status: DC | PRN
  Filled 2017-04-11: qty 2

## 2017-04-11 MED ORDER — FLEET ENEMA 7-19 GM/118ML RE ENEM
1.0000 | ENEMA | Freq: Once | RECTAL | Status: DC
  Filled 2017-04-11: qty 1

## 2017-04-11 MED ORDER — PROPOFOL 10 MG/ML IV BOLUS
INTRAVENOUS | Status: DC | PRN
  Administered 2017-04-11: 30 mg via INTRAVENOUS
  Administered 2017-04-11: 170 mg via INTRAVENOUS

## 2017-04-11 MED ORDER — FENTANYL CITRATE (PF) 100 MCG/2ML IJ SOLN
25.0000 ug | INTRAMUSCULAR | Status: DC | PRN
  Filled 2017-04-11: qty 1

## 2017-04-11 MED ORDER — OXYCODONE HCL 5 MG/5ML PO SOLN
5.0000 mg | Freq: Once | ORAL | Status: DC | PRN
  Filled 2017-04-11: qty 5

## 2017-04-11 MED ORDER — EPHEDRINE SULFATE-NACL 50-0.9 MG/10ML-% IV SOSY
PREFILLED_SYRINGE | INTRAVENOUS | Status: DC | PRN
  Administered 2017-04-11: 10 mg via INTRAVENOUS

## 2017-04-11 MED ORDER — EPHEDRINE 5 MG/ML INJ
INTRAVENOUS | Status: AC
  Filled 2017-04-11: qty 10

## 2017-04-11 MED ORDER — LIDOCAINE 2% (20 MG/ML) 5 ML SYRINGE
INTRAMUSCULAR | Status: DC | PRN
  Administered 2017-04-11: 60 mg via INTRAVENOUS

## 2017-04-11 MED ORDER — ONDANSETRON HCL 4 MG/2ML IJ SOLN
INTRAMUSCULAR | Status: DC | PRN
  Administered 2017-04-11: 4 mg via INTRAVENOUS

## 2017-04-11 MED ORDER — CIPROFLOXACIN IN D5W 400 MG/200ML IV SOLN
400.0000 mg | INTRAVENOUS | Status: AC
  Administered 2017-04-11: 400 mg via INTRAVENOUS
  Filled 2017-04-11: qty 200

## 2017-04-11 MED ORDER — MIDAZOLAM HCL 2 MG/2ML IJ SOLN
INTRAMUSCULAR | Status: AC
  Filled 2017-04-11: qty 2

## 2017-04-11 MED ORDER — PROPOFOL 10 MG/ML IV BOLUS
INTRAVENOUS | Status: AC
  Filled 2017-04-11: qty 20

## 2017-04-11 MED ORDER — LIDOCAINE 2% (20 MG/ML) 5 ML SYRINGE
INTRAMUSCULAR | Status: AC
  Filled 2017-04-11: qty 5

## 2017-04-11 MED ORDER — ACETAMINOPHEN 160 MG/5ML PO SOLN
325.0000 mg | ORAL | Status: DC | PRN
  Filled 2017-04-11: qty 20.3

## 2017-04-11 MED ORDER — FENTANYL CITRATE (PF) 100 MCG/2ML IJ SOLN
INTRAMUSCULAR | Status: AC
  Filled 2017-04-11: qty 2

## 2017-04-11 MED ORDER — ONDANSETRON HCL 4 MG/2ML IJ SOLN
INTRAMUSCULAR | Status: AC
  Filled 2017-04-11: qty 2

## 2017-04-11 MED ORDER — DEXAMETHASONE SODIUM PHOSPHATE 10 MG/ML IJ SOLN
INTRAMUSCULAR | Status: AC
  Filled 2017-04-11: qty 1

## 2017-04-11 MED ORDER — SODIUM CHLORIDE 0.9 % IR SOLN
Status: DC | PRN
  Administered 2017-04-11: 1000 mL

## 2017-04-11 MED ORDER — OXYCODONE HCL 5 MG PO TABS
5.0000 mg | ORAL_TABLET | Freq: Once | ORAL | Status: DC | PRN
  Filled 2017-04-11: qty 1

## 2017-04-11 MED ORDER — ACETAMINOPHEN 325 MG PO TABS
325.0000 mg | ORAL_TABLET | ORAL | Status: DC | PRN
  Filled 2017-04-11: qty 2

## 2017-04-11 MED ORDER — MEPERIDINE HCL 25 MG/ML IJ SOLN
6.2500 mg | INTRAMUSCULAR | Status: DC | PRN
  Filled 2017-04-11: qty 1

## 2017-04-11 MED ORDER — HYDROCODONE-ACETAMINOPHEN 10-325 MG PO TABS: 1.0000 | ORAL_TABLET | ORAL | 0 refills | Status: DC | PRN

## 2017-04-11 MED ORDER — LACTATED RINGERS IV SOLN
INTRAVENOUS | Status: DC
  Administered 2017-04-11: 09:00:00 via INTRAVENOUS
  Filled 2017-04-11: qty 1000

## 2017-04-11 MED ORDER — GLYCOPYRROLATE 0.2 MG/ML IV SOSY
PREFILLED_SYRINGE | INTRAVENOUS | Status: AC
  Filled 2017-04-11: qty 5

## 2017-04-11 MED FILL — HYDROCODON-APAP 10-325: 10-325 | 2 days supply | Qty: 12 | Fill #0

## 2017-04-11 MED FILL — SULFAMETHOXAZOLE-TMP DS TAB: 800-160 | 3 days supply | Qty: 6 | Fill #0

## 2017-04-11 SURGICAL SUPPLY — 35 items
BAG URINE DRAINAGE (UROLOGICAL SUPPLIES) ×4 IMPLANT
BLADE CLIPPER SURG (BLADE) ×4 IMPLANT
CATH FOLEY 2WAY SLVR  5CC 16FR (CATHETERS) ×1
CATH FOLEY 2WAY SLVR 5CC 16FR (CATHETERS) ×3 IMPLANT
CATH ROBINSON RED A/P 16FR (CATHETERS) ×3 IMPLANT
CATH ROBINSON RED A/P 20FR (CATHETERS) ×4 IMPLANT
CLOTH BEACON ORANGE TIMEOUT ST (SAFETY) ×4 IMPLANT
COVER BACK TABLE 60X90IN (DRAPES) ×4 IMPLANT
COVER MAYO STAND STRL (DRAPES) ×4 IMPLANT
DRSG TEGADERM 4X4.75 (GAUZE/BANDAGES/DRESSINGS) ×7 IMPLANT
DRSG TEGADERM 8X12 (GAUZE/BANDAGES/DRESSINGS) ×7 IMPLANT
GAUZE SPONGE 4X4 8PLY STR LF (GAUZE/BANDAGES/DRESSINGS) ×1 IMPLANT
GLOVE BIO SURGEON STRL SZ 6.5 (GLOVE) ×1 IMPLANT
GLOVE BIO SURGEON STRL SZ8 (GLOVE) ×4 IMPLANT
GLOVE BIOGEL PI IND STRL 6.5 (GLOVE) ×4 IMPLANT
GLOVE BIOGEL PI IND STRL 7.5 (GLOVE) ×1 IMPLANT
GLOVE BIOGEL PI INDICATOR 6.5 (GLOVE) ×2
GLOVE BIOGEL PI INDICATOR 7.5 (GLOVE) ×1
GLOVE ECLIPSE 8.0 STRL XLNG CF (GLOVE) ×4 IMPLANT
GOWN STRL REUS W/TWL LRG LVL3 (GOWN DISPOSABLE) ×1 IMPLANT
GOWN STRL REUS W/TWL XL LVL3 (GOWN DISPOSABLE) ×4 IMPLANT
HOLDER FOLEY CATH W/STRAP (MISCELLANEOUS) IMPLANT
IMPL SPACEOAR SYSTEM 10ML (MISCELLANEOUS) ×3 IMPLANT
IMPLANT SPACEOAR SYSTEM 10ML (MISCELLANEOUS) ×4
IV NS 1000ML (IV SOLUTION) ×4
IV NS 1000ML BAXH (IV SOLUTION) ×3 IMPLANT
KIT RM TURNOVER CYSTO AR (KITS) ×4 IMPLANT
NUCLETRON SELECTSEED I-125 ×1 IMPLANT
PACK CYSTO (CUSTOM PROCEDURE TRAY) ×4 IMPLANT
SURGILUBE 2OZ TUBE FLIPTOP (MISCELLANEOUS) ×3 IMPLANT
SUT BONE WAX W31G (SUTURE) ×4 IMPLANT
SYRINGE 10CC LL (SYRINGE) ×1 IMPLANT
UNDERPAD 30X30 (UNDERPADS AND DIAPERS) ×8 IMPLANT
UNDERPAD 30X30 INCONTINENT (UNDERPADS AND DIAPERS) ×8 IMPLANT
WATER STERILE IRR 500ML POUR (IV SOLUTION) ×4 IMPLANT

## 2017-04-11 NOTE — Anesthesia Procedure Notes (Signed)
Procedure Name: LMA Insertion Date/Time: 04/11/2017 8:54 AM Performed by: Suan Halter, CRNA Pre-anesthesia Checklist: Patient identified, Emergency Drugs available, Suction available and Patient being monitored Patient Re-evaluated:Patient Re-evaluated prior to induction Oxygen Delivery Method: Circle system utilized Preoxygenation: Pre-oxygenation with 100% oxygen Induction Type: IV induction Ventilation: Mask ventilation without difficulty LMA: LMA inserted LMA Size: 4.0 Number of attempts: 1 Airway Equipment and Method: Bite block Placement Confirmation: positive ETCO2 Tube secured with: Tape Dental Injury: Teeth and Oropharynx as per pre-operative assessment

## 2017-04-11 NOTE — Discharge Instructions (Signed)

## 2017-04-11 NOTE — Op Note (Signed)
PATIENT:  Michael Maddox  PRE-OPERATIVE DIAGNOSIS:  Adenocarcinoma of the prostate  POST-OPERATIVE DIAGNOSIS:  Same  PROCEDURE:  1. I-125 radioactive seed implantation 2. Cystoscopy  3. Placement of SpaceOAR  SURGEON:  Surgeon(s): Claybon Jabs  Radiation oncologist: Dr. Tyler Pita  ANESTHESIA:  General  EBL:  Minimal  DRAINS: None  INDICATION: Michael Maddox is a 66 year old male with biopsy-proven adenocarcinoma of the prostate that is intermediate risk (Gleason 3+4 and 4+3.  His PSA at the time of diagnosis was 5.73 with no abnormality noted on DRE.  With pathologic stage T1c disease he has elected to proceed with radioactive seed implant for treatment of his cancer.  We also discussed placement of SpaceOAR.  Description of procedure: After informed consent the patient was brought to the major OR, placed on the table and administered general anesthesia. He was then moved to the modified lithotomy position with his perineum perpendicular to the floor. His perineum and genitalia were then sterilely prepped. An official timeout was then performed. A 16 French Foley catheter was then placed in the bladder and filled with dilute contrast, a rectal tube was placed in the rectum and the transrectal ultrasound probe was placed in the rectum and affixed to the stand. He was then sterilely draped.  Real time ultrasonography was used along with the seed planning software [employer] Prostate vs. 4.2.2.4. This was used to develop the seed plan including the number of needles as well as number of seeds required for complete and adequate coverage. Real-time ultrasonography was then used along with the previously developed plan and the Nucletron device to implant a total of 67 seeds using 20 needles. This proceeded without difficulty or complication.   I then proceeded with placement of SpaceOARby introducing a needle with the bevel angled inferiorly approximately 2 cm superior to the anus.  This was angled downward and under direct ultrasound was placed within the space between the prostatic capsule and rectum. This was confirmed with a small amount of sterile saline injected and this was performed under direct ultrasound. I then attached the SpaceOARto the needle and injected this in the space between the prostate and rectum with good placement noted.  A Foley catheter was then removed as well as the transrectal ultrasound probe and rectal probe. Flexible cystoscopy was then performed using the 17 French flexible scope which revealed a normal urethra throughout its length down to the sphincter which appeared intact. The prostatic urethra revealed bilobar hypertrophy but no evidence of obstruction, seeds, spacers or lesions. The bladder was then entered and fully and systematically inspected. The ureteral orifices were noted to be of normal configuration and position. The mucosa revealed no evidence of tumors. There were also no stones identified within the bladder. I noted no seeds or spacers on the floor of the bladder and retroflexion of the scope revealed no seeds protruding from the base of the prostate.  The cystoscope was then removed and the patient was awakened and taken to recovery room in stable and satisfactory condition. He tolerated procedure well and there were no intraoperative complications.

## 2017-04-11 NOTE — Anesthesia Postprocedure Evaluation (Signed)
Anesthesia Post Note  Patient: JAMELLE GOLDSTON  Procedure(s) Performed: RADIOACTIVE SEED IMPLANT/BRACHYTHERAPY IMPLANT (N/A Prostate) SPACE OAR INSTILLATION (N/A Rectum) CYSTOSCOPY FLEXIBLE (N/A Bladder)     Patient location during evaluation: PACU Anesthesia Type: General Level of consciousness: awake and alert Pain management: pain level controlled Vital Signs Assessment: post-procedure vital signs reviewed and stable Respiratory status: spontaneous breathing, nonlabored ventilation, respiratory function stable and patient connected to nasal cannula oxygen Cardiovascular status: blood pressure returned to baseline and stable Postop Assessment: no apparent nausea or vomiting Anesthetic complications: no    Last Vitals:  Vitals:   04/11/17 1015 04/11/17 1030  BP: (!) 129/53 (!) 127/58  Pulse: (!) 59 70  Resp: 13 11  Temp: 36.4 C (!) 36.2 C  SpO2: 98% 100%    Last Pain:  Vitals:   04/11/17 0651  TempSrc: Oral                 Elizebeth Kluesner

## 2017-04-11 NOTE — Transfer of Care (Signed)
Immediate Anesthesia Transfer of Care Note  Patient: Michael Maddox  Procedure(s) Performed: Procedure(s) (LRB): RADIOACTIVE SEED IMPLANT/BRACHYTHERAPY IMPLANT (N/A) SPACE OAR INSTILLATION (N/A) CYSTOSCOPY FLEXIBLE (N/A)  Patient Location: PACU  Anesthesia Type: General  Level of Consciousness: awake, oriented, sedated and patient cooperative  Airway & Oxygen Therapy: Patient Spontanous Breathing and Patient connected to face mask oxygen  Post-op Assessment: Report given to PACU RN and Post -op Vital signs reviewed and stable  Post vital signs: Reviewed and stable  Complications: No apparent anesthesia complications  Last Vitals:  Vitals:   04/11/17 0651 04/11/17 1015  BP: (!) 151/58 (!) 129/53  Pulse: (!) 59 (!) 59  Resp: 16 13  Temp: 37 C 36.4 C  SpO2: 100% 98%    Last Pain:  Vitals:   04/11/17 0651  TempSrc: Oral      Patients Stated Pain Goal: 6 (04/11/17 0734)

## 2017-04-13 NOTE — Progress Notes (Signed)
  Radiation Oncology         [phone] ________________________________  Name: Michael Maddox MRN: [id number]  Date: 04/13/2017  DOB: 1950/11/25       Prostate Seed Implant  ER:[id number], Christean Grief, MD  No ref. provider found  DIAGNOSIS: 66 y.o. gentleman with Stage T1c adenocarcinoma of the prostate with Gleason Score of 4+3, and PSA of 5.73    ICD-10-CM   1. Adenocarcinoma of prostate [facility]) [place] Discharge patient    PROCEDURE: Insertion of radioactive I-125 seeds into the prostate gland.  RADIATION DOSE: 145 Gy, definitive therapy.  TECHNIQUE: [name] was brought to the operating room with the urologist. He was placed in the dorsolithotomy position. He was catheterized and a rectal tube was inserted. The perineum was shaved, prepped and draped. The ultrasound probe was then introduced into the rectum to see the prostate gland.  TREATMENT DEVICE: A needle grid was attached to the ultrasound probe stand and anchor needles were placed.  3D PLANNING: The prostate was imaged in 3D using a sagittal sweep of the prostate probe. These images were transferred to the planning computer. There, the prostate, urethra and rectum were defined on each axial reconstructed image. Then, the software created an optimized 3D plan and a few seed positions were adjusted. The quality of the plan was reviewed using [facility] information for the target and the following two organs at risk:  Urethra and Rectum.  Then the accepted plan was uploaded to the seed Selectron afterloading unit.  PROSTATE VOLUME STUDY:  Using transrectal ultrasound the volume of the prostate was verified to be 40.44 cc.  SPECIAL TREATMENT PROCEDURE/SUPERVISION AND HANDLING: The Nucletron FIRST system was used to place the needles under sagittal guidance. A total of 20 needles were used to deposit 67 seeds in the prostate gland. The individual seed activity was 0.498 mCi.  SpaceOAR:  Yes  COMPLEX SIMULATION: At the end of the  procedure, an anterior radiograph of the pelvis was obtained to document seed positioning and count. Cystoscopy was performed to check the urethra and bladder.  MICRODOSIMETRY: At the end of the procedure, the patient was emitting 0.22 mR/hr at 1 meter. Accordingly, he was considered safe for hospital discharge.  PLAN: The patient will return to the radiation oncology clinic for post implant CT dosimetry in three weeks.   ________________________________  Sheral Apley Tammi Klippel, M.D.

## 2017-04-16 ENCOUNTER — Encounter (HOSPITAL_BASED_OUTPATIENT_CLINIC_OR_DEPARTMENT_OTHER): Payer: Self-pay | Admitting: Urology

## 2017-04-18 ENCOUNTER — Telehealth: Payer: Self-pay | Admitting: *Deleted

## 2017-04-18 NOTE — Telephone Encounter (Signed)
CALLED PATIENT TO INFORM OF MRI FOR 04-24-17 @ 7 PM, ARRIVAL TIME - 6:45 PM @ [facility], NO RESTRICTIONS TO TEST, SPOKE WITH PATIENT AND HE IS AWARE  OF THIS TEST

## 2017-04-23 ENCOUNTER — Telehealth: Payer: Self-pay | Admitting: *Deleted

## 2017-04-23 DIAGNOSIS — C61 Malignant neoplasm of prostate: Secondary | ICD-10-CM | POA: Diagnosis not present

## 2017-04-23 DIAGNOSIS — E1165 Type 2 diabetes mellitus with hyperglycemia: Secondary | ICD-10-CM | POA: Diagnosis not present

## 2017-04-23 DIAGNOSIS — I2510 Atherosclerotic heart disease of native coronary artery without angina pectoris: Secondary | ICD-10-CM | POA: Diagnosis not present

## 2017-04-23 DIAGNOSIS — I10 Essential (primary) hypertension: Secondary | ICD-10-CM | POA: Diagnosis not present

## 2017-04-23 NOTE — Telephone Encounter (Signed)
Called patient to remind of appts. for 04-24-17 and his MRI on 04-24-17 @ 7 pm @ [facility], spoke with patient and he is aware of these appts.

## 2017-04-24 ENCOUNTER — Ambulatory Visit
Admission: RE | Admit: 2017-04-24 | Discharge: 2017-04-24 | Disposition: A | Discharge status: Home / Self Care | Payer: Medicare Other | Source: Ambulatory Visit | Attending: Radiation Oncology | Admitting: Radiation Oncology

## 2017-04-24 ENCOUNTER — Telehealth: Payer: Self-pay | Admitting: *Deleted

## 2017-04-24 ENCOUNTER — Ambulatory Visit (HOSPITAL_COMMUNITY)
Admission: RE | Admit: 2017-04-24 | Discharge: 2017-04-24 | Disposition: A | Discharge status: Home / Self Care | Payer: Medicare Other | Source: Ambulatory Visit | Attending: Urology | Admitting: Urology

## 2017-04-24 ENCOUNTER — Other Ambulatory Visit: Payer: Self-pay

## 2017-04-24 ENCOUNTER — Encounter: Payer: Self-pay | Admitting: Radiation Oncology

## 2017-04-24 ENCOUNTER — Encounter: Payer: Self-pay | Admitting: Medical Oncology

## 2017-04-24 DIAGNOSIS — C61 Malignant neoplasm of prostate: Secondary | ICD-10-CM | POA: Diagnosis not present

## 2017-04-24 DIAGNOSIS — Z7982 Long term (current) use of aspirin: Secondary | ICD-10-CM | POA: Diagnosis not present

## 2017-04-24 DIAGNOSIS — K5909 Other constipation: Secondary | ICD-10-CM | POA: Diagnosis not present

## 2017-04-24 DIAGNOSIS — Y842 Radiological procedure and radiotherapy as the cause of abnormal reaction of the patient, or of later complication, without mention of misadventure at the time of the procedure: Secondary | ICD-10-CM | POA: Diagnosis not present

## 2017-04-24 DIAGNOSIS — Z79899 Other long term (current) drug therapy: Secondary | ICD-10-CM | POA: Insufficient documentation

## 2017-04-24 DIAGNOSIS — Z510 Encounter for antineoplastic radiation therapy: Secondary | ICD-10-CM | POA: Insufficient documentation

## 2017-04-24 NOTE — Progress Notes (Signed)
Radiation Oncology         [phone] ________________________________  Name: Michael Maddox MRN: [id number]  Date: 04/24/2017  DOB: Nov 08, 1950  Follow-Up Visit Note  CC: Nolene Ebbs, MD  Kathie Rhodes, MD  Diagnosis: 67 y.o. gentleman with Stage T1c adenocarcinoma of the prostate with Gleason Score of 4+3, and PSA of 5.73  Interval Since Last Radiation: 2 weeks; 04/11/17  Narrative:  The patient returns today for routine follow-up.  He is complaining of increased urinary frequency and urinary hesitation symptoms. He filled out a questionnaire regarding urinary function today providing and overall IPSS score of 25 characterizing his symptoms as severe.  His pre-implant score was 15. He reports hematuria this morning after straining to have a bowel movement. He denies dysuria, urinary leakage or incontinence. He reports urinary urgency, intermittency with weak stream and feelings of incomplete emptying on occasion. He is only getting up once at night to void. He feels that his symptoms are gradually improving. He denies abdominal pain or diarrhea but reports continuing to struggle with chronic constipation. He reports a healthy appetite and is maintaining his weight. He has not noticed any significant affect on his energy level.  ALLERGIES:  has No Known Allergies.  Meds: Current Outpatient Medications  Medication Sig Dispense Refill  . aspirin 81 MG chewable tablet Chew 1 tablet (81 mg total) by mouth daily.    . clopidogrel (PLAVIX) 75 MG tablet Take 75 mg by mouth every evening.     Marland Kitchen dexlansoprazole (DEXILANT) 60 MG capsule Take 60 mg by mouth daily as needed.    . Flaxseed, Linseed, (FLAX SEED OIL) 1000 MG CAPS Take 2,000 mg by mouth daily.     Marland Kitchen HYDROcodone-acetaminophen (NORCO) 10-325 MG tablet Take 1-2 tablets by mouth every 4 (four) hours as needed for moderate pain. Maximum dose per 24 hours - 8 pills 12 tablet 0  . hydrocortisone 2.5 % ointment Apply topically 2 (two) times  daily.  0  . metFORMIN (GLUCOPHAGE) 500 MG tablet Take 500 mg by mouth daily.  2  . metoprolol succinate (TOPROL-XL) 25 MG 24 hr tablet Take 1 tablet by mouth every evening.     . Multiple Vitamin (MULTIVITAMIN WITH MINERALS) TABS tablet Take 1 tablet by mouth daily.    . simvastatin (ZOCOR) 20 MG tablet Take 1 tablet by mouth at bedtime.     . vitamin C (ASCORBIC ACID) 500 MG tablet Take 500 mg by mouth daily.     No current facility-administered medications for this encounter.     Physical Findings: The patient is in no acute distress. Patient is alert and oriented.  weight is 147 lb 6.4 oz (66.9 kg). His oral temperature is 98.3 F (36.8 C). His blood pressure is 106/70 and his pulse is 67. His respiration is 16 and oxygen saturation is 100%. .  No significant changes. In general this is a well appearing gentleman in no acute distress. He's alert and oriented x4 and appropriate throughout the examination. Cardiopulmonary assessment is negative for acute distress and he exhibits normal effort.    Lab Findings: Lab Results  Component Value Date   WBC 4.4 04/04/2017   HGB 12.9 (L) 04/04/2017   HCT 39.6 04/04/2017   MCV 90.4 04/04/2017   PLT 198 04/04/2017    Radiographic Findings:  Patient underwent CT imaging in our clinic for post implant dosimetry. The CT was personally reviewed by Dr. Tammi Klippel and appears to demonstrate an adequate distribution of radioactive seeds throughout  the prostate gland. The seeds do appear to be more abundant at the base of the prostate with much fewer covering the apex. There are no seeds in or near the rectum. He is scheduled for a prostate MRI this evening at 7 PM and these images will be fused with the CT scan for further assessment. We suspect the final radiation plan and dosimetry will show appropriate coverage of the prostate gland but did discuss with the patient the possibility that he might need a repeat trip to the OR for placement of additional  seeds to ensure adequate coverage of the prostate apex particularly given the fact that he has known Gleason 4+3 disease in the right apex and right apex lateral.   Impression: The patient is recovering from the effects of radiation. His urinary symptoms should gradually improve over the next 4-6 months. We talked about this today. He is encouraged by his improvement already and is otherwise pleased with his outcome.   Plan: Today, I spent time talking to the patient about his prostate seed implant and resolving urinary symptoms. We did discuss the potential possibility that he may require additional seed placement to ensure adequate coverage of the prostate apex, particularly given the fact that he has known Gleason 4+3 disease in the right apex and right apex lateral.  This will be determined based on his final radiation plan and dosimetry report. We also talked about long-term follow-up for prostate cancer following seed implant. He understands that ongoing PSA determinations and digital rectal exams will help perform surveillance to rule out disease recurrence. He understands what to expect with his PSA measures. He has a scheduled follow-up with Dr. Karsten Ro on 05/02/2017. Patient was also educated today about some of the long-term effects from radiation including a small risk for rectal bleeding and possibly erectile dysfunction. We talked about some of the general management approaches to these potential complications. However, I did encourage the patient to contact our office or return at any point if he has questions or concerns related to his previous radiation and prostate cancer.    Nicholos Johns, PA-C    Tyler Pita, MD  [facility] Direct Dial: [phone]  Fax: [phone] [email or web address]  Skype  LinkedIn    Page Me

## 2017-04-24 NOTE — Progress Notes (Signed)
Michael Maddox states seed implant went well. His main issue is urinary urgency but states this is slowly improving. He has follow up appointment with Dr. Karsten Ro 05/02/17 but needs to reschedule due to death of his father.  I asked him to call me with questions and or concerns.

## 2017-04-24 NOTE — Telephone Encounter (Signed)
CALLED PATIENT TO INFORM OF NEW FU DATE FOR DR. Karsten Ro ON 05-12-17 - ARRIVAL TIME - 8:15 AM, SPOKE WITH PATIENT AND HE IS AWARE OF THIS APPT.

## 2017-04-24 NOTE — Progress Notes (Signed)
Patient returns today for post seed follow up. Pre seed IPSS 15. Post seed IPSS 25. Denies dysuria. Reports hematuria this morning after he had to strain to have a bowel movement. Denies urinary leakage or incontinence. Reports urinary urgency. Scheduled to follow up with Dr. Karsten Ro 05/02/2017 but has to reschedule for next week or after the 18th because he has to travel out of state to bury his father. Scheduled for MRI tonight at 7pm to confirm spaceoar placement.   BP 106/70 (BP Location: Right Arm, Patient Position: Sitting, Cuff Size: Normal)   Pulse 67   Temp 98.3 F (36.8 C) (Oral)   Resp 16   Wt 147 lb 6.4 oz (66.9 kg)   SpO2 100%   BMI 20.56 kg/m  Wt Readings from Last 3 Encounters:  04/24/17 147 lb 6.4 oz (66.9 kg)  04/11/17 150 lb 11.2 oz (68.4 kg)  03/11/17 154 lb (69.9 kg)

## 2017-04-24 NOTE — Progress Notes (Signed)
  Radiation Oncology         [phone] ________________________________  Name: Michael Maddox MRN: [id number]  Date: 04/24/2017  DOB: Dec 28, 1950  COMPLEX SIMULATION NOTE  NARRATIVE:  The patient was brought to the [place] suite today following prostate seed implantation approximately one month ago.  Identity was confirmed.  All relevant records and images related to the planned course of therapy were reviewed.  Then, the patient was set-up supine.  CT images were obtained.  The CT images were loaded into the planning software.  Then the prostate and rectum were contoured.  Treatment planning then occurred.  The implanted iodine 125 seeds were identified by the physics staff for projection of radiation distribution  I have requested : 3D Simulation  I have requested a DVH of the following structures: Prostate and rectum.    ________________________________  Sheral Apley Tammi Klippel, M.D.  This document serves as a record of services personally performed by Tyler Pita, MD. It was created on his behalf by Bethann Humble, a trained medical scribe. The creation of this record is based on the scribe's personal observations and the provider's statements to them. This document has been checked and approved by the attending provider.

## 2017-05-02 ENCOUNTER — Telehealth: Payer: Self-pay | Admitting: Cardiovascular Disease

## 2017-05-02 NOTE — Telephone Encounter (Signed)
Spoke to pt to inform him of procedure time change on 05/08/17. Time changed to 9:30 am to arrive at [facility] hospital at 7:30 am.   Pt verbalized understanding.

## 2017-05-05 ENCOUNTER — Encounter: Payer: Self-pay | Admitting: Radiation Oncology

## 2017-05-05 DIAGNOSIS — Z510 Encounter for antineoplastic radiation therapy: Secondary | ICD-10-CM | POA: Diagnosis not present

## 2017-05-05 DIAGNOSIS — C61 Malignant neoplasm of prostate: Secondary | ICD-10-CM | POA: Diagnosis not present

## 2017-05-06 NOTE — Progress Notes (Signed)
  Radiation Oncology         [phone] ________________________________  Name: DONNELLE OLMEDA MRN: [id number]  Date: 05/05/2017  DOB: [birth year]  3D Planning Note   Prostate Brachytherapy Post-Implant Dosimetry  Diagnosis: 67 y.o. gentleman with Stage T1c adenocarcinoma of the prostate with Gleason Score of 4+3, and PSA of 5.73  Narrative: On a previous date, WOODRUFF SKIRVIN returned following prostate seed implantation for post implant planning. He underwent CT scan complex simulation to delineate the three-dimensional structures of the pelvis and demonstrate the radiation distribution.  Since that time, the seed localization, and complex isodose planning with dose volume histograms have now been completed.  Results:   Prostate Coverage - The dose of radiation delivered to the 90% or more of the prostate gland (D90) was 106.23% of the prescription dose. This exceeds our goal of greater than 90%. Rectal Sparing - The volume of rectal tissue receiving the prescription dose or higher was 0.0 cc. This falls under our thresholds tolerance of 1.0 cc.  Impression: The prostate seed implant appears to show adequate target coverage and appropriate rectal sparing.  Plan:  The patient will continue to follow with urology for ongoing PSA determinations. I would anticipate a high likelihood for local tumor control with minimal risk for rectal morbidity.  ________________________________  Sheral Apley Tammi Klippel, M.D.

## 2017-05-07 ENCOUNTER — Other Ambulatory Visit: Payer: Self-pay | Admitting: Cardiovascular Disease

## 2017-05-07 ENCOUNTER — Encounter: Payer: Self-pay | Admitting: Cardiovascular Disease

## 2017-05-07 ENCOUNTER — Ambulatory Visit: Payer: Medicare Other | Admitting: Cardiovascular Disease

## 2017-05-07 DIAGNOSIS — I739 Peripheral vascular disease, unspecified: Secondary | ICD-10-CM | POA: Diagnosis not present

## 2017-05-07 NOTE — Assessment & Plan Note (Signed)
History of peripheral arterial disease status post left iliac and bilateral SFA intervention by myself back in 2005. He said bilateral looks; claudication with Doppler exertion occluded SFA stents. He scheduled for angiography and potential endovascular therapy for lifestyle limiting claudication tomorrow.

## 2017-05-07 NOTE — Progress Notes (Signed)
05/07/2017 Michael Maddox   21-Nov-1950  [id number]  Primary Physician Nolene Ebbs, MD Primary Cardiologist: Lorretta Harp MD [name], [place], [place]  HPI:  Michael Maddox is a 67 y.o.   father of 3 children, grandfather of 3 grandchildren who is a patient of Dr. Christean Grief Avebuere's he has a history of peripheral arterial disease. I last saw him in the office 02/06/17. He is status post left iliac and bilateral SFA intervention most recently in 2005 by myself. He discontinued tobacco abuse at that time. He also has 2 hypertension and hyperlipidemia. He had right carotid endarterectomy performed by Dr. [name] in November 2010 for high-grade disease in the setting of a stroke. He denies chest pain but does get somewhat shortness of breath which has not changed. His last functional study was performed 12 years ago and was unremarkable. The palpitations are fairly recent onset. He can "hear his heartbeat in his ears" and does feel somewhat dizzy/presyncopal. A Doppler study of his lower extremities revealed severe arterial insufficiency with a right ABI 0.25 the left of 0.39. He had severe iliac and SFA disease. Carotid Dopplers were performed as well which revealed moderate right carotid disease and moderate to severe left. His Myoview stress test that showed inferoseptal ischemia thought to be "high risk". He underwent cardiac catheterization by myself 08/16/13 revealing three-vessel disease. 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 Merilynn Finland 08/23/13 with a free LIMA to his LAD, vein to an obtuse marginal branch and PDA. I see him back for over 3 years. He does complain of less tolerating claudication but denies chest pain or shortness of breath. Importantly, he has recently been diagnosed with prostate cancer and is being scheduled for radioactive seed implantation. He had prostate cancer with  seed implantation performed in December. He scheduled for peripheral angiography and potential endovascular therapy for lifestyle limiting claudication because of Doppler evidence of occluded SFA stents bilaterally.   Current Meds  Medication Sig  . aspirin 81 MG chewable tablet Chew 1 tablet (81 mg total) by mouth daily. (Patient taking differently: Chew 81 mg by mouth at bedtime. )  . clopidogrel (PLAVIX) 75 MG tablet Take 75 mg by mouth at bedtime.   Marland Kitchen dexlansoprazole (DEXILANT) 60 MG capsule Take 60 mg by mouth daily as needed (for reflux.).   Marland Kitchen HYDROcodone-acetaminophen (NORCO) 10-325 MG tablet Take 1-2 tablets by mouth every 4 (four) hours as needed for moderate pain. Maximum dose per 24 hours - 8 pills  . hydrocortisone 2.5 % ointment Apply 1 application topically 2 (two) times daily as needed (for skin irritation).   . metFORMIN (GLUCOPHAGE) 500 MG tablet Take 500 mg by mouth at bedtime.   . metoprolol succinate (TOPROL-XL) 25 MG 24 hr tablet Take 25 mg by mouth at bedtime.   . Multiple Vitamin (MULTIVITAMIN WITH MINERALS) TABS tablet Take 1 tablet by mouth once a week.   . simvastatin (ZOCOR) 20 MG tablet Take 20 mg by mouth at bedtime.      No Known Allergies  Social History   Socioeconomic History  . Marital status: Married    Spouse name: Not on file  . Number of children: Not on file  . Years of education: Not on file  . Highest education level: Not on file  Social Needs  . Financial resource strain: Not on file  . Food insecurity - worry: Not  on file  . Food insecurity - inability: Not on file  . Transportation needs - medical: Not on file  . Transportation needs - non-medical: Not on file  Occupational History  . Not on file  Tobacco Use  . Smoking status: Former Smoker    Packs/day: 1.00    Years: 35.00    Pack years: 35.00    Types: Cigarettes    Last attempt to quit: 05/04/2013    Years since quitting: 4.0  . Smokeless tobacco: Never Used  Substance and  Sexual Activity  . Alcohol use: Yes    Alcohol/week: 2.5 oz    Types: 5 Standard drinks or equivalent per week  . Drug use: No  . Sexual activity: Not on file  Other Topics Concern  . Not on file  Social History Narrative  . Not on file     Review of Systems: General: negative for chills, fever, night sweats or weight changes.  Cardiovascular: negative for chest pain, dyspnea on exertion, edema, orthopnea, palpitations, paroxysmal nocturnal dyspnea or shortness of breath Dermatological: negative for rash Respiratory: negative for cough or wheezing Urologic: negative for hematuria Abdominal: negative for nausea, vomiting, diarrhea, bright red blood per rectum, melena, or hematemesis Neurologic: negative for visual changes, syncope, or dizziness All other systems reviewed and are otherwise negative except as noted above.    Blood pressure 132/74, pulse (!) 56, height 5\' 11"  (1.803 m), weight 149 lb (67.6 kg).  General appearance: alert and no distress Neck: no adenopathy, no JVD, supple, symmetrical, trachea midline, thyroid not enlarged, symmetric, no tenderness/mass/nodules and Bilateral carotid bruits left lateral thoracic Lungs: clear to auscultation bilaterally Heart: regular rate and rhythm, S1, S2 normal, no murmur, click, rub or gallop Extremities: extremities normal, atraumatic, no cyanosis or edema Pulses: Diminished pedal pulses bilaterally Skin: Skin color, texture, turgor normal. No rashes or lesions Neurologic: Alert and oriented X 3, normal strength and tone. Normal symmetric reflexes. Normal coordination and gait  EKG not performed today  ASSESSMENT AND PLAN:   Peripheral arterial disease ([place]) History of peripheral arterial disease status post left iliac and bilateral SFA intervention by myself back in 2005. He said bilateral looks; claudication with Doppler exertion occluded SFA stents. He scheduled for angiography and potential endovascular therapy for  lifestyle limiting claudication tomorrow.      Lorretta Harp MD FACP,FACC,FAHA, [facility] 05/07/2017 10:14 AM

## 2017-05-07 NOTE — H&P (View-Only) (Signed)
05/07/2017 Michael Maddox   [birth year]  [id number]  Primary Physician Nolene Ebbs, MD Primary Cardiologist: Lorretta Harp MD [name], [place], [place]  HPI:  Michael Maddox is a 67 y.o.   father of 3 children, grandfather of 3 grandchildren who is a patient of Dr. Christean Grief Avebuere's he has a history of peripheral arterial disease. I last saw him in the office 02/06/17. He is status post left iliac and bilateral SFA intervention most recently in 2005 by myself. He discontinued tobacco abuse at that time. He also has 2 hypertension and hyperlipidemia. He had right carotid endarterectomy performed by Dr. [name] in November 2010 for high-grade disease in the setting of a stroke. He denies chest pain but does get somewhat shortness of breath which has not changed. His last functional study was performed 12 years ago and was unremarkable. The palpitations are fairly recent onset. He can "hear his heartbeat in his ears" and does feel somewhat dizzy/presyncopal. A Doppler study of his lower extremities revealed severe arterial insufficiency with a right ABI 0.25 the left of 0.39. He had severe iliac and SFA disease. Carotid Dopplers were performed as well which revealed moderate right carotid disease and moderate to severe left. His Myoview stress test that showed inferoseptal ischemia thought to be "high risk". He underwent cardiac catheterization by myself 08/16/13 revealing three-vessel disease. 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 Merilynn Finland 08/23/13 with a free LIMA to his LAD, vein to an obtuse marginal branch and PDA. I see him back for over 3 years. He does complain of less tolerating claudication but denies chest pain or shortness of breath. Importantly, he has recently been diagnosed with prostate cancer and is being scheduled for radioactive seed implantation. He had prostate cancer with  seed implantation performed in December. He scheduled for peripheral angiography and potential endovascular therapy for lifestyle limiting claudication because of Doppler evidence of occluded SFA stents bilaterally.   Current Meds  Medication Sig  . aspirin 81 MG chewable tablet Chew 1 tablet (81 mg total) by mouth daily. (Patient taking differently: Chew 81 mg by mouth at bedtime. )  . clopidogrel (PLAVIX) 75 MG tablet Take 75 mg by mouth at bedtime.   Marland Kitchen dexlansoprazole (DEXILANT) 60 MG capsule Take 60 mg by mouth daily as needed (for reflux.).   Marland Kitchen HYDROcodone-acetaminophen (NORCO) 10-325 MG tablet Take 1-2 tablets by mouth every 4 (four) hours as needed for moderate pain. Maximum dose per 24 hours - 8 pills  . hydrocortisone 2.5 % ointment Apply 1 application topically 2 (two) times daily as needed (for skin irritation).   . metFORMIN (GLUCOPHAGE) 500 MG tablet Take 500 mg by mouth at bedtime.   . metoprolol succinate (TOPROL-XL) 25 MG 24 hr tablet Take 25 mg by mouth at bedtime.   . Multiple Vitamin (MULTIVITAMIN WITH MINERALS) TABS tablet Take 1 tablet by mouth once a week.   . simvastatin (ZOCOR) 20 MG tablet Take 20 mg by mouth at bedtime.      No Known Allergies  Social History   Socioeconomic History  . Marital status: Married    Spouse name: Not on file  . Number of children: Not on file  . Years of education: Not on file  . Highest education level: Not on file  Social Needs  . Financial resource strain: Not on file  . Food insecurity - worry: Not  on file  . Food insecurity - inability: Not on file  . Transportation needs - medical: Not on file  . Transportation needs - non-medical: Not on file  Occupational History  . Not on file  Tobacco Use  . Smoking status: Former Smoker    Packs/day: 1.00    Years: 35.00    Pack years: 35.00    Types: Cigarettes    Last attempt to quit: 05/04/2013    Years since quitting: 4.0  . Smokeless tobacco: Never Used  Substance and  Sexual Activity  . Alcohol use: Yes    Alcohol/week: 2.5 oz    Types: 5 Standard drinks or equivalent per week  . Drug use: No  . Sexual activity: Not on file  Other Topics Concern  . Not on file  Social History Narrative  . Not on file     Review of Systems: General: negative for chills, fever, night sweats or weight changes.  Cardiovascular: negative for chest pain, dyspnea on exertion, edema, orthopnea, palpitations, paroxysmal nocturnal dyspnea or shortness of breath Dermatological: negative for rash Respiratory: negative for cough or wheezing Urologic: negative for hematuria Abdominal: negative for nausea, vomiting, diarrhea, bright red blood per rectum, melena, or hematemesis Neurologic: negative for visual changes, syncope, or dizziness All other systems reviewed and are otherwise negative except as noted above.    Blood pressure 132/74, pulse (!) 56, height 5\' 11"  (1.803 m), weight 149 lb (67.6 kg).  General appearance: alert and no distress Neck: no adenopathy, no JVD, supple, symmetrical, trachea midline, thyroid not enlarged, symmetric, no tenderness/mass/nodules and Bilateral carotid bruits left lateral thoracic Lungs: clear to auscultation bilaterally Heart: regular rate and rhythm, S1, S2 normal, no murmur, click, rub or gallop Extremities: extremities normal, atraumatic, no cyanosis or edema Pulses: Diminished pedal pulses bilaterally Skin: Skin color, texture, turgor normal. No rashes or lesions Neurologic: Alert and oriented X 3, normal strength and tone. Normal symmetric reflexes. Normal coordination and gait  EKG not performed today  ASSESSMENT AND PLAN:   Peripheral arterial disease ([place]) History of peripheral arterial disease status post left iliac and bilateral SFA intervention by myself back in 2005. He said bilateral looks; claudication with Doppler exertion occluded SFA stents. He scheduled for angiography and potential endovascular therapy for  lifestyle limiting claudication tomorrow.      Lorretta Harp MD FACP,FACC,FAHA, [facility] 05/07/2017 10:14 AM

## 2017-05-07 NOTE — Patient Instructions (Signed)
Medication Instructions: Your physician recommends that you continue on your current medications as directed. Please refer to the Current Medication list given to you today.   Follow-Up: You procedure time for tomorrow has changed to 10:30 AM. Please arrive at [facility] by 8:30 tomorrow morning.

## 2017-05-08 ENCOUNTER — Ambulatory Visit (HOSPITAL_COMMUNITY)
Admission: RE | Admit: 2017-05-08 | Discharge: 2017-05-09 | Disposition: A | Discharge status: Home / Self Care | Payer: Medicare Other | Source: Ambulatory Visit | Attending: Cardiovascular Disease | Admitting: Cardiovascular Disease

## 2017-05-08 ENCOUNTER — Encounter (HOSPITAL_COMMUNITY)
Admission: RE | Disposition: A | Discharge status: Home / Self Care | Payer: Self-pay | Source: Ambulatory Visit | Attending: Cardiovascular Disease

## 2017-05-08 ENCOUNTER — Other Ambulatory Visit: Payer: Self-pay

## 2017-05-08 ENCOUNTER — Encounter (HOSPITAL_COMMUNITY): Payer: Self-pay | Admitting: Cardiovascular Disease

## 2017-05-08 DIAGNOSIS — Y831 Surgical operation with implant of artificial internal device as the cause of abnormal reaction of the patient, or of later complication, without mention of misadventure at the time of the procedure: Secondary | ICD-10-CM | POA: Insufficient documentation

## 2017-05-08 DIAGNOSIS — I739 Peripheral vascular disease, unspecified: Secondary | ICD-10-CM | POA: Diagnosis present

## 2017-05-08 DIAGNOSIS — I700 Atherosclerosis of aorta: Secondary | ICD-10-CM | POA: Diagnosis not present

## 2017-05-08 DIAGNOSIS — Z951 Presence of aortocoronary bypass graft: Secondary | ICD-10-CM | POA: Insufficient documentation

## 2017-05-08 DIAGNOSIS — Z7984 Long term (current) use of oral hypoglycemic drugs: Secondary | ICD-10-CM | POA: Diagnosis not present

## 2017-05-08 DIAGNOSIS — Z87891 Personal history of nicotine dependence: Secondary | ICD-10-CM | POA: Diagnosis not present

## 2017-05-08 DIAGNOSIS — C61 Malignant neoplasm of prostate: Secondary | ICD-10-CM | POA: Diagnosis not present

## 2017-05-08 DIAGNOSIS — Z7982 Long term (current) use of aspirin: Secondary | ICD-10-CM | POA: Diagnosis not present

## 2017-05-08 DIAGNOSIS — I10 Essential (primary) hypertension: Secondary | ICD-10-CM | POA: Insufficient documentation

## 2017-05-08 DIAGNOSIS — E785 Hyperlipidemia, unspecified: Secondary | ICD-10-CM | POA: Insufficient documentation

## 2017-05-08 DIAGNOSIS — Z95820 Peripheral vascular angioplasty status with implants and grafts: Secondary | ICD-10-CM | POA: Insufficient documentation

## 2017-05-08 DIAGNOSIS — Z8673 Personal history of transient ischemic attack (TIA), and cerebral infarction without residual deficits: Secondary | ICD-10-CM | POA: Diagnosis not present

## 2017-05-08 DIAGNOSIS — T82898A Other specified complication of vascular prosthetic devices, implants and grafts, initial encounter: Secondary | ICD-10-CM | POA: Diagnosis not present

## 2017-05-08 DIAGNOSIS — I2510 Atherosclerotic heart disease of native coronary artery without angina pectoris: Secondary | ICD-10-CM | POA: Insufficient documentation

## 2017-05-08 DIAGNOSIS — I70212 Atherosclerosis of native arteries of extremities with intermittent claudication, left leg: Secondary | ICD-10-CM | POA: Insufficient documentation

## 2017-05-08 DIAGNOSIS — Z7902 Long term (current) use of antithrombotics/antiplatelets: Secondary | ICD-10-CM | POA: Diagnosis not present

## 2017-05-08 DIAGNOSIS — Z79899 Other long term (current) drug therapy: Secondary | ICD-10-CM | POA: Diagnosis not present

## 2017-05-08 HISTORY — PX: LOWER EXTREMITY ANGIOGRAPHY: CATH118251

## 2017-05-08 HISTORY — PX: ABDOMINAL AORTOGRAM: CATH118222

## 2017-05-08 HISTORY — DX: Type 2 diabetes mellitus without complications: E11.9

## 2017-05-08 HISTORY — DX: Cerebral infarction, unspecified: I63.9

## 2017-05-08 HISTORY — PX: PERIPHERAL VASCULAR BALLOON ANGIOPLASTY: CATH118281

## 2017-05-08 LAB — GLUCOSE, CAPILLARY
GLUCOSE-CAPILLARY: 113 mg/dL — AB (ref 65–99)
GLUCOSE-CAPILLARY: 95 mg/dL (ref 65–99)
Glucose-Capillary: 91 mg/dL (ref 65–99)
Glucose-Capillary: 154 mg/dL — ABNORMAL HIGH (ref 65–99)

## 2017-05-08 LAB — PROTIME-INR
INR: 0.96
Prothrombin Time: 12.7 seconds (ref 11.4–15.2)

## 2017-05-08 LAB — CBC
HCT: 39.1 % (ref 39.0–52.0)
HEMOGLOBIN: 12.6 g/dL — AB (ref 13.0–17.0)
MCH: 29.5 pg (ref 26.0–34.0)
MCHC: 32.2 g/dL (ref 30.0–36.0)
MCV: 91.6 fL (ref 78.0–100.0)
Platelets: 212 10*3/uL (ref 150–400)
RBC: 4.27 MIL/uL (ref 4.22–5.81)
RDW: 12.6 % (ref 11.5–15.5)
WBC: 3.8 10*3/uL — AB (ref 4.0–10.5)

## 2017-05-08 LAB — BASIC METABOLIC PANEL
ANION GAP: 9 (ref 5–15)
BUN: 10 mg/dL (ref 6–20)
CALCIUM: 8.8 mg/dL — AB (ref 8.9–10.3)
CO2: 25 mmol/L (ref 22–32)
CREATININE: 0.83 mg/dL (ref 0.61–1.24)
Chloride: 108 mmol/L (ref 101–111)
GFR calc non Af Amer: >60 mL/min (ref >60)
Glucose, Bld: 128 mg/dL — ABNORMAL HIGH (ref 65–99)
Potassium: 5 mmol/L (ref 3.5–5.1)
SODIUM: 142 mmol/L (ref 135–145)

## 2017-05-08 LAB — POCT ACTIVATED CLOTTING TIME
ACTIVATED CLOTTING TIME: 252 s
ACTIVATED CLOTTING TIME: 191 s
ACTIVATED CLOTTING TIME: 175 s
Activated Clotting Time: 191 seconds

## 2017-05-08 SURGERY — ABDOMINAL AORTOGRAM
Anesthesia: LOCAL

## 2017-05-08 MED ORDER — CLOPIDOGREL BISULFATE 75 MG PO TABS: 75.0000 mg | ORAL_TABLET | Freq: Every day | ORAL | Status: DC

## 2017-05-08 MED ORDER — LIDOCAINE HCL (PF) 1 % IJ SOLN
INTRAMUSCULAR | Status: AC
  Filled 2017-05-08: qty 30

## 2017-05-08 MED ORDER — LIDOCAINE HCL (PF) 1 % IJ SOLN
INTRAMUSCULAR | Status: DC | PRN
  Administered 2017-05-08: 28 mL

## 2017-05-08 MED ORDER — ASPIRIN 81 MG PO CHEW: 81.0000 mg | CHEWABLE_TABLET | Freq: Every day | ORAL | Status: DC

## 2017-05-08 MED ORDER — MIDAZOLAM HCL 2 MG/2ML IJ SOLN
INTRAMUSCULAR | Status: AC
  Filled 2017-05-08: qty 2

## 2017-05-08 MED ORDER — SODIUM CHLORIDE 0.9 % WEIGHT BASED INFUSION
3.0000 mL/kg/h | INTRAVENOUS | Status: DC
  Administered 2017-05-08: 3 mL/kg/h via INTRAVENOUS

## 2017-05-08 MED ORDER — HEPARIN SODIUM (PORCINE) 1000 UNIT/ML IJ SOLN
INTRAMUSCULAR | Status: DC | PRN
  Administered 2017-05-08: 7500 [IU] via INTRAVENOUS

## 2017-05-08 MED ORDER — MIDAZOLAM HCL 2 MG/2ML IJ SOLN
INTRAMUSCULAR | Status: DC | PRN
  Administered 2017-05-08: 1 mg via INTRAVENOUS

## 2017-05-08 MED ORDER — PANTOPRAZOLE SODIUM 40 MG PO TBEC
40.0000 mg | DELAYED_RELEASE_TABLET | Freq: Every day | ORAL | Status: DC
  Administered 2017-05-08: 21:00:00 40 mg via ORAL
  Filled 2017-05-08: qty 1

## 2017-05-08 MED ORDER — METOPROLOL SUCCINATE ER 25 MG PO TB24
25.0000 mg | ORAL_TABLET | Freq: Every day | ORAL | Status: DC
  Administered 2017-05-08: 25 mg via ORAL
  Filled 2017-05-08: qty 1

## 2017-05-08 MED ORDER — HEPARIN (PORCINE) IN NACL 2-0.9 UNIT/ML-% IJ SOLN
INTRAMUSCULAR | Status: AC | PRN
  Administered 2017-05-08: 1000 mL

## 2017-05-08 MED ORDER — SIMVASTATIN 20 MG PO TABS: 20.0000 mg | ORAL_TABLET | Freq: Every day | ORAL | Status: DC

## 2017-05-08 MED ORDER — SODIUM CHLORIDE 0.9 % IV SOLN: 250.0000 mL | INTRAVENOUS | Status: DC | PRN

## 2017-05-08 MED ORDER — MORPHINE SULFATE (PF) 10 MG/ML IV SOLN: 2.0000 mg | INTRAVENOUS | Status: DC | PRN

## 2017-05-08 MED ORDER — HEPARIN (PORCINE) IN NACL 2-0.9 UNIT/ML-% IJ SOLN
INTRAMUSCULAR | Status: AC
  Filled 2017-05-08: qty 1000

## 2017-05-08 MED ORDER — IODIXANOL 320 MG/ML IV SOLN
INTRAVENOUS | Status: DC | PRN
  Administered 2017-05-08: 155 mL via INTRAVENOUS

## 2017-05-08 MED ORDER — HYDRALAZINE HCL 20 MG/ML IJ SOLN: 5.0000 mg | INTRAMUSCULAR | Status: DC | PRN

## 2017-05-08 MED ORDER — ONDANSETRON HCL 4 MG/2ML IJ SOLN
4.0000 mg | Freq: Four times a day (QID) | INTRAMUSCULAR | Status: DC | PRN
  Administered 2017-05-08: 17:00:00 4 mg via INTRAVENOUS
  Filled 2017-05-08: qty 2

## 2017-05-08 MED ORDER — SODIUM CHLORIDE 0.9% FLUSH: 3.0000 mL | INTRAVENOUS | Status: DC | PRN

## 2017-05-08 MED ORDER — SODIUM CHLORIDE 0.9% FLUSH: 3.0000 mL | Freq: Two times a day (BID) | INTRAVENOUS | Status: DC

## 2017-05-08 MED ORDER — SIMVASTATIN 40 MG PO TABS
40.0000 mg | ORAL_TABLET | Freq: Every day | ORAL | Status: DC
  Filled 2017-05-08: qty 1

## 2017-05-08 MED ORDER — HYDROCODONE-ACETAMINOPHEN 5-325 MG PO TABS
1.0000 | ORAL_TABLET | Freq: Four times a day (QID) | ORAL | Status: DC | PRN
  Administered 2017-05-08 (×2): 2 via ORAL
  Filled 2017-05-08 (×2): qty 2

## 2017-05-08 MED ORDER — ASPIRIN EC 81 MG PO TBEC: 81.0000 mg | DELAYED_RELEASE_TABLET | Freq: Every day | ORAL | Status: DC

## 2017-05-08 MED ORDER — HEPARIN SODIUM (PORCINE) 1000 UNIT/ML IJ SOLN
INTRAMUSCULAR | Status: AC
  Filled 2017-05-08: qty 1

## 2017-05-08 MED ORDER — ENSURE ENLIVE PO LIQD
237.0000 mL | Freq: Two times a day (BID) | ORAL | Status: DC
  Filled 2017-05-08 (×5): qty 237

## 2017-05-08 MED ORDER — FENTANYL CITRATE (PF) 100 MCG/2ML IJ SOLN
INTRAMUSCULAR | Status: DC | PRN
  Administered 2017-05-08: 25 ug via INTRAVENOUS

## 2017-05-08 MED ORDER — ASPIRIN 81 MG PO CHEW: 81.0000 mg | CHEWABLE_TABLET | ORAL | Status: DC

## 2017-05-08 MED ORDER — SIMVASTATIN 20 MG PO TABS: 40.0000 mg | ORAL_TABLET | Freq: Every day | ORAL | Status: DC

## 2017-05-08 MED ORDER — LABETALOL HCL 5 MG/ML IV SOLN: 10.0000 mg | INTRAVENOUS | Status: DC | PRN

## 2017-05-08 MED ORDER — SODIUM CHLORIDE 0.9 % IV SOLN
INTRAVENOUS | Status: AC
  Administered 2017-05-08: 21:00:00 via INTRAVENOUS

## 2017-05-08 MED ORDER — ACETAMINOPHEN 325 MG PO TABS: 650.0000 mg | ORAL_TABLET | ORAL | Status: DC | PRN

## 2017-05-08 MED ORDER — FENTANYL CITRATE (PF) 100 MCG/2ML IJ SOLN
INTRAMUSCULAR | Status: AC
  Filled 2017-05-08: qty 2

## 2017-05-08 MED ORDER — SODIUM CHLORIDE 0.9 % WEIGHT BASED INFUSION: 1.0000 mL/kg/h | INTRAVENOUS | Status: DC

## 2017-05-08 SURGICAL SUPPLY — 19 items
BALLN MUSTANG 4X80X75 (BALLOONS) ×4
BALLOON MUSTANG 4X80X75 (BALLOONS) IMPLANT
CATH ANGIO 5F PIGTAIL 65CM (CATHETERS) ×1 IMPLANT
DEVICE CONTINUOUS FLUSH (MISCELLANEOUS) ×3 IMPLANT
KIT ENCORE 26 ADVANTAGE (KITS) ×2 IMPLANT
KIT MICROINTRODUCER STIFF 5F (SHEATH) IMPLANT
KIT PV (KITS) ×4 IMPLANT
NDL SMART REG 18GX2-3/4 (NEEDLE) IMPLANT
NEEDLE SMART REG 18GX2-3/4 (NEEDLE) ×4 IMPLANT
SHEATH PINNACLE 5F 10CM (SHEATH) ×2 IMPLANT
SHEATH PINNACLE 6F 10CM (SHEATH) ×2 IMPLANT
STOPCOCK MORSE 400PSI 3WAY (MISCELLANEOUS) ×2 IMPLANT
SYRINGE MEDRAD AVANTA MACH 7 (SYRINGE) ×2 IMPLANT
TAPE VIPERTRACK RADIOPAQ (MISCELLANEOUS) ×2 IMPLANT
TAPE VIPERTRACK RADIOPAQUE (MISCELLANEOUS) ×4
TRANSDUCER W/STOPCOCK (MISCELLANEOUS) ×4 IMPLANT
TRAY PV CATH (CUSTOM PROCEDURE TRAY) ×4 IMPLANT
TUBING CIL FLEX 10 FLL-RA (TUBING) ×2 IMPLANT
WIRE HITORQ VERSACORE ST 145CM (WIRE) ×1 IMPLANT

## 2017-05-08 NOTE — Interval H&P Note (Signed)
History and Physical Interval Note:  05/08/2017 11:26 AM  Michael Maddox  has presented today for surgery, with the diagnosis of pad  The various methods of treatment have been discussed with the patient and family. After consideration of risks, benefits and other options for treatment, the patient has consented to  Procedure(s): LOWER EXTREMITY INTERVENTION (N/A) as a surgical intervention .  The patient's history has been reviewed, patient examined, no change in status, stable for surgery.  I have reviewed the patient's chart and labs.  Questions were answered to the patient's satisfaction.     Quay Burow

## 2017-05-08 NOTE — Progress Notes (Signed)
Site area: left groin  Site Prior to Removal:  Level 0  Pressure Applied For 28 MINUTES    Minutes Beginning at 1622  Manual:   Yes.    Patient Status During Pull:  Patient complained of nausea, heart rate high 40's, old cloth to forehead and given zofran. Stated he felt better. No noted drop in bp, maintained in flat position. After sheath pull stated he was hungry and wanted to eat real food after offered gingerale and crackers to start. No further complaints. HOB to 30 degrees, patient stated change in position alleviated back pain.  Post Pull Groin Site:  Level 0  Post Pull Instructions Given:  Yes.    Post Pull Pulses Present:  Yes.    Dressing Applied:  Yes.    Comments:  No further complaints of pain and nausea. Resting comfortably in bed at 1930. Stated pain relieved with "sitting up and those 2 pills" (up to 30 degrees in bed and 2 vicodin)

## 2017-05-09 DIAGNOSIS — E785 Hyperlipidemia, unspecified: Secondary | ICD-10-CM | POA: Diagnosis not present

## 2017-05-09 DIAGNOSIS — Z951 Presence of aortocoronary bypass graft: Secondary | ICD-10-CM | POA: Diagnosis not present

## 2017-05-09 DIAGNOSIS — Z79899 Other long term (current) drug therapy: Secondary | ICD-10-CM | POA: Diagnosis not present

## 2017-05-09 DIAGNOSIS — Z95820 Peripheral vascular angioplasty status with implants and grafts: Secondary | ICD-10-CM | POA: Diagnosis not present

## 2017-05-09 DIAGNOSIS — Z7902 Long term (current) use of antithrombotics/antiplatelets: Secondary | ICD-10-CM | POA: Diagnosis not present

## 2017-05-09 DIAGNOSIS — Z87891 Personal history of nicotine dependence: Secondary | ICD-10-CM | POA: Diagnosis not present

## 2017-05-09 DIAGNOSIS — I700 Atherosclerosis of aorta: Secondary | ICD-10-CM | POA: Diagnosis not present

## 2017-05-09 DIAGNOSIS — I739 Peripheral vascular disease, unspecified: Secondary | ICD-10-CM

## 2017-05-09 DIAGNOSIS — Z7982 Long term (current) use of aspirin: Secondary | ICD-10-CM | POA: Diagnosis not present

## 2017-05-09 DIAGNOSIS — C61 Malignant neoplasm of prostate: Secondary | ICD-10-CM | POA: Diagnosis not present

## 2017-05-09 DIAGNOSIS — Z7984 Long term (current) use of oral hypoglycemic drugs: Secondary | ICD-10-CM | POA: Diagnosis not present

## 2017-05-09 DIAGNOSIS — I70212 Atherosclerosis of native arteries of extremities with intermittent claudication, left leg: Secondary | ICD-10-CM | POA: Diagnosis not present

## 2017-05-09 DIAGNOSIS — Z8673 Personal history of transient ischemic attack (TIA), and cerebral infarction without residual deficits: Secondary | ICD-10-CM | POA: Diagnosis not present

## 2017-05-09 DIAGNOSIS — I2510 Atherosclerotic heart disease of native coronary artery without angina pectoris: Secondary | ICD-10-CM | POA: Diagnosis not present

## 2017-05-09 DIAGNOSIS — I10 Essential (primary) hypertension: Secondary | ICD-10-CM | POA: Diagnosis not present

## 2017-05-09 DIAGNOSIS — T82898A Other specified complication of vascular prosthetic devices, implants and grafts, initial encounter: Secondary | ICD-10-CM | POA: Diagnosis not present

## 2017-05-09 LAB — BASIC METABOLIC PANEL
Anion gap: 8 (ref 5–15)
BUN: 12 mg/dL (ref 6–20)
CO2: 26 mmol/L (ref 22–32)
CREATININE: 0.96 mg/dL (ref 0.61–1.24)
Calcium: 8.5 mg/dL — ABNORMAL LOW (ref 8.9–10.3)
Chloride: 106 mmol/L (ref 101–111)
GFR calc Af Amer: >60 mL/min (ref >60)
Glucose, Bld: 139 mg/dL — ABNORMAL HIGH (ref 65–99)
Potassium: 4 mmol/L (ref 3.5–5.1)
SODIUM: 140 mmol/L (ref 135–145)

## 2017-05-09 LAB — CBC
HCT: 34.3 % — ABNORMAL LOW (ref 39.0–52.0)
Hemoglobin: 11 g/dL — ABNORMAL LOW (ref 13.0–17.0)
MCH: 29.2 pg (ref 26.0–34.0)
MCHC: 32.1 g/dL (ref 30.0–36.0)
MCV: 91 fL (ref 78.0–100.0)
PLATELETS: 179 10*3/uL (ref 150–400)
RBC: 3.77 MIL/uL — ABNORMAL LOW (ref 4.22–5.81)
RDW: 12.6 % (ref 11.5–15.5)
WBC: 4.8 10*3/uL (ref 4.0–10.5)

## 2017-05-09 LAB — GLUCOSE, CAPILLARY: Glucose-Capillary: 126 mg/dL — ABNORMAL HIGH (ref 65–99)

## 2017-05-09 NOTE — Progress Notes (Signed)
Progress Note  Patient Name: Michael Maddox Date of Encounter: 05/09/2017  Primary Cardiologist: Quay Burow, MD   Subjective   Feeling well. No chest pain, sob or palpitations.   Inpatient Medications    Scheduled Meds: . aspirin EC  81 mg Oral Daily  . clopidogrel  75 mg Oral QHS  . feeding supplement (ENSURE ENLIVE)  237 mL Oral BID BM  . metoprolol succinate  25 mg Oral QHS  . pantoprazole  40 mg Oral Daily  . simvastatin  40 mg Oral QHS  . sodium chloride flush  3 mL Intravenous Q12H   Continuous Infusions: . sodium chloride     PRN Meds: sodium chloride, acetaminophen, hydrALAZINE, HYDROcodone-acetaminophen, labetalol, ondansetron (ZOFRAN) IV, sodium chloride flush   Vital Signs    Vitals:   05/08/17 2300 05/09/17 0000 05/09/17 0100 05/09/17 0400  BP: (!) 134/37 (!) 138/53 99/63 (!) 148/36  Pulse: (!) 54 (!) 51 (!) 46 (!) 50  Resp: 14 15 13 17   Temp:    98.2 F (36.8 C)  TempSrc:    Oral  SpO2:  98% 99% 98%  Weight:    155 lb 6.8 oz (70.5 kg)  Height:        Intake/Output Summary (Last 24 hours) at 05/09/2017 0802 Last data filed at 05/09/2017 0400 Gross per 24 hour  Intake 990 ml  Output 325 ml  Net 665 ml   Filed Weights   05/08/17 0930 05/09/17 0400  Weight: 150 lb (68 kg) 155 lb 6.8 oz (70.5 kg)    Telemetry    Sinus rhythm with PACs- Personally Reviewed  ECG    N/A  Physical Exam   GEN: No acute distress.   Neck: No JVD Cardiac: RRR, no murmurs, rubs, or gallops. Left groin cath site without hematoma Respiratory: Clear to auscultation bilaterally. GI: Soft, nontender, non-distended  MS: No edema; No deformity. Neuro:  Nonfocal  Psych: Normal affect   Labs    Chemistry Recent Labs  Lab 05/08/17 0905 05/09/17 0451  NA 142 140  K 5.0 4.0  CL 108 106  CO2 25 26  GLUCOSE 128* 139*  BUN 10 12  CREATININE 0.83 0.96  CALCIUM 8.8* 8.5*  GFRNONAA >60 >60  GFRAA >60 >60  ANIONGAP 9 8     Hematology Recent Labs  Lab  05/08/17 0905 05/09/17 0451  WBC 3.8* 4.8  RBC 4.27 3.77*  HGB 12.6* 11.0*  HCT 39.1 34.3*  MCV 91.6 91.0  MCH 29.5 29.2  MCHC 32.2 32.1  RDW 12.6 12.6  PLT 212 179     Radiology    No results found.  Cardiac Studies   PERIPHERAL VASCULAR BALLOON ANGIOPLASTY  ABDOMINAL AORTOGRAM  Lower Extremity Angiography  Angiographic Data:   1: Abdominal aorta-the distal abdominal aorta was tapered and atherosclerotic 2: Left lower extremity-the entire left common and external iliac arteries were 90+ percent stenosed there was a 95-90% stenosis in the distal common femoral artery. The left SFA had diffuse 90+ percent stenosis in its proximal third. The stent in the midportion was occluded. The distal SFA filled in the adductor canal by profunda femoral collaterals with three-vessel runoff 3: Right lower extremity-the right common and external iliac arteries were occluded. The right common femoral artery filled faintly by collaterals. The right SFA was 9% stenosed in its proximal portion. The stent in the mid SFA was occluded. The distal right SFA filled by collaterals and there is three-vessel runoff.  IMPRESSION: Michael Maddox has  occluded right iliac system with subtotally occluded left iliac system. The SFAs are occluded as well in the stented portions. Both distal SFAs fill by collaterals in the tibial vessels appear unaffected. I'm going to attempt to perform PTA of the left common and external iliac arteries.  Procedure Description: The patient is admitted to 7500 units of heparin with an ACT of 252. Total contrast administered the patient was 155 mL. The 5 French sheath was exchanged for 6 French sheath over a 0.35 wire. PTA was performed with 4 mm x 80 mm long PTA balloon of the left common and external iliac artery. This did not appear to improve circulation substantially. There may have been a dissection in the external iliac artery. I decided to abort the procedure. The patient probably  will require surgical revascularization.  Final Impression: Unsuccessful left common and external iliac artery PTA with occluded right iliac system and bilateral SFA occlusions. There are no good percutaneous options. He may be up For aortobifem although his runoff is very poor. The sheath will be removed once the ACT falls below 170 and pressure held. He'll be hydrated and discharged home in the morning. I will see him back in the office in one to 2 weeks to discuss his options.   Patient Profile     67 y.o. male with hx of PAD status post left iliac and bilateral SFA intervention in 2005, HTN, HLD, s/p right carotid endarterectomy performed by Dr. [name] in November 2010 for high-grade disease in the setting of a stroke and CAD s/p CABG presented for angiography and potential endovascular therapy for lifestyle limiting claudication.   Assessment & Plan    1. PAD - Unsuccessful left common and external iliac artery PTA with occluded right iliac system and bilateral SFA occlusions. There are no good percutaneous options. He may be up For aortobifem although his runoff is very poor.  - SCr stable. Continue ASA, Plavix and statin.  - Cancel doppler study on 1/24. Follow up with Dr. [name] 1/30.   For questions or updates, please contact [place] Please consult [email or web address] for contact info under Cardiology/STEMI.      [place] Longest Shepherd, PA  05/09/2017, 8:02 AM    Patient seen and examined this morning along with Mr. Curly [place], [place].  He unfortunately had occlusive lower extremity arterial disease that would likely require aortobifem surgery.  He has poor peripheral runoff but his feet seem warm.  He is able to walk without significant claudication unless he goes more than several blocks.  At this point he is stable for discharge post procedure.  Renal function is stable.  Blood pressure is stable.  We will continue Plavix until he has a scheduled appointment for vascular  surgery. Continue statin -consider converting from simvastatin to a more potent statin if not at goal.  Glenetta Hew, M.D., M.S. Interventional Cardiologist   Pager # [phone] Phone # [phone] [address] [place], [place] [address]

## 2017-05-09 NOTE — Discharge Summary (Signed)
Discharge Summary    Patient ID: Michael Maddox,  MRN: [id number], DOB/AGE: [birth year] 67 y.o.  Admit date: 05/08/2017 Discharge date: 05/09/2017  Primary Care Provider: Nolene Ebbs Primary Cardiologist: Dr. [name]   Discharge Diagnoses    Active Problems:   Peripheral arterial disease [facility])   Claudication in peripheral vascular disease ([place])   HTN   HLd   S/p right carotid endarterectomy   CAd s/p CABG  Allergies No Known Allergies  Diagnostic Studies/Procedures  67 y.o. male with hx of PAD status post left iliac and bilateral SFA intervention in 2005, HTN, HLD, s/p right carotid endarterectomy performed by Dr. [name] in November 2010 for high-grade disease in the setting of a stroke and CAD s/p CABG presented for angiography and potential endovascular therapy for lifestyle limiting claudication.      History of Present Illness     PERIPHERAL VASCULAR BALLOON ANGIOPLASTY  ABDOMINAL AORTOGRAM  Lower Extremity Angiography  Angiographic Data:   1: Abdominal aorta-the distal abdominal aorta was tapered and atherosclerotic 2: Left lower extremity-the entire left common and external iliac arteries were 90+ percent stenosed there was a 95-90% stenosis in the distal common femoral artery. The left SFA had diffuse 90+ percent stenosis in its proximal third. The stent in the midportion was occluded. The distal SFA filled in the adductor canal by profunda femoral collaterals with three-vessel runoff 3: Right lower extremity-the right common and external iliac arteries were occluded. The right common femoral artery filled faintly by collaterals. The right SFA was 9% stenosed in its proximal portion. The stent in the mid SFA was occluded. The distal right SFA filled by collaterals and there is three-vessel runoff.  IMPRESSION:Mr. Bluitt has occluded right iliac system with subtotally occluded left iliac system. The SFAs are occluded as well in the stented portions. Both distal  SFAs fill by collaterals in the tibial vessels appear unaffected. I'm going to attempt to perform PTA of the left common and external iliac arteries.  Procedure Description: The patient is admitted to 7500 units of heparin with an ACT of 252. Total contrast administered the patient was 155 mL. The 5 French sheath was exchanged for 6 French sheath over a 0.35 wire. PTA was performed with 4 mm x 80 mm long PTA balloon of the left common and external iliac artery. This did not appear to improve circulation substantially. There may have been a dissection in the external iliac artery. I decided to abort the procedure. The patient probably will require surgical revascularization.  Final Impression:Unsuccessful left common and external iliac artery PTA with occluded right iliac system and bilateral SFA occlusions. There are no good percutaneous options. He may be up For aortobifem although his runoff is very poor. The sheath will be removed once the ACT falls below 170 and pressure held. He'll be hydrated and discharged home in the morning. I will see him back in the office in one to 2 weeks to discuss his options.    Hospital Course     Consultants: None  1. PAD - Unsuccessful left common and external iliac artery PTA with occluded right iliac system and bilateral SFA occlusions. There are no good percutaneous options. He may be up For aortobifem although his runoff is very poor.  - SCr stable. Continue ASA, Plavix and statin.  - Cancel doppler study on 1/24. Follow up with Dr. [name] 1/30.   2. HLd - No results found for requested labs within last 8760 hours. Continue statin -consider  converting from simvastatin to a more potent statin if not at goal.   The patient has been seen by Dr. Ellyn Hack  today and deemed ready for discharge home. All follow-up appointments have been scheduled. Discharge medications are listed below.  _____________   Discharge Vitals Blood pressure (!) 139/50, pulse (!)  48, temperature 98 F (36.7 C), temperature source Oral, resp. rate 17, height 5\' 11"  (1.803 m), weight 155 lb 6.8 oz (70.5 kg), SpO2 100 %.  Filed Weights   05/08/17 0930 05/09/17 0400  Weight: 150 lb (68 kg) 155 lb 6.8 oz (70.5 kg)    Labs & Radiologic Studies     CBC Recent Labs    05/08/17 0905 05/09/17 0451  WBC 3.8* 4.8  HGB 12.6* 11.0*  HCT 39.1 34.3*  MCV 91.6 91.0  PLT 212 062   Basic Metabolic Panel Recent Labs    05/08/17 0905 05/09/17 0451  NA 142 140  K 5.0 4.0  CL 108 106  CO2 25 26  GLUCOSE 128* 139*  BUN 10 12  CREATININE 0.83 0.96  CALCIUM 8.8* 8.5*    Disposition   Pt is being discharged home today in good condition.  Follow-up Plans & Appointments    Follow-up Information    Lorretta Harp, MD. Go on 05/21/2017.   Specialties:  Cardiology, Radiology Why:  @9am  for hospital follow up  Contact information: [address] [place] [address] [phone]          Discharge Instructions    Diet - low sodium heart healthy   Complete by:  As directed    Discharge instructions   Complete by:  As directed    No driving for 48 hours. No lifting over 5 lbs for 1 week. No sexual activity for 1 week. You may return to work on 05/14/17 Keep procedure site clean & dry. If you notice increased pain, swelling, bleeding or pus, call/return!  You may shower, but no soaking baths/hot tubs/pools for 1 week.   Hold metformin today. Resume tomorrow.   Increase activity slowly   Complete by:  As directed       Discharge Medications   Allergies as of 05/09/2017   No Known Allergies     Medication List    TAKE these medications   aspirin 81 MG chewable tablet Chew 1 tablet (81 mg total) by mouth daily. What changed:  when to take this   clopidogrel 75 MG tablet Commonly known as:  PLAVIX Take 75 mg by mouth at bedtime.   DEXILANT 60 MG capsule Generic drug:  dexlansoprazole Take 60 mg by mouth daily as needed (for  reflux.).   HYDROcodone-acetaminophen 10-325 MG tablet Commonly known as:  NORCO Take 1-2 tablets by mouth every 4 (four) hours as needed for moderate pain. Maximum dose per 24 hours - 8 pills   hydrocortisone 2.5 % ointment Apply 1 application topically 2 (two) times daily as needed (for skin irritation).   metFORMIN 500 MG tablet Commonly known as:  GLUCOPHAGE Take 500 mg by mouth at bedtime.   metoprolol succinate 25 MG 24 hr tablet Commonly known as:  TOPROL-XL Take 25 mg by mouth at bedtime.   multivitamin with minerals Tabs tablet Take 1 tablet by mouth once a week.   simvastatin 20 MG tablet Commonly known as:  ZOCOR Take 20 mg by mouth at bedtime.         Outstanding Labs/Studies   None  Duration of Discharge Encounter  Greater than 30 minutes including physician time.  Signed, Crista Luria Georgena Weisheit PA-C 05/09/2017, 8:56 AM

## 2017-05-12 DIAGNOSIS — Z8546 Personal history of malignant neoplasm of prostate: Secondary | ICD-10-CM | POA: Diagnosis not present

## 2017-05-15 ENCOUNTER — Inpatient Hospital Stay (HOSPITAL_COMMUNITY)
Admission: RE | Admit: 2017-05-15 | Payer: Medicare Other | Source: Ambulatory Visit | Attending: Cardiovascular Disease | Admitting: Cardiovascular Disease

## 2017-05-16 NOTE — Addendum Note (Signed)
Addended by: Therisa Doyne on: 05/16/2017 10:06 AM   Modules accepted: Orders

## 2017-05-21 ENCOUNTER — Encounter: Payer: Self-pay | Admitting: Cardiovascular Disease

## 2017-05-21 ENCOUNTER — Ambulatory Visit: Payer: Medicare Other | Admitting: Cardiovascular Disease

## 2017-05-21 VITALS — BP 142/70 | HR 66 | Ht 71.0 in | Wt 154.6 lb

## 2017-05-21 DIAGNOSIS — I739 Peripheral vascular disease, unspecified: Secondary | ICD-10-CM | POA: Diagnosis not present

## 2017-05-21 MED ORDER — CILOSTAZOL 50 MG PO TABS: 50.0000 mg | ORAL_TABLET | Freq: Two times a day (BID) | ORAL | 6 refills | Status: AC

## 2017-05-21 NOTE — Progress Notes (Signed)
05/21/2017 Michael Maddox   12-Feb-1951  [id number]  Primary Physician Michael Ebbs, MD Primary Cardiologist: Michael Harp MD Michael Maddox, [place]  HPI:  Michael Maddox is a 67 y.o.  father of 3 children, grandfather of 3grandchildren who is a patient of Dr. Christean Grief Maddox's he has a history of peripheral arterial disease.I last saw him in the office 05/07/17. He isstatus post left iliac and bilateral SFA intervention most recently in 2005 by myself. He discontinued tobacco abuse at that time. He also has 2 hypertension and hyperlipidemia. He had right carotid endarterectomy performed by Dr. Joseph Maddox in November 2010 for high-grade disease in the setting of a stroke. He denies chest pain but does get somewhat shortness of breath which has not changed. His last functional study was performed 12 years ago and was unremarkable. The palpitations are fairly recent onset. He can "hear his heartbeat in his ears" and does feel somewhat dizzy/presyncopal. A Doppler study of his lower extremities revealed severe arterial insufficiency with a right ABI 0.25 the left of 0.39. He had severe iliac and SFA disease. Carotid Dopplers were performed as well which revealed moderate right carotid disease and moderate to severe left. His Myoview stress test that showed inferoseptal ischemia thought to be "high risk". Heunderwentcardiac catheterization by myself 08/16/13 revealing three-vessel disease. 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 Merilynn Maddox 08/23/13 with a free LIMA to his LAD, vein to an obtuse marginal branch and PDA.I see him back for over 3 years. He does complain of less tolerating claudication but denies chest pain or shortness of breath. Importantly, he has recently been diagnosed with prostate cancer and is being scheduled for radioactive seed implantation.He had prostate cancer with  seed implantation performed in December. Heunderwent peripheral angiography by myself 05/08/17. Left femoral approach revealing an occluded right common, external and common femoral arteries as well as right SFA with two-vessel runoff. He had subtotally occluded left iliac as well as SFA.I did not feel that he had surgical or endovascular options. He returns today for follow-up He does have mild to moderate lifestyle limiting claudication.  Current Meds  Medication Sig  . aspirin 81 MG chewable tablet Chew 1 tablet (81 mg total) by mouth daily. (Patient taking differently: Chew 81 mg by mouth at bedtime. )  . clopidogrel (PLAVIX) 75 MG tablet Take 75 mg by mouth at bedtime.   Marland Kitchen dexlansoprazole (DEXILANT) 60 MG capsule Take 60 mg by mouth daily as needed (for reflux.).   Marland Kitchen HYDROcodone-acetaminophen (NORCO) 10-325 MG tablet Take 1-2 tablets by mouth every 4 (four) hours as needed for moderate pain. Maximum dose per 24 hours - 8 pills  . hydrocortisone 2.5 % ointment Apply 1 application topically 2 (two) times daily as needed (for skin irritation).   . metFORMIN (GLUCOPHAGE) 500 MG tablet Take 500 mg by mouth at bedtime.   . metoprolol succinate (TOPROL-XL) 25 MG 24 hr tablet Take 25 mg by mouth at bedtime.   . Multiple Vitamin (MULTIVITAMIN WITH MINERALS) TABS tablet Take 1 tablet by mouth once a week.   . simvastatin (ZOCOR) 20 MG tablet Take 20 mg by mouth at bedtime.      No Known Allergies  Social History   Socioeconomic History  . Marital status: Married    Spouse name: Not on file  . Number of children: Not on file  . Years of  education: Not on file  . Highest education level: Not on file  Social Needs  . Financial resource strain: Not on file  . Food insecurity - worry: Not on file  . Food insecurity - inability: Not on file  . Transportation needs - medical: Not on file  . Transportation needs - non-medical: Not on file  Occupational History  . Not on file  Tobacco Use  .  Smoking status: Former Smoker    Packs/day: 1.00    Years: 35.00    Pack years: 35.00    Types: Cigarettes    Last attempt to quit: 05/04/2013    Years since quitting: 4.0  . Smokeless tobacco: Never Used  Substance and Sexual Activity  . Alcohol use: Yes    Alcohol/week: 2.4 oz    Types: 4 Standard drinks or equivalent per week  . Drug use: No  . Sexual activity: Not on file  Other Topics Concern  . Not on file  Social History Narrative  . Not on file     Review of Systems: General: negative for chills, fever, night sweats or weight changes.  Cardiovascular: negative for chest pain, dyspnea on exertion, edema, orthopnea, palpitations, paroxysmal nocturnal dyspnea or shortness of breath Dermatological: negative for rash Respiratory: negative for cough or wheezing Urologic: negative for hematuria Abdominal: negative for nausea, vomiting, diarrhea, bright red blood per rectum, melena, or hematemesis Neurologic: negative for visual changes, syncope, or dizziness All other systems reviewed and are otherwise negative except as noted above.    Blood pressure (!) 142/70, pulse 66, height 5\' 11"  (1.803 m), weight 154 lb 9.6 oz (70.1 kg).  General appearance: alert and no distress Neck: no adenopathy, no JVD, supple, symmetrical, trachea midline, thyroid not enlarged, symmetric, no tenderness/mass/nodules and soft bilateral carotid bruits Lungs: clear to auscultation bilaterally Heart: regular rate and rhythm, S1, S2 normal, no murmur, click, rub or gallop Extremities: extremities normal, atraumatic, no cyanosis or edema Pulses: absent pedal pulses Skin: Skin color, texture, turgor normal. No rashes or lesions Neurologic: Alert and oriented X 3, normal strength and tone. Normal symmetric reflexes. Normal coordination and gait  EKG not performed today  ASSESSMENT AND PLAN:   Peripheral arterial disease Michael Maddox) [name] returns today for post hospital follow-up. I performed  angiography on him 05/08/17 revealing severe aortoiliac and SFA occlusive disease probably not revascularizable either surgically or endovascularly. He has moderate lifestyle limiting claudication. There is no evidence of critical limb ischemia. I am going to begin him on Pletal 50 mg by mouth twice a day I will have him see mid-level provider back in 3 months for follow-up.      Michael Harp MD FACP,FACC,FAHA, [facility] 05/21/2017 9:20 AM

## 2017-05-21 NOTE — Assessment & Plan Note (Signed)
Mr. Goldammer returns today for post hospital follow-up. I performed angiography on him 05/08/17 revealing severe aortoiliac and SFA occlusive disease probably not revascularizable either surgically or endovascularly. He has moderate lifestyle limiting claudication. There is no evidence of critical limb ischemia. I am going to begin him on Pletal 50 mg by mouth twice a day I will have him see mid-level provider back in 3 months for follow-up.

## 2017-05-21 NOTE — Patient Instructions (Signed)
Medication Instructions: Your physician recommends that you continue on your current medications as directed. Please refer to the Current Medication list given to you today.  START Pletal (Cilostazol) 50 mg twice daily.   Labwork: I will request lab work from Dr. Santiago Bur office.   Follow-Up: We request that you follow-up in: 3 months with an extender and in 12 months with Dr Andria Rhein will receive a reminder letter in the mail two months in advance. If you don't receive a letter, please call our office to schedule the follow-up appointment.  If you need a refill on your cardiac medications before your next appointment, please call your pharmacy.

## 2017-06-04 DIAGNOSIS — E1165 Type 2 diabetes mellitus with hyperglycemia: Secondary | ICD-10-CM | POA: Diagnosis not present

## 2017-06-04 DIAGNOSIS — I739 Peripheral vascular disease, unspecified: Secondary | ICD-10-CM | POA: Diagnosis not present

## 2017-06-04 DIAGNOSIS — I10 Essential (primary) hypertension: Secondary | ICD-10-CM | POA: Diagnosis not present

## 2017-06-04 DIAGNOSIS — I2510 Atherosclerotic heart disease of native coronary artery without angina pectoris: Secondary | ICD-10-CM | POA: Diagnosis not present

## 2017-07-17 DIAGNOSIS — A4902 Methicillin resistant Staphylococcus aureus infection, unspecified site: Secondary | ICD-10-CM | POA: Diagnosis not present

## 2017-08-12 DIAGNOSIS — C61 Malignant neoplasm of prostate: Secondary | ICD-10-CM | POA: Diagnosis not present

## 2017-08-14 ENCOUNTER — Encounter: Payer: Self-pay | Admitting: Cardiology

## 2017-08-14 ENCOUNTER — Ambulatory Visit: Payer: Medicare Other | Admitting: Cardiology

## 2017-08-14 DIAGNOSIS — I6523 Occlusion and stenosis of bilateral carotid arteries: Secondary | ICD-10-CM | POA: Diagnosis not present

## 2017-08-14 DIAGNOSIS — E785 Hyperlipidemia, unspecified: Secondary | ICD-10-CM | POA: Diagnosis not present

## 2017-08-14 DIAGNOSIS — E119 Type 2 diabetes mellitus without complications: Secondary | ICD-10-CM | POA: Insufficient documentation

## 2017-08-14 DIAGNOSIS — Z951 Presence of aortocoronary bypass graft: Secondary | ICD-10-CM

## 2017-08-14 DIAGNOSIS — I739 Peripheral vascular disease, unspecified: Secondary | ICD-10-CM

## 2017-08-14 DIAGNOSIS — I10 Essential (primary) hypertension: Secondary | ICD-10-CM | POA: Diagnosis not present

## 2017-08-14 NOTE — Assessment & Plan Note (Signed)
CABG x 3 2015- no angina

## 2017-08-14 NOTE — Assessment & Plan Note (Signed)
On Glucophage 

## 2017-08-14 NOTE — Patient Instructions (Signed)
Kerin Ransom, PA-C, recommends that you schedule a follow-up appointment in 6 months with Dr [name]. You will receive a reminder letter in the mail two months in advance. If you don't receive a letter, please call our office to schedule the follow-up appointment.  If you need a refill on your cardiac medications before your next appointment, please call your pharmacy.

## 2017-08-14 NOTE — Assessment & Plan Note (Signed)
Pt has chronic 2 block claudication

## 2017-08-14 NOTE — Assessment & Plan Note (Signed)
Controlled.  

## 2017-08-14 NOTE — Assessment & Plan Note (Signed)
On low dose statin Rx-PCP follows

## 2017-08-14 NOTE — Assessment & Plan Note (Signed)
H/O RCE, last dopplers Nov 2018

## 2017-08-14 NOTE — Assessment & Plan Note (Addendum)
Extensive bilateral LE arterial disease- not amenable to surgery or PTA

## 2017-08-14 NOTE — Progress Notes (Signed)
08/14/2017 Michael Maddox   09-Sep-1950  [id number]  Primary Physician Nolene Ebbs, MD Primary Cardiologist: Dr [name]  HPI:  Pleasant 67 y/o male followed by Dr [name] with a history of CAD and PVD. He had CABG in 2015 and has done since. He has PVD and claudication. PVA in Jan 2019 showed extensive bilateral LEA disease. The plan is for medical Rx. He is on ASA, Pletal, and Plavix. He is in the office today for a 3 month follow up. He has done well since he saw Dr [name] in Jan. He can walk two blocks before he has leg pain. He has not had chest pain or unusual dyspnea. He smells of smoke but says he doesn't smoke, his wife does smoke though.    Current Outpatient Medications  Medication Sig Dispense Refill  . aspirin 81 MG chewable tablet Chew 1 tablet (81 mg total) by mouth daily. (Patient taking differently: Chew 81 mg by mouth at bedtime. )    . cilostazol (PLETAL) 50 MG tablet Take 1 tablet (50 mg total) by mouth 2 (two) times daily. 60 tablet 6  . clopidogrel (PLAVIX) 75 MG tablet Take 75 mg by mouth at bedtime.     Marland Kitchen dexlansoprazole (DEXILANT) 60 MG capsule Take 60 mg by mouth daily as needed (for reflux.).     Marland Kitchen HYDROcodone-acetaminophen (NORCO) 10-325 MG tablet Take 1-2 tablets by mouth every 4 (four) hours as needed for moderate pain. Maximum dose per 24 hours - 8 pills 12 tablet 0  . hydrocortisone 2.5 % ointment Apply 1 application topically 2 (two) times daily as needed (for skin irritation).   0  . metFORMIN (GLUCOPHAGE) 500 MG tablet Take 500 mg by mouth at bedtime.   2  . metoprolol succinate (TOPROL-XL) 25 MG 24 hr tablet Take 25 mg by mouth at bedtime.     . Multiple Vitamin (MULTIVITAMIN WITH MINERALS) TABS tablet Take 1 tablet by mouth once a week.     . simvastatin (ZOCOR) 20 MG tablet Take 20 mg by mouth at bedtime.      No current facility-administered medications for this visit.     No Known Allergies  Past Medical History:  Diagnosis Date  . Arthritis   .  Carotid artery disease ([place]) followed by dr berry--- per last duplex [id number]  LICA 37-85%, bilateral ECA >50%,  patent RICA post intervention   hx acute high grade stenosis RICA >80% 02-22-2009 in setting of  stroke  s/p  right carotid endarterectomy 02-28-2009/    . Claudication of both lower extremities ([place])    due to PAD ----  right > left  . COPD (chronic obstructive pulmonary disease) ([place])   . Coronary artery disease CARDIOLOGIST-  DR BERRY   hx positive myoview 07-29-2013;  08-16-2013 per cardiac cath, severe 3V CAD w/ 60% subclavian stenosis;  08-23-2013   s/p  CABG x3  (LIMA to LAD, SVG to OM1, SVG to PDA)  . CVA (cerebral vascular accident) ([place]) 02/22/2009   acute infarct right hemisphere w/ severe RICA stentosis----  per pt no residuals  . GERD (gastroesophageal reflux disease)   . HTN (hypertension)   . Hyperlipidemia   . Hyperplasia of prostate with lower urinary tract symptoms (LUTS)   . Peripheral arterial disease ([place]) followed by dr berry--- last dobbler ABIs bilateral SFA occlusions (pt is scheduled for intervention 05-08-2017   hx PTA and stenting to bilateral SFA 2002 and PTA w/ stenting left common iliac artery and  for in-stent restenosis 2003 Left SFA and 2005  stenting right SFA for in-stent restenosis  . Prostate cancer [facility]) urologist-  dr ottelin/  oncologist-  dr Tammi Klippel   dx 11-20-2016-- Stage T1c, Gleason 4+3, PSA 5.73, vol 35.95cc--- scheduled for radioactive seed implants 04-11-2017  . 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 S/P peripheral artery angioplasty with stent placement    bilateral SFA 2002;  in-stenosis bilateral SFA  (right 2003, left 2005) and left CIA 2003  . Shortness of breath   . Type II diabetes mellitus ([place])   . Weak urinary stream   . Wears glasses     Social History   Socioeconomic History  . Marital status: Married    Spouse name: Not on file  . Number of children: Not on file  . Years of education:  Not on file  . Highest education level: Not on file  Occupational History  . Not on file  Social Needs  . Financial resource strain: Not on file  . Food insecurity:    Worry: Not on file    Inability: Not on file  . Transportation needs:    Medical: Not on file    Non-medical: Not on file  Tobacco Use  . Smoking status: Former Smoker    Packs/day: 1.00    Years: 35.00    Pack years: 35.00    Types: Cigarettes    Last attempt to quit: 05/04/2013    Years since quitting: 4.2  . Smokeless tobacco: Never Used  Substance and Sexual Activity  . Alcohol use: Yes    Alcohol/week: 2.4 oz    Types: 4 Standard drinks or equivalent per week  . Drug use: No  . Sexual activity: Not on file  Lifestyle  . Physical activity:    Days per week: Not on file    Minutes per session: Not on file  . Stress: Not on file  Relationships  . Social connections:    Talks on phone: Not on file    Gets together: Not on file    Attends religious service: Not on file    Active member of club or organization: Not on file    Attends meetings of clubs or organizations: Not on file    Relationship status: Not on file  . Intimate partner violence:    Fear of current or ex partner: Not on file    Emotionally abused: Not on file    Physically abused: Not on file    Forced sexual activity: Not on file  Other Topics Concern  . Not on file  Social History Narrative  . Not on file     Family History  Problem Relation Age of Onset  . Diabetes Mother   . Heart disease Father      Review of Systems: General: negative for chills, fever, night sweats or weight changes.  Cardiovascular: negative for chest pain, dyspnea on exertion, edema, orthopnea, palpitations, paroxysmal nocturnal dyspnea or shortness of breath Dermatological: negative for rash Respiratory: negative for cough or wheezing Urologic: negative for hematuria Abdominal: negative for nausea, vomiting, diarrhea, bright red blood per rectum,  melena, or hematemesis Neurologic: negative for visual changes, syncope, or dizziness All other systems reviewed and are otherwise negative except as noted above.    Blood pressure 138/60, pulse 64, height 5\' 11"  (1.803 m), weight 151 lb (68.5 kg).  General appearance: alert, cooperative and no distress Neck: no  JVD and RCE scar, bilateral CA bruits Lungs: clear to auscultation bilaterally Heart: regular rate and rhythm Extremities: no edema, no hair growth on toes, both LE warm, no ulcers or wounds Skin: Skin color, texture, turgor normal. No rashes or lesions Neurologic: Grossly normal   ASSESSMENT AND PLAN:   Claudication in peripheral vascular disease (HCC) Pt has chronic 2 block claudication  Peripheral arterial disease ([facility]CC) Extensive bilateral LE arterial disease- not amenable to surgery or PTA  S/P CABG x 3 CABG x 3 2015- no angina  Essential hypertension Controlled  Hyperlipidemia On low dose statin Rx-PCP follows  Carotid artery disease ([place]) H/O RCE, last dopplers Nov 2018  Non-insulin treated type 2 diabetes mellitus ([place]) On Glucophage   PLAN  I will ask Dr [name] if he thinks the patient would benefit from Xarelto Rx 2.5 mg BID. F/U in 6 months.   Kerin Ransom PA-C 08/14/2017 8:29 AM

## 2017-08-19 DIAGNOSIS — R3911 Hesitancy of micturition: Secondary | ICD-10-CM | POA: Diagnosis not present

## 2017-08-19 DIAGNOSIS — Z8546 Personal history of malignant neoplasm of prostate: Secondary | ICD-10-CM | POA: Diagnosis not present

## 2017-08-25 DIAGNOSIS — L309 Dermatitis, unspecified: Secondary | ICD-10-CM | POA: Diagnosis not present

## 2017-08-25 DIAGNOSIS — Z8614 Personal history of Methicillin resistant Staphylococcus aureus infection: Secondary | ICD-10-CM | POA: Diagnosis not present

## 2017-09-03 DIAGNOSIS — I10 Essential (primary) hypertension: Secondary | ICD-10-CM | POA: Diagnosis not present

## 2017-09-03 DIAGNOSIS — C61 Malignant neoplasm of prostate: Secondary | ICD-10-CM | POA: Diagnosis not present

## 2017-09-03 DIAGNOSIS — E1165 Type 2 diabetes mellitus with hyperglycemia: Secondary | ICD-10-CM | POA: Diagnosis not present

## 2017-09-03 DIAGNOSIS — I2510 Atherosclerotic heart disease of native coronary artery without angina pectoris: Secondary | ICD-10-CM | POA: Diagnosis not present

## 2017-11-21 ENCOUNTER — Ambulatory Visit (INDEPENDENT_AMBULATORY_CARE_PROVIDER_SITE_OTHER): Payer: Medicare Other

## 2017-11-21 ENCOUNTER — Encounter (HOSPITAL_COMMUNITY): Payer: Self-pay | Admitting: Emergency Medicine

## 2017-11-21 ENCOUNTER — Ambulatory Visit (HOSPITAL_COMMUNITY)
Admission: EM | Admit: 2017-11-21 | Discharge: 2017-11-21 | Disposition: A | Discharge status: Home / Self Care | Payer: Medicare Other | Attending: Internal Medicine | Admitting: Internal Medicine

## 2017-11-21 DIAGNOSIS — K5900 Constipation, unspecified: Secondary | ICD-10-CM

## 2017-11-21 DIAGNOSIS — R109 Unspecified abdominal pain: Secondary | ICD-10-CM

## 2017-11-21 MED ORDER — FLEET ENEMA 7-19 GM/118ML RE ENEM: 1.0000 | ENEMA | Freq: Once | RECTAL | 0 refills | Status: AC

## 2017-11-21 MED ORDER — MAGNESIUM CITRATE PO SOLN: 1.0000 | Freq: Once | ORAL | 1 refills | Status: AC

## 2017-11-21 NOTE — ED Triage Notes (Signed)
Pt c/o abdominal pain x1 week, states he sometimes wakes up and vomits, pt states he feels like hes lost some weight. Pt has extensive medical hx.

## 2017-11-21 NOTE — Discharge Instructions (Signed)
It was nice meeting you!!  Your x ray showed a significant amount of gas and stool. I think this could be the reason for your symptoms.  Please try some mag citrate. If this does not help you may want to try a fleets enema. You can get these at the drug store.  If the neither 1 of these treatments works and you develop more severe abdominal pain, nausea, vomiting or leakage of stool please go to the ER.

## 2017-11-22 NOTE — ED Provider Notes (Signed)
[place]    CSN: [id number] Arrival date & time: 11/21/17  1884     History   Chief Complaint Chief Complaint  Patient presents with  . Abdominal Pain    HPI Michael Maddox is a 67 y.o. male.   Pt is a 67 year old male with multiple medical conditions. He presents with 1 week of abd discomfort and constipation. This has been constant and worsening with some vomiting after eating, loss of appetite, weight loss and leakage of stool. He has tried milk of magnesia without any relief. He denies any rectal bleeding, blood in stool or vomit. He denies any fever, chills, fatigue. He denies any dysuria, hematuria, frequency or urgency.   ROS per HPI      Past Medical History:  Diagnosis Date  . Arthritis   . Carotid artery disease ([place]) followed by dr berry--- per last duplex [id number]  LICA 60-10%, bilateral ECA >50%,  patent RICA post intervention   hx acute high grade stenosis RICA >80% 02-22-2009 in setting of  stroke  s/p  right carotid endarterectomy 02-28-2009/    . Claudication of both lower extremities ([place])    due to PAD ----  right > left  . COPD (chronic obstructive pulmonary disease) ([place])   . Coronary artery disease CARDIOLOGIST-  DR BERRY   hx positive myoview 07-29-2013;  08-16-2013 per cardiac cath, severe 3V CAD w/ 60% subclavian stenosis;  08-23-2013   s/p  CABG x3  (LIMA to LAD, SVG to OM1, SVG to PDA)  . CVA (cerebral vascular accident) ([place]) 02/22/2009   acute infarct right hemisphere w/ severe RICA stentosis----  per pt no residuals  . GERD (gastroesophageal reflux disease)   . HTN (hypertension)   . Hyperlipidemia   . Hyperplasia of prostate with lower urinary tract symptoms (LUTS)   . Peripheral arterial disease ([place]) followed by dr berry--- last dobbler ABIs bilateral SFA occlusions (pt is scheduled for intervention 05-08-2017   hx PTA and stenting to bilateral SFA 2002 and PTA w/ stenting left common iliac artery and for in-stent restenosis  2003 Left SFA and 2005  stenting right SFA for in-stent restenosis  . Prostate cancer [facility]) urologist-  dr ottelin/  oncologist-  dr Tammi Klippel   dx 11-20-2016-- Stage T1c, Gleason 4+3, PSA 5.73, vol 35.95cc--- scheduled for radioactive seed implants 04-11-2017  . 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 S/P peripheral artery angioplasty with stent placement    bilateral SFA 2002;  in-stenosis bilateral SFA  (right 2003, left 2005) and left CIA 2003  . Shortness of breath   . Type II diabetes mellitus ([place])   . Weak urinary stream   . Wears glasses     Patient Active Problem List   Diagnosis Date Noted  . Non-insulin treated type 2 diabetes mellitus ([place]) 08/14/2017  . Claudication in peripheral vascular disease ([place]) 05/08/2017  . Adenocarcinoma of prostate ([place]) 01/15/2017  . S/P CABG x 3 08/23/2013  . CAD (coronary artery disease) 08/16/2013  . Peripheral arterial disease ([place]) 07/02/2013  . Carotid artery disease ([place]) 07/02/2013  . Essential hypertension 07/02/2013  . Hyperlipidemia 07/02/2013  . Palpitations 07/02/2013    Past Surgical History:  Procedure Laterality Date  . ABDOMINAL AORTOGRAM N/A 05/08/2017   Procedure: ABDOMINAL AORTOGRAM;  Surgeon: Lorretta Harp, MD;  Location: [place] [facility];  Service: Cardiovascular;  Laterality: N/A;  . CARDIAC CATHETERIZATION  12-01-2000  dr berry    non-critial CAD & nonischemic cardiolite-- mLAD 50%, mLCx 70%, prox. to mid RCA 75% focal midsegment 80-85%  . CARDIOVASCULAR STRESS TEST  07-29-2013    dr berry   High risk nuclear study w/ inferoseptal ischemia/  normal LV function and wall motion,  nuclear study ef 52%  . CAROTID ENDARTERECTOMY Right 02-28-2009    dr c. Scot Dock  [facility]   patch angioplasty  . COLONOSCOPY    . CORONARY ARTERY BYPASS GRAFT N/A 08/23/2013   Procedure: CORONARY ARTERY BYPASS GRAFTING (CABG);  Surgeon: Melrose Nakayama, MD;  Location: [place];  Service: Open Heart Surgery;   Laterality: N/A;  Coronary Artery Bypass graft times three using left internal mammary artery and left leg saphenous vein   . CYSTOSCOPY N/A 04/11/2017   Procedure: CYSTOSCOPY FLEXIBLE;  Surgeon: Kathie Rhodes, MD;  Location: [facility];  Service: Urology;  Laterality: N/A;  no seeds found in bladder  . ENDOVASCULAR STENT INSERTION Bilateral right 12-25-2000 ;  left 03-02-2001   dr berry   PTA and stenting to bilateral SFA  . ENDOVASCULAR STENT INSERTION Bilateral right 08-30-2003 ;  left 11-10-2001    dr berry   PTA and stenting for in-stent restenosis right SFA 08-30-2003:  PTA and stenting distal left common iliac artery and in-stent restenosis left SFA 11-10-2001  . INTRAOPERATIVE TRANSESOPHAGEAL ECHOCARDIOGRAM N/A 08/23/2013   Procedure: INTRAOPERATIVE TRANSESOPHAGEAL ECHOCARDIOGRAM;  Surgeon: Melrose Nakayama, MD;  Location: [place];  Service: Open Heart Surgery;  Laterality: N/A;  . LEFT HEART CATHETERIZATION WITH CORONARY ANGIOGRAM N/A 08/16/2013   Procedure: LEFT HEART CATHETERIZATION WITH CORONARY ANGIOGRAM;  Surgeon: Lorretta Harp, MD;  Location[facility] C[facility];  Service: Cardiovascular;  Laterality: N/A;   severe 3V CAD and 60% subclavian sternosis  . LOWER EXTREMITY ANGIOGRAM N/A 08/16/2013   Procedure: LOWER EXTREMITY ANGIOGRAM;  Surgeon: Lorretta Harp, MD;  Location: [facility] CATH LAB;  Service: Cardiovascular;  Laterality: N/A;  . LOWER EXTREMITY ANGIOGRAPHY Bilateral 05/08/2017   Procedure: Lower Extremity Angiography;  Surgeon: Lorretta Harp, MD;  Location: [place] [facility];  Service: Cardiovascular;  Laterality: Bilateral;  . PERIPHERAL VASCULAR BALLOON ANGIOPLASTY Left 05/08/2017   Procedure: PERIPHERAL VASCULAR BALLOON ANGIOPLASTY;  Surgeon: Lorretta Harp, MD;  Location: [place] [facility];  Service: Cardiovascular;  Laterality: Left;  COMMON ILIAC  . RADIOACTIVE SEED IMPLANT N/A 04/11/2017   Procedure: RADIOACTIVE SEED IMPLANT/BRACHYTHERAPY IMPLANT;   Surgeon: Kathie Rhodes, MD;  Location: [facility];  Service: Urology;  Laterality: N/A;    67   seeds implanted  . SPACE OAR INSTILLATION N/A 04/11/2017   Procedure: SPACE OAR INSTILLATION;  Surgeon: Kathie Rhodes, MD;  Location: [facility];  Service: Urology;  Laterality: N/A;  . TRANSTHORACIC ECHOCARDIOGRAM  07/15/2013   ef 63-87%, grade 1 diastolic dysfunction/  trivial MR, mild to moderate PR, mild TR        Home Medications    Prior to Admission medications   Medication Sig Start Date End Date Taking? Authorizing Provider  aspirin 81 MG chewable tablet Chew 1 tablet (81 mg total) by mouth daily. Patient taking differently: Chew 81 mg by mouth at bedtime.  08/17/13   Brett Canales, PA-C  cilostazol (PLETAL) 50 MG tablet Take 1 tablet (50 mg total) by mouth 2 (two) times daily. 05/21/17   Lorretta Harp, MD  clopidogrel (PLAVIX) 75 MG tablet Take 75 mg by mouth at bedtime.  07/21/13   [provider]  dexlansoprazole (DEXILANT) 60 MG capsule Take 60 mg by mouth daily as needed (for reflux.).     [provider]  HYDROcodone-acetaminophen (NORCO) 10-325 MG tablet Take 1-2 tablets by mouth every 4 (four) hours as needed for moderate pain. Maximum dose per 24 hours - 8 pills 04/11/17   Kathie Rhodes, MD  hydrocortisone 2.5 % ointment Apply 1 application topically 2 (two) times daily as needed (for skin irritation).  02/18/17   [provider]  metFORMIN (GLUCOPHAGE) 500 MG tablet Take 500 mg by mouth at bedtime.  04/14/17   [provider]  metoprolol succinate (TOPROL-XL) 25 MG 24 hr tablet Take 25 mg by mouth at bedtime.  08/31/13   [provider]  Multiple Vitamin (MULTIVITAMIN WITH MINERALS) TABS tablet Take 1 tablet by mouth once a week.     [provider]  simvastatin (ZOCOR) 20 MG tablet Take 20 mg by mouth at bedtime.  08/31/13   [provider]    Family History Family History  Problem  Relation Age of Onset  . Diabetes Mother   . Heart disease Father     Social History Social History   Tobacco Use  . Smoking status: Former Smoker    Packs/day: 1.00    Years: 35.00    Pack years: 35.00    Types: Cigarettes    Last attempt to quit: 05/04/2013    Years since quitting: 4.5  . Smokeless tobacco: Never Used  Substance Use Topics  . Alcohol use: Yes    Alcohol/week: 2.4 oz    Types: 4 Standard drinks or equivalent per week  . Drug use: No     Allergies   Patient has no known allergies.   Review of Systems Review of Systems   Physical Exam Triage Vital Signs ED Triage Vitals  Enc Vitals Group     BP 11/21/17 1010 (!) 156/64     Pulse Rate 11/21/17 1010 66     Resp 11/21/17 1010 16     Temp 11/21/17 1010 98 F (36.7 C)     Temp src --      SpO2 11/21/17 1010 99 %     Weight --      Height --      Head Circumference --      Peak Flow --      Pain Score 11/21/17 1011 4     Pain Loc --      Pain Edu? --      Excl. in [place]? --    No data found.  Updated Vital Signs BP (!) 156/64   Pulse 66   Temp 98 F (36.7 C)   Resp 16   SpO2 99%   Visual Acuity Right Eye Distance:   Left Eye Distance:   Bilateral Distance:    Right Eye Near:   Left Eye Near:    Bilateral Near:     Physical Exam  Constitutional:  Non-toxic appearance. He does not appear ill. No distress.  Very thin but pleasent  HENT:  Head: Normocephalic and atraumatic.  Cardiovascular: Normal rate and normal heart sounds.  Pulmonary/Chest: Effort normal and breath sounds normal.  Abdominal: Normal appearance, normal aorta and bowel sounds are normal.   Generalized abdominal tenderness with slight distention.  Negative rebound.  Negative guarding.  No masses palpated.  Negative for bruits.   Neurological: He is alert.  Skin: Skin is warm and dry. Capillary refill takes less than 2 seconds.  Psychiatric: He has a normal mood and affect.  Nursing note and vitals  reviewed.    UC Treatments / Results  Labs (all labs ordered are listed, but only abnormal results are displayed) Labs Reviewed - No data to display  EKG None  Radiology No results found.  Procedures Procedures (including critical care time)  Medications Ordered in UC Medications - No data to display  Initial Impression / Assessment and Plan / UC Course  I have reviewed the triage vital signs and the nursing notes.  Pertinent labs & imaging results that were available during my care of the patient were reviewed by me and considered in my medical decision making (see chart for details).     X-ray showed constipation.  Will try mag citrate and if no relief from this instructed patient that he may need to do an enema.  If no relief or for worsening symptoms please go to the ER.  Patient agreeable to plan Final Clinical Impressions(s) / UC Diagnoses   Final diagnoses:  Constipation, unspecified constipation type     Discharge Instructions     It was nice meeting you!!  Your x ray showed a significant amount of gas and stool. I think this could be the reason for your symptoms.  Please try some mag citrate. If this does not help you may want to try a fleets enema. You can get these at the drug store.  If the neither 1 of these treatments works and you develop more severe abdominal pain, nausea, vomiting or leakage of stool please go to the ER.    ED Prescriptions    Medication Sig Dispense Auth. Provider   magnesium citrate SOLN Take 296 mLs (1 Bottle total) by mouth once for 1 dose. 195 mL Alysia Scism A, NP   sodium phosphate (FLEET) 7-19 GM/118ML ENEM Place 133 mLs (1 enema total) rectally once for 1 dose. 1 enema Loura Halt A, NP     Controlled Substance Prescriptions [place] Controlled Substance Registry consulted? Not Applicable   Orvan July, NP 11/24/17 1606

## 2017-11-27 DIAGNOSIS — L0100 Impetigo, unspecified: Secondary | ICD-10-CM | POA: Diagnosis not present

## 2017-11-27 DIAGNOSIS — Z8614 Personal history of Methicillin resistant Staphylococcus aureus infection: Secondary | ICD-10-CM | POA: Diagnosis not present

## 2017-11-27 DIAGNOSIS — L309 Dermatitis, unspecified: Secondary | ICD-10-CM | POA: Diagnosis not present

## 2017-12-03 DIAGNOSIS — I2510 Atherosclerotic heart disease of native coronary artery without angina pectoris: Secondary | ICD-10-CM | POA: Diagnosis not present

## 2017-12-03 DIAGNOSIS — R109 Unspecified abdominal pain: Secondary | ICD-10-CM | POA: Diagnosis not present

## 2017-12-03 DIAGNOSIS — E1165 Type 2 diabetes mellitus with hyperglycemia: Secondary | ICD-10-CM | POA: Diagnosis not present

## 2017-12-03 DIAGNOSIS — I10 Essential (primary) hypertension: Secondary | ICD-10-CM | POA: Diagnosis not present

## 2017-12-11 DIAGNOSIS — I700 Atherosclerosis of aorta: Secondary | ICD-10-CM | POA: Diagnosis not present

## 2017-12-11 DIAGNOSIS — R933 Abnormal findings on diagnostic imaging of other parts of digestive tract: Secondary | ICD-10-CM | POA: Diagnosis not present

## 2017-12-11 DIAGNOSIS — R932 Abnormal findings on diagnostic imaging of liver and biliary tract: Secondary | ICD-10-CM | POA: Diagnosis not present

## 2017-12-11 DIAGNOSIS — I708 Atherosclerosis of other arteries: Secondary | ICD-10-CM | POA: Diagnosis not present

## 2017-12-15 ENCOUNTER — Other Ambulatory Visit: Payer: Self-pay | Admitting: Gastroenterology

## 2017-12-15 DIAGNOSIS — R634 Abnormal weight loss: Secondary | ICD-10-CM | POA: Diagnosis not present

## 2017-12-15 DIAGNOSIS — R1013 Epigastric pain: Secondary | ICD-10-CM | POA: Diagnosis not present

## 2017-12-15 DIAGNOSIS — R933 Abnormal findings on diagnostic imaging of other parts of digestive tract: Secondary | ICD-10-CM | POA: Diagnosis not present

## 2017-12-19 ENCOUNTER — Encounter (HOSPITAL_COMMUNITY)
Admission: RE | Disposition: A | Discharge status: Home / Self Care | Payer: Self-pay | Source: Ambulatory Visit | Attending: Gastroenterology

## 2017-12-19 ENCOUNTER — Ambulatory Visit (HOSPITAL_COMMUNITY): Payer: Medicare Other | Admitting: Anesthesiology

## 2017-12-19 ENCOUNTER — Ambulatory Visit (HOSPITAL_COMMUNITY)
Admission: RE | Admit: 2017-12-19 | Discharge: 2017-12-19 | Disposition: A | Discharge status: Home / Self Care | Payer: Medicare Other | Source: Ambulatory Visit | Attending: Gastroenterology | Admitting: Gastroenterology

## 2017-12-19 ENCOUNTER — Other Ambulatory Visit: Payer: Self-pay

## 2017-12-19 ENCOUNTER — Encounter (HOSPITAL_COMMUNITY): Payer: Self-pay | Admitting: *Deleted

## 2017-12-19 DIAGNOSIS — I2510 Atherosclerotic heart disease of native coronary artery without angina pectoris: Secondary | ICD-10-CM | POA: Insufficient documentation

## 2017-12-19 DIAGNOSIS — Z951 Presence of aortocoronary bypass graft: Secondary | ICD-10-CM | POA: Diagnosis not present

## 2017-12-19 DIAGNOSIS — Z8673 Personal history of transient ischemic attack (TIA), and cerebral infarction without residual deficits: Secondary | ICD-10-CM | POA: Insufficient documentation

## 2017-12-19 DIAGNOSIS — Z8249 Family history of ischemic heart disease and other diseases of the circulatory system: Secondary | ICD-10-CM | POA: Insufficient documentation

## 2017-12-19 DIAGNOSIS — E785 Hyperlipidemia, unspecified: Secondary | ICD-10-CM | POA: Insufficient documentation

## 2017-12-19 DIAGNOSIS — R634 Abnormal weight loss: Secondary | ICD-10-CM | POA: Diagnosis not present

## 2017-12-19 DIAGNOSIS — C251 Malignant neoplasm of body of pancreas: Secondary | ICD-10-CM | POA: Diagnosis not present

## 2017-12-19 DIAGNOSIS — Z833 Family history of diabetes mellitus: Secondary | ICD-10-CM | POA: Diagnosis not present

## 2017-12-19 DIAGNOSIS — K219 Gastro-esophageal reflux disease without esophagitis: Secondary | ICD-10-CM | POA: Diagnosis not present

## 2017-12-19 DIAGNOSIS — I739 Peripheral vascular disease, unspecified: Secondary | ICD-10-CM | POA: Insufficient documentation

## 2017-12-19 DIAGNOSIS — Z87891 Personal history of nicotine dependence: Secondary | ICD-10-CM | POA: Insufficient documentation

## 2017-12-19 DIAGNOSIS — N401 Enlarged prostate with lower urinary tract symptoms: Secondary | ICD-10-CM | POA: Diagnosis not present

## 2017-12-19 DIAGNOSIS — E119 Type 2 diabetes mellitus without complications: Secondary | ICD-10-CM | POA: Diagnosis not present

## 2017-12-19 DIAGNOSIS — R0602 Shortness of breath: Secondary | ICD-10-CM | POA: Diagnosis not present

## 2017-12-19 DIAGNOSIS — I10 Essential (primary) hypertension: Secondary | ICD-10-CM | POA: Diagnosis not present

## 2017-12-19 DIAGNOSIS — Z681 Body mass index (BMI) 19 or less, adult: Secondary | ICD-10-CM | POA: Insufficient documentation

## 2017-12-19 DIAGNOSIS — J449 Chronic obstructive pulmonary disease, unspecified: Secondary | ICD-10-CM | POA: Diagnosis not present

## 2017-12-19 DIAGNOSIS — K8689 Other specified diseases of pancreas: Secondary | ICD-10-CM | POA: Diagnosis not present

## 2017-12-19 DIAGNOSIS — M1990 Unspecified osteoarthritis, unspecified site: Secondary | ICD-10-CM | POA: Diagnosis not present

## 2017-12-19 DIAGNOSIS — E1151 Type 2 diabetes mellitus with diabetic peripheral angiopathy without gangrene: Secondary | ICD-10-CM | POA: Diagnosis not present

## 2017-12-19 DIAGNOSIS — Z79899 Other long term (current) drug therapy: Secondary | ICD-10-CM | POA: Insufficient documentation

## 2017-12-19 DIAGNOSIS — R1013 Epigastric pain: Secondary | ICD-10-CM | POA: Diagnosis not present

## 2017-12-19 DIAGNOSIS — R933 Abnormal findings on diagnostic imaging of other parts of digestive tract: Secondary | ICD-10-CM | POA: Diagnosis not present

## 2017-12-19 HISTORY — PX: UPPER ESOPHAGEAL ENDOSCOPIC ULTRASOUND (EUS): SHX6562

## 2017-12-19 HISTORY — PX: FINE NEEDLE ASPIRATION: SHX5430

## 2017-12-19 LAB — GLUCOSE, CAPILLARY: GLUCOSE-CAPILLARY: 151 mg/dL — AB (ref 70–99)

## 2017-12-19 SURGERY — UPPER ESOPHAGEAL ENDOSCOPIC ULTRASOUND (EUS)
Anesthesia: Monitor Anesthesia Care

## 2017-12-19 MED ORDER — LIDOCAINE 2% (20 MG/ML) 5 ML SYRINGE
INTRAMUSCULAR | Status: DC | PRN
  Administered 2017-12-19: 30 mg via INTRAVENOUS

## 2017-12-19 MED ORDER — ONDANSETRON HCL 4 MG/2ML IJ SOLN
INTRAMUSCULAR | Status: DC | PRN
  Administered 2017-12-19: 4 mg via INTRAVENOUS

## 2017-12-19 MED ORDER — MIDAZOLAM HCL 2 MG/2ML IJ SOLN
INTRAMUSCULAR | Status: AC
  Filled 2017-12-19: qty 2

## 2017-12-19 MED ORDER — LACTATED RINGERS IV SOLN
INTRAVENOUS | Status: DC
  Administered 2017-12-19: 1000 mL via INTRAVENOUS

## 2017-12-19 MED ORDER — PROPOFOL 10 MG/ML IV BOLUS
INTRAVENOUS | Status: AC
  Filled 2017-12-19: qty 60

## 2017-12-19 MED ORDER — PROPOFOL 10 MG/ML IV BOLUS
INTRAVENOUS | Status: DC | PRN
  Administered 2017-12-19 (×2): 10 mg via INTRAVENOUS
  Administered 2017-12-19: 20 mg via INTRAVENOUS
  Administered 2017-12-19 (×4): 10 mg via INTRAVENOUS

## 2017-12-19 MED ORDER — SODIUM CHLORIDE 0.9 % IV SOLN: INTRAVENOUS | Status: DC

## 2017-12-19 MED ORDER — PROPOFOL 500 MG/50ML IV EMUL
INTRAVENOUS | Status: DC | PRN
  Administered 2017-12-19: 125 ug/kg/min via INTRAVENOUS

## 2017-12-19 MED ORDER — MIDAZOLAM HCL 5 MG/5ML IJ SOLN
INTRAMUSCULAR | Status: DC | PRN
  Administered 2017-12-19: 1 mg via INTRAVENOUS

## 2017-12-19 MED FILL — OXYCODONE-ACETAMINOPHEN 5-3: 5-325 | 30 days supply | Qty: 60 | Fill #0

## 2017-12-19 NOTE — Anesthesia Preprocedure Evaluation (Signed)
Anesthesia Evaluation  Patient identified by MRN, date of birth, ID band Patient awake    Reviewed: Allergy & Precautions, H&P , NPO status , Patient's Chart, lab work & pertinent test results  History of Anesthesia Complications Negative for: history of anesthetic complications  Airway Mallampati: II  TM Distance: >3 FB Neck ROM: Full    Dental  (+) Chipped   Pulmonary former smoker,    Pulmonary exam normal breath sounds clear to auscultation       Cardiovascular hypertension, Pt. on home beta blockers + CAD and + Peripheral Vascular Disease  negative cardio ROS Normal cardiovascular exam Rhythm:Regular Rate:Normal     Neuro/Psych CVA, Residual Symptoms negative psych ROS   GI/Hepatic Neg liver ROS, GERD  Medicated,  Endo/Other  diabetes  Renal/GU negative Renal ROS  negative genitourinary   Musculoskeletal  (+) Arthritis , Osteoarthritis,    Abdominal Normal abdominal exam  (+)   Peds negative pediatric ROS (+)  Hematology negative hematology ROS (+)   Anesthesia Other Findings  Duplex [id number]  LICA 71-16%, bilateral ECA >50%,  patent RICA post intervention hx acute high grade stenosis RICA >80% 02-22-2009 in setting of  stroke  s/p  right carotid endarterectomy 02-28-2009/     Reproductive/Obstetrics negative OB ROS                             Anesthesia Physical  Anesthesia Plan  ASA: III  Anesthesia Plan: MAC   Post-op Pain Management:    Induction: Intravenous  PONV Risk Score and Plan: 1  Airway Management Planned: Natural Airway, Nasal Cannula and Simple Face Mask  Additional Equipment:   Intra-op Plan:   Post-operative Plan: Extubation in OR  Informed Consent: I have reviewed the patients History and Physical, chart, labs and discussed the procedure including the risks, benefits and alternatives for the proposed anesthesia with the patient or authorized  representative who has indicated his/her understanding and acceptance.     Plan Discussed with: CRNA  Anesthesia Plan Comments:         Anesthesia Quick Evaluation

## 2017-12-19 NOTE — Op Note (Signed)
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 Estimated Blood Loss:     Estimated blood loss: none. Procedure:                Pre-Anesthesia Assessment:                           - Prior to the procedure, a History and Physical                            was performed, and patient medications and                            allergies were reviewed. The patient's tolerance of                            previous anesthesia was also reviewed. The risks                            and benefits of the procedure and the sedation                            options and risks were discussed with the patient.                            All questions were answered, and informed consent                            was obtained. Prior Anticoagulants: The patient has                            taken Plavix (clopidogrel), last dose was 5 days                            prior to procedure. ASA Grade Assessment: II - A                            patient with mild systemic disease. After reviewing  the risks and benefits, the patient was deemed in                            satisfactory condition to undergo the procedure.                           - Sedation was administered by an anesthesia                            professional. Deep sedation was attained.                           After obtaining informed consent, the endoscope was   passed under direct vision. Throughout the                            procedure, the patient's blood pressure, pulse, and                            oxygen saturations were monitored continuously. The                            [id number]) Olympus Linear EUS was                            introduced through the mouth, and advanced to the                            second part of duodenum. The upper EUS was                            accomplished without difficulty. The patient                            tolerated the procedure well. Scope In: Scope Out: Findings:      ENDOSONOGRAPHIC FINDING: :      An oval mass was identified in the pancreatic body. The mass was       hypoechoic. The mass measured 20 mm by 30 mm in maximal cross-sectional       diameter. The outer margins were irregular. An intact interface was seen       between the mass and the adjacent structures suggesting a lack of       invasion. The remainder of the pancreas was examined. The       endosonographic appearance of parenchyma and the upstream pancreatic       duct indicated a maximum duct diameter of 7 mm. Fine needle aspiration       for cytology was performed. Color Doppler imaging was utilized prior to       needle puncture to confirm a lack of significant vascular structures       within the needle path. Five passes were made with the 25 gauge needle       using a transgastric approach. A stylet was used. A cytotechnologist was       present to evaluate the adequacy of the specimen. The cellularity of the  specimen was adequate. Final cytology results are pending.      In the body of the pancreas there was a large 20 x 30 mm, approximately,       hypoechoic mass. It had irregular margin, but it was round/oval in       appearance. There was pancreatic tail atrophy, but the PD was dilated up       to 7.6 mm. The celiac axis was clearly viewed and there was no evidence       of invasion. In the head of  the pancreas a 1 cm lymph node was       identified and the pancreas was lobular in the head and uncinate       process. The left adrenal was clearly identified and it was enlarged,       but there was no evidence of any masses. Impression:               - A mass was identified in the pancreatic body.                            Fine needle aspiration performed. Moderate Sedation:      N/A- Per Anesthesia Care Recommendation:           - Patient has a contact number available for                            emergencies. The signs and symptoms of potential                            delayed complications were discussed with the                            patient. Return to normal activities tomorrow.                            Written discharge instructions were provided to the                            patient.                           - Resume regular diet.                           - Await cytology results. Procedure Code(s):        --- Professional ---                           [phone], Esophagogastroduodenoscopy, flexible,                            transoral; with transendoscopic ultrasound-guided                            intramural or transmural fine needle                            aspiration/biopsy(s), (includes endoscopic  ultrasound examination limited to the esophagus,                            stomach or duodenum, and adjacent structures) Diagnosis Code(s):        --- Professional ---                           K86.89, Other specified diseases of pancreas CPT copyright 2017 [employer]. All rights reserved. The codes documented in this report are preliminary and upon coder review may  be revised to meet current compliance requirements. Carol Ada, MD Carol Ada, MD 12/19/2017 12:58:07 PM This report has been signed electronically. Number of Addenda: 0

## 2017-12-19 NOTE — H&P (Signed)
Michael Maddox HPI: The patient complains about weight loss, abdominal pain, and constipation. He reports that his symptom started 1.5-2 months ago. It was gradually worsening over the time period he he has lost approximately 30 lbs. There is an associated epigastric to mid abdominal pain. He feels that if he eats he will have constipation. As a result of his symptoms a CT scan was ordered by Dr. Jeanie Cooks and there is a 3.7 x 2.9 cm mass found in the pancreatic body. It is associated with PD dilation as a result of a cut off sign. Two small lesions were noted in the liver. There is no history of pancreatic cancer in his family.  Past Medical History:  Diagnosis Date  . Arthritis   . Carotid artery disease ([place]) followed by dr berry--- per last duplex 01-60-1093  LICA 23-55%, bilateral ECA >50%,  patent RICA post intervention   hx acute high grade stenosis RICA >80% 02-22-2009 in setting of  stroke  s/p  right carotid endarterectomy 02-28-2009/    . Claudication of both lower extremities ([place])    due to PAD ----  right > left  . COPD (chronic obstructive pulmonary disease) ([place])   . Coronary artery disease CARDIOLOGIST-  DR BERRY   hx positive myoview 07-29-2013;  08-16-2013 per cardiac cath, severe 3V CAD w/ 60% subclavian stenosis;  08-23-2013   s/p  CABG x3  (LIMA to LAD, SVG to OM1, SVG to PDA)  . CVA (cerebral vascular accident) ([place]) 02/22/2009   acute infarct right hemisphere w/ severe RICA stentosis----  per pt no residuals  . GERD (gastroesophageal reflux disease)   . HTN (hypertension)   . Hyperlipidemia   . Hyperplasia of prostate with lower urinary tract symptoms (LUTS)   . Peripheral arterial disease ([place]) followed by dr berry--- last dobbler ABIs bilateral SFA occlusions (pt is scheduled for intervention 05-08-2017   hx PTA and stenting to bilateral SFA 2002 and PTA w/ stenting left common iliac artery and for in-stent restenosis 2003 Left SFA and 2005  stenting right SFA  for in-stent restenosis  . Prostate cancer [facility]) urologist-  dr ottelin/  oncologist-  dr Tammi Klippel   dx 11-20-2016-- Stage T1c, Gleason 4+3, PSA 5.73, vol 35.95cc--- scheduled for radioactive seed implants 04-11-2017  . 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 S/P peripheral artery angioplasty with stent placement    bilateral SFA 2002;  in-stenosis bilateral SFA  (right 2003, left 2005) and left CIA 2003  . Shortness of breath   . Type II diabetes mellitus ([place])   . Weak urinary stream   . Wears glasses     Past Surgical History:  Procedure Laterality Date  . ABDOMINAL AORTOGRAM N/A 05/08/2017   Procedure: ABDOMINAL AORTOGRAM;  Surgeon: Lorretta Harp, MD;  Location: [place] [facility];  Service: Cardiovascular;  Laterality: N/A;  . CARDIAC CATHETERIZATION  12-01-2000    dr berry    non-critial CAD & nonischemic cardiolite-- mLAD 50%, mLCx 70%, prox. to mid RCA 75% focal midsegment 80-85%  . CARDIOVASCULAR STRESS TEST  07-29-2013    dr berry   High risk nuclear study w/ inferoseptal ischemia/  normal LV function and wall motion,  nuclear study ef 52%  . CAROTID ENDARTERECTOMY Right 02-28-2009    dr c. Scot Dock  [facility]   patch angioplasty  . COLONOSCOPY    . CORONARY ARTERY BYPASS GRAFT N/A 08/23/2013   Procedure: CORONARY ARTERY  BYPASS GRAFTING (CABG);  Surgeon: Melrose Nakayama, MD;  Location: [place];  Service: Open Heart Surgery;  Laterality: N/A;  Coronary Artery Bypass graft times three using left internal mammary artery and left leg saphenous vein   . CYSTOSCOPY N/A 04/11/2017   Procedure: CYSTOSCOPY FLEXIBLE;  Surgeon: Kathie Rhodes, MD;  Location: [facility];  Service: Urology;  Laterality: N/A;  no seeds found in bladder  . ENDOVASCULAR STENT INSERTION Bilateral right 12-25-2000 ;  left 03-02-2001   dr berry   PTA and stenting to bilateral SFA  . ENDOVASCULAR STENT INSERTION Bilateral right 08-30-2003 ;  left 11-10-2001    dr berry   PTA  and stenting for in-stent restenosis right SFA 08-30-2003:  PTA and stenting distal left common iliac artery and in-stent restenosis left SFA 11-10-2001  . INTRAOPERATIVE TRANSESOPHAGEAL ECHOCARDIOGRAM N/A 08/23/2013   Procedure: INTRAOPERATIVE TRANSESOPHAGEAL ECHOCARDIOGRAM;  Surgeon: Melrose Nakayama, MD;  Location: [place];  Service: Open Heart Surgery;  Laterality: N/A;  . LEFT HEART CATHETERIZATION WITH CORONARY ANGIOGRAM N/A 08/16/2013   Procedure: LEFT HEART CATHETERIZATION WITH CORONARY ANGIOGRAM;  Surgeon: Lorretta Harp, MD;  Location: [facility] CATH LAB;  Service: Cardiovascular;  Laterality: N/A;   severe 3V CAD and 60% subclavian sternosis  . LOWER EXTREMITY ANGIOGRAM N/A 08/16/2013   Procedure: LOWER EXTREMITY ANGIOGRAM;  Surgeon: Lorretta Harp, MD;  Location: [facility] CATH LAB;  Service: Cardiovascular;  Laterality: N/A;  . LOWER EXTREMITY ANGIOGRAPHY Bilateral 05/08/2017   Procedure: Lower Extremity Angiography;  Surgeon: Lorretta Harp, MD;  Location: [place] [facility];  Service: Cardiovascular;  Laterality: Bilateral;  . PERIPHERAL VASCULAR BALLOON ANGIOPLASTY Left 05/08/2017   Procedure: PERIPHERAL VASCULAR BALLOON ANGIOPLASTY;  Surgeon: Lorretta Harp, MD;  Location: [place] [facility];  Service: Cardiovascular;  Laterality: Left;  COMMON ILIAC  . RADIOACTIVE SEED IMPLANT N/A 04/11/2017   Procedure: RADIOACTIVE SEED IMPLANT/BRACHYTHERAPY IMPLANT;  Surgeon: Kathie Rhodes, MD;  Location: [facility];  Service: Urology;  Laterality: N/A;    67   seeds implanted  . SPACE OAR INSTILLATION N/A 04/11/2017   Procedure: SPACE OAR INSTILLATION;  Surgeon: Kathie Rhodes, MD;  Location: [facility];  Service: Urology;  Laterality: N/A;  . TRANSTHORACIC ECHOCARDIOGRAM  07/15/2013   ef 09-47%, grade 1 diastolic dysfunction/  trivial MR, mild to moderate PR, mild TR     Family History  Problem Relation Age of Onset  . Diabetes Mother   . Heart disease Father      Social History:  reports that he quit smoking about 4 years ago. His smoking use included cigarettes. He has a 35.00 pack-year smoking history. He has never used smokeless tobacco. He reports that he drinks about 4.0 standard drinks of alcohol per week. He reports that he does not use drugs.  Allergies: No Known Allergies  Medications:  Scheduled:  Continuous: . sodium chloride    . lactated ringers 1,000 mL (12/19/17 1108)    Results for orders placed or performed during the hospital encounter of 12/19/17 (from the past 24 hour(s))  Glucose, capillary     Status: Abnormal   Collection Time: 12/19/17 11:06 AM  Result Value Ref Range   Glucose-Capillary 151 (H) 70 - 99 mg/dL     No results found.  ROS:  As stated above in the HPI otherwise negative.  Blood pressure (!) 163/56, pulse 61, temperature 98.2 F (36.8 C), temperature source Oral, resp. rate 11, height 5\' 11"  (1.803 m), weight 56.7  kg, SpO2 99 %.    PE: Gen: NAD, Alert and Oriented HEENT:  Michael Maddox, EOMI Neck: Supple, no LAD Lungs: CTA Bilaterally CV: RRR without M/G/R ABM: Soft, upper abdominal discomfort, +BS Ext: No C/C/E  Assessment/Plan: 1) Pancreatic mass - EUS with FNA  Michael Maddox 12/19/2017, 11:47 AM

## 2017-12-19 NOTE — Discharge Instructions (Signed)

## 2017-12-19 NOTE — Transfer of Care (Signed)
Immediate Anesthesia Transfer of Care Note  Patient: Michael Maddox  Procedure(s) Performed: UPPER ESOPHAGEAL ENDOSCOPIC ULTRASOUND (EUS) (N/A ) FINE NEEDLE ASPIRATION (FNA) LINEAR (N/A )  Patient Location: PACU and Endoscopy Unit  Anesthesia Type:MAC  Level of Consciousness: awake, oriented and drowsy  Airway & Oxygen Therapy: Patient Spontanous Breathing and Patient connected to nasal cannula oxygen  Post-op Assessment: Report given to RN and Post -op Vital signs reviewed and stable  Post vital signs: Reviewed and stable  Last Vitals:  Vitals Value Taken Time  BP 129/76 12/19/2017 12:54 PM  Temp    Pulse 56 12/19/2017 12:55 PM  Resp 14 12/19/2017 12:55 PM  SpO2 100 % 12/19/2017 12:55 PM  Vitals shown include unvalidated device data.  Last Pain:  Vitals:   12/19/17 1042  TempSrc: Oral  PainSc: 0-No pain         Complications: No apparent anesthesia complications

## 2017-12-19 NOTE — Anesthesia Postprocedure Evaluation (Signed)
Anesthesia Post Note  Patient: TEMESGEN WEIGHTMAN  Procedure(s) Performed: UPPER ESOPHAGEAL ENDOSCOPIC ULTRASOUND (EUS) (N/A ) FINE NEEDLE ASPIRATION (FNA) LINEAR (N/A )     Patient location during evaluation: Endoscopy Anesthesia Type: MAC Level of consciousness: sedated Pain management: pain level controlled Vital Signs Assessment: post-procedure vital signs reviewed and stable Respiratory status: spontaneous breathing Cardiovascular status: stable Postop Assessment: no apparent nausea or vomiting Anesthetic complications: no    Last Vitals:  Vitals:   12/19/17 1042 12/19/17 1256  BP: (!) 163/56 129/76  Pulse: 61 (!) 56  Resp: 11 14  Temp: 36.8 C 36.4 C  SpO2: 99% 100%    Last Pain:  Vitals:   12/19/17 1256  TempSrc:   PainSc: 0-No pain   Pain Goal:                 Elania Crowl JR,JOHN Dawnette Mione

## 2017-12-23 ENCOUNTER — Encounter (HOSPITAL_COMMUNITY): Payer: Self-pay | Admitting: Gastroenterology

## 2017-12-26 ENCOUNTER — Telehealth: Payer: Self-pay | Admitting: Nurse Practitioner

## 2017-12-26 NOTE — Telephone Encounter (Signed)
Pt has been scheduled to see Wilnette Kales on 9/10 at 130pm. Pt aware to arrive 30 minutes early. Pt has been provided with the location.

## 2017-12-30 ENCOUNTER — Encounter: Payer: Self-pay | Admitting: Nurse Practitioner

## 2017-12-30 ENCOUNTER — Inpatient Hospital Stay: Payer: Medicare Other

## 2017-12-30 ENCOUNTER — Telehealth: Payer: Self-pay | Admitting: Nurse Practitioner

## 2017-12-30 ENCOUNTER — Inpatient Hospital Stay: Payer: Medicare Other | Attending: Nurse Practitioner | Admitting: Nurse Practitioner

## 2017-12-30 VITALS — BP 157/65 | HR 82 | Temp 98.1°F | Resp 18 | Ht 71.0 in | Wt 129.2 lb

## 2017-12-30 DIAGNOSIS — I10 Essential (primary) hypertension: Secondary | ICD-10-CM | POA: Insufficient documentation

## 2017-12-30 DIAGNOSIS — F1021 Alcohol dependence, in remission: Secondary | ICD-10-CM | POA: Diagnosis not present

## 2017-12-30 DIAGNOSIS — C251 Malignant neoplasm of body of pancreas: Secondary | ICD-10-CM

## 2017-12-30 DIAGNOSIS — J449 Chronic obstructive pulmonary disease, unspecified: Secondary | ICD-10-CM | POA: Insufficient documentation

## 2017-12-30 DIAGNOSIS — Z7902 Long term (current) use of antithrombotics/antiplatelets: Secondary | ICD-10-CM | POA: Insufficient documentation

## 2017-12-30 DIAGNOSIS — E785 Hyperlipidemia, unspecified: Secondary | ICD-10-CM

## 2017-12-30 DIAGNOSIS — Z79899 Other long term (current) drug therapy: Secondary | ICD-10-CM | POA: Diagnosis not present

## 2017-12-30 DIAGNOSIS — Z8673 Personal history of transient ischemic attack (TIA), and cerebral infarction without residual deficits: Secondary | ICD-10-CM | POA: Diagnosis not present

## 2017-12-30 DIAGNOSIS — Z8546 Personal history of malignant neoplasm of prostate: Secondary | ICD-10-CM | POA: Diagnosis not present

## 2017-12-30 DIAGNOSIS — I2510 Atherosclerotic heart disease of native coronary artery without angina pectoris: Secondary | ICD-10-CM | POA: Diagnosis not present

## 2017-12-30 DIAGNOSIS — D490 Neoplasm of unspecified behavior of digestive system: Secondary | ICD-10-CM

## 2017-12-30 DIAGNOSIS — E119 Type 2 diabetes mellitus without complications: Secondary | ICD-10-CM | POA: Diagnosis not present

## 2017-12-30 DIAGNOSIS — Z87891 Personal history of nicotine dependence: Secondary | ICD-10-CM | POA: Insufficient documentation

## 2017-12-30 LAB — CMP (CANCER CENTER ONLY)
ALK PHOS: 49 U/L (ref 38–126)
ALT: 24 U/L (ref 0–44)
ANION GAP: 11 (ref 5–15)
AST: 19 U/L (ref 15–41)
Albumin: 4.6 g/dL (ref 3.5–5.0)
BUN: 12 mg/dL (ref 8–23)
CALCIUM: 10.3 mg/dL (ref 8.9–10.3)
CHLORIDE: 100 mmol/L (ref 98–111)
CO2: 28 mmol/L (ref 22–32)
Creatinine: 0.92 mg/dL (ref 0.61–1.24)
GFR, Estimated: >60 mL/min (ref >60)
Glucose, Bld: 166 mg/dL — ABNORMAL HIGH (ref 70–99)
Potassium: 4.6 mmol/L (ref 3.5–5.1)
SODIUM: 139 mmol/L (ref 135–145)
Total Bilirubin: 0.7 mg/dL (ref 0.3–1.2)
Total Protein: 7.6 g/dL (ref 6.5–8.1)

## 2017-12-30 LAB — CBC WITH DIFFERENTIAL (CANCER CENTER ONLY)
Basophils Absolute: 0 10*3/uL (ref 0.0–0.1)
Basophils Relative: 1 %
Eosinophils Absolute: 0 10*3/uL (ref 0.0–0.5)
Eosinophils Relative: 1 %
HEMATOCRIT: 38.4 % (ref 38.4–49.9)
HEMOGLOBIN: 12.6 g/dL — AB (ref 13.0–17.1)
Lymphocytes Relative: 26 %
Lymphs Abs: 1 10*3/uL (ref 0.9–3.3)
MCH: 29.4 pg (ref 27.2–33.4)
MCHC: 32.9 g/dL (ref 32.0–36.0)
MCV: 89.2 fL (ref 79.3–98.0)
MONOS PCT: 12 %
Monocytes Absolute: 0.4 10*3/uL (ref 0.1–0.9)
NEUTROS ABS: 2.3 10*3/uL (ref 1.5–6.5)
NEUTROS PCT: 60 %
PLATELETS: 225 10*3/uL (ref 140–400)
RBC: 4.3 MIL/uL (ref 4.20–5.82)
RDW: 12.8 % (ref 11.0–14.6)
WBC Count: 3.8 10*3/uL — ABNORMAL LOW (ref 4.0–10.3)

## 2017-12-30 NOTE — Progress Notes (Addendum)
[place]  [phone] [phone]  Clinic New Consult Note   Patient Care Team: Nolene Ebbs, MD as PCP - General (Internal Medicine) Lorretta Harp, MD as PCP - Cardiology (Cardiology) 12/30/2017  CHIEF COMPLAINTS/PURPOSE OF CONSULTATION:  Adenocarcinoma of pancreas   SUMMARY OF ONCOLOGY HISTORY    Malignant neoplasm of body of pancreas ([place])   12/11/2017 Imaging    CT AP WITH CONTRAST IMPRESSION: Focal area of abnormal low attenuation in the pancreatic body measuring 3.7 x 2.9 cm with cut off of the duct most concerning for primary pancreatic neoplasm.  2 small low-attenuation foci identified within the liver which are nonspecific however could represent metastatic disease; one is 1.0 cm complex lesion within the hepatic dome, the other area is a low-attenuation focus adjacent to the falciform ligament likely representing a perfusion anomaly or possible local fatty infiltration  Extensive atherosclerotic changes in the abdominal aorta, iliac vessels, splenic vessels.  Occlusion of the right and possibly left iliac.  Suspected chronic re-constitution of flow identified within the femoral vessels bilaterally.  Prostate brachytherapy consistent with history of malignancy  No evidence of small bowel obstruction.  Findings suggestive of constipation     12/19/2017 Procedure    EUS biopsy: In the body of the pancreas there was a large 20 x 30 mm, approximately, hypoechoic mass. It had irregular margin, but it was round/oval in appearance. There was pancreatic tail atrophy, but the PD was dilated up to 7.6 mm. The celiac axis was clearly viewed and there was no evidence of invasion. In the head of the pancreas a 1 cm lymph node was identified and the pancreas was lobular in the head and uncinate process. The left adrenal was clearly identified and it was enlarged, but there was no evidence of any masses.    12/19/2017 Initial Biopsy     Diagnosis FINE NEEDLE ASPIRATION,ENDOSCOPIC, PANCREAS BODY (SPECIMEN 1 OF 1 COLLECTED 12/19/17): MALIGNANT CELLS CONSISTENT WITH ADENOCARCINOMA.    12/19/2017 Initial Diagnosis    Malignant neoplasm of body of pancreas ([place])    HISTORY OF PRESENTING ILLNESS:  Michael Maddox 67 y.o. male is here at the request of Dr. Carol Ada. He presented to PCP Dr. Jeanie Cooks c/o 1.5 - 2 month history of abdominal pain, constipation, and 30 lbs weight loss that gradually worsened over time. There was associated epigastric pain. He underwent CT AP with contrast on 12/11/17 that showed focal area of enlargement involving the pancreatic body measuring approximately 3.7 x 2.9 cm concerning for primary pancreatic neoplasm. Two small lesions were noted in the liver which are indeterminate.  He underwent EUS biopsy on 12/19/2017 per Dr. Benson Norway which showed a large 20 x 30 mm hypoechoic mass in the body of the pancreas with irregular margins.  There was pancreatic tail atrophy and pancreatic duct dilatation up to 7.6 mm.  In the head of the pancreas a 1 cm lymph node was identified.  Biopsy of the pancreas body mass was positive for malignant cells consistent with adenocarcinoma.   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 Tammi Klippel on 04/11/18, followed by Dr. Karsten Ro. HTN, HL, TIA in 2010 s/p endarterectomy, CAD s/p CABG in 2015, PVD requiring revascularization and stenting in the LEs and on anticoagulation with plavix and aspirin; followed by cardiologist Dr. [name]. He is recently diagnosed with DM on metformin, most recent BG 151, and A1c "  6.?"   Socially, he He is a former smoker 1 PPD x35 years, quit in 2015; denies drug use or current alcohol use. He quit drinking 2 months ago and before that he drank 1-2 drinks per week. 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 Drives himself and is mostly independent with  ADLs. He previously was not very physically active due to claudication.  He has family history of breast cancer in his mother diagnosed at age 27. Denies other breast, gyn, or colon cancers. He has 3 children who are healthy.   Today, he reports mild fatigue and decreased appetite that began after his prostate cancer treatment that has gradually worsened over 3-4 months. He has lost 30 lbs, drinks glucerna occasionally. He has occasional post-prandial emesis and constant band-like pain across his abdomen, he rates 5/10. He has taken percocet which he does not find very helpful. He has constipation, but when he does have BM it is light colored and floats, with found odor. Denies dark urine, juandice, fever, chills, diarrhea, cough, chest pain, dyspnea, or leg edema.   MEDICAL HISTORY:  Past Medical History:  Diagnosis Date  . Arthritis   . Carotid artery disease ([place]) followed by dr berry--- per last duplex [id number]  LICA 02-72%, bilateral ECA >50%,  patent RICA post intervention   hx acute high grade stenosis RICA >80% 02-22-2009 in setting of  stroke  s/p  right carotid endarterectomy 02-28-2009/    . Claudication of both lower extremities ([place])    due to PAD ----  right > left  . COPD (chronic obstructive pulmonary disease) ([place])   . Coronary artery disease CARDIOLOGIST-  DR BERRY   hx positive myoview 07-29-2013;  08-16-2013 per cardiac cath, severe 3V CAD w/ 60% subclavian stenosis;  08-23-2013   s/p  CABG x3  (LIMA to LAD, SVG to OM1, SVG to PDA)  . CVA (cerebral vascular accident) ([place]) 02/22/2009   acute infarct right hemisphere w/ severe RICA stentosis----  per pt no residuals  . GERD (gastroesophageal reflux disease)   . HTN (hypertension)   . Hyperlipidemia   . Hyperplasia of prostate with lower urinary tract symptoms (LUTS)   . Peripheral arterial disease ([place]) followed by dr berry--- last dobbler ABIs bilateral SFA occlusions (pt is scheduled for intervention 05-08-2017   hx PTA  and stenting to bilateral SFA 2002 and PTA w/ stenting left common iliac artery and for in-stent restenosis 2003 Left SFA and 2005  stenting right SFA for in-stent restenosis  . Prostate cancer [facility]) urologist-  dr ottelin/  oncologist-  dr Tammi Klippel   dx 11-20-2016-- Stage T1c, Gleason 4+3, PSA 5.73, vol 35.95cc--- scheduled for radioactive seed implants 04-11-2017  . 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 S/P peripheral artery angioplasty with stent placement    bilateral SFA 2002;  in-stenosis bilateral SFA  (right 2003, left 2005) and left CIA 2003  . Shortness of breath   . Type II diabetes mellitus ([place])   . Weak urinary stream   . Wears glasses     SURGICAL HISTORY: Past Surgical History:  Procedure Laterality Date  . ABDOMINAL AORTOGRAM N/A 05/08/2017   Procedure: ABDOMINAL AORTOGRAM;  Surgeon: Lorretta Harp, MD;  Location: [place] [facility];  Service: Cardiovascular;  Laterality: N/A;  . CARDIAC CATHETERIZATION  12-01-2000    dr berry    non-critial CAD & nonischemic cardiolite-- mLAD 50%, mLCx 70%, prox. to mid  RCA 75% focal midsegment 80-85%  . CARDIOVASCULAR STRESS TEST  07-29-2013    dr berry   High risk nuclear study w/ inferoseptal ischemia/  normal LV function and wall motion,  nuclear study ef 52%  . CAROTID ENDARTERECTOMY Right 02-28-2009    dr c. Scot Dock  [facility]   patch angioplasty  . COLONOSCOPY    . CORONARY ARTERY BYPASS GRAFT N/A 08/23/2013   Procedure: CORONARY ARTERY BYPASS GRAFTING (CABG);  Surgeon: Melrose Nakayama, MD;  Location: [facility];  Service: Open Heart Surgery;  Laterality: N/A;  Coronary Artery Bypass graft times three using left internal mammary artery and left leg saphenous vein   . CYSTOSCOPY N/A 04/11/2017   Procedure: CYSTOSCOPY FLEXIBLE;  Surgeon: Kathie Rhodes, MD;  Location: [facility];  Service: Urology;  Laterality: N/A;  no seeds found in bladder  . ENDOVASCULAR STENT INSERTION Bilateral right  12-25-2000 ;  left 03-02-2001   dr berry   PTA and stenting to bilateral SFA  . ENDOVASCULAR STENT INSERTION Bilateral right 08-30-2003 ;  left 11-10-2001    dr berry   PTA and stenting for in-stent restenosis right SFA 08-30-2003:  PTA and stenting distal left common iliac artery and in-stent restenosis left SFA 11-10-2001  . FINE NEEDLE ASPIRATION N/A 12/19/2017   Procedure: FINE NEEDLE ASPIRATION (FNA) LINEAR;  Surgeon: Carol Ada, MD;  Location: [facility];  Service: Endoscopy;  Laterality: N/A;  . INTRAOPERATIVE TRANSESOPHAGEAL ECHOCARDIOGRAM N/A 08/23/2013   Procedure: INTRAOPERATIVE TRANSESOPHAGEAL ECHOCARDIOGRAM;  Surgeon: Melrose Nakayama, MD;  Location: [facility];  Service: Open Heart Surgery;  Laterality: N/A;  . LEFT HEART CATHETERIZATION WITH CORONARY ANGIOGRAM N/A 08/16/2013   Procedure: LEFT HEART CATHETERIZATION WITH CORONARY ANGIOGRAM;  Surgeon: Lorretta Harp, MD;  Location: [facility] CATH LAB;  Service: Cardiovascular;  Laterality: N/A;   severe 3V CAD and 60% subclavian sternosis  . LOWER EXTREMITY ANGIOGRAM N/A 08/16/2013   Procedure: LOWER EXTREMITY ANGIOGRAM;  Surgeon: Lorretta Harp, MD;  Location: [facility] CATH LAB;  Service: Cardiovascular;  Laterality: N/A;  . LOWER EXTREMITY ANGIOGRAPHY Bilateral 05/08/2017   Procedure: Lower Extremity Angiography;  Surgeon: Lorretta Harp, MD;  Location: [place] [facility];  Service: Cardiovascular;  Laterality: Bilateral;  . PERIPHERAL VASCULAR BALLOON ANGIOPLASTY Left 05/08/2017   Procedure: PERIPHERAL VASCULAR BALLOON ANGIOPLASTY;  Surgeon: Lorretta Harp, MD;  Location: [place] [facility];  Service: Cardiovascular;  Laterality: Left;  COMMON ILIAC  . RADIOACTIVE SEED IMPLANT N/A 04/11/2017   Procedure: RADIOACTIVE SEED IMPLANT/BRACHYTHERAPY IMPLANT;  Surgeon: Kathie Rhodes, MD;  Location: [facility];  Service: Urology;  Laterality: N/A;    67   seeds implanted  . SPACE OAR INSTILLATION N/A 04/11/2017   Procedure: SPACE  OAR INSTILLATION;  Surgeon: Kathie Rhodes, MD;  Location: [facility];  Service: Urology;  Laterality: N/A;  . TRANSTHORACIC ECHOCARDIOGRAM  07/15/2013   ef 92-11%, grade 1 diastolic dysfunction/  trivial MR, mild to moderate PR, mild TR   . UPPER ESOPHAGEAL ENDOSCOPIC ULTRASOUND (EUS) N/A 12/19/2017   Procedure: UPPER ESOPHAGEAL ENDOSCOPIC ULTRASOUND (EUS);  Surgeon: Carol Ada, MD;  Location: [facility];  Service: Endoscopy;  Laterality: N/A;    SOCIAL HISTORY: Social History   Socioeconomic History  . Marital status: Married    Spouse name: Not on file  . Number of children: 3  . Years of education: Not on file  . Highest education level: Not on file  Occupational History  . Not on file  Social Needs  .  Financial resource strain: Not on file  . Food insecurity:    Worry: Not on file    Inability: Not on file  . Transportation needs:    Medical: Not on file    Non-medical: Not on file  Tobacco Use  . Smoking status: Former Smoker    Packs/day: 1.00    Years: 35.00    Pack years: 35.00    Types: Cigarettes    Last attempt to quit: 05/04/2013    Years since quitting: 4.6  . Smokeless tobacco: Never Used  Substance and Sexual Activity  . Alcohol use: Yes    Alcohol/week: 4.0 standard drinks    Types: 4 Standard drinks or equivalent per week    Comment: none in 2 months, previously 1-2 drinks per week   . Drug use: No  . Sexual activity: Not on file  Lifestyle  . Physical activity:    Days per week: Not on file    Minutes per session: Not on file  . Stress: Not on file  Relationships  . Social connections:    Talks on phone: Not on file    Gets together: Not on file    Attends religious service: Not on file    Active member of club or organization: Not on file    Attends meetings of clubs or organizations: Not on file    Relationship status: Not on file  . Intimate partner violence:    Fear of current or ex partner: Not on file    Emotionally  abused: Not on file    Physically abused: Not on file    Forced sexual activity: Not on file  Other Topics Concern  . Not on file  Social History Narrative  . Not on file    FAMILY HISTORY: Family History  Problem Relation Age of Onset  . Diabetes Mother   . Heart disease Father     ALLERGIES:  has No Known Allergies.  MEDICATIONS:  Current Outpatient Medications  Medication Sig Dispense Refill  . aspirin 81 MG chewable tablet Chew 1 tablet (81 mg total) by mouth daily. (Patient taking differently: Chew 81 mg by mouth at bedtime. )    . cilostazol (PLETAL) 50 MG tablet Take 1 tablet (50 mg total) by mouth 2 (two) times daily. 60 tablet 6  . clopidogrel (PLAVIX) 75 MG tablet Take 75 mg by mouth at bedtime.     Marland Kitchen dexlansoprazole (DEXILANT) 60 MG capsule Take 60 mg by mouth daily as needed (for reflux.).     Marland Kitchen hydrocortisone 2.5 % ointment Apply 1 application topically 2 (two) times daily as needed (for skin irritation).   0  . metFORMIN (GLUCOPHAGE) 500 MG tablet Take 500 mg by mouth at bedtime.   2  . metoprolol succinate (TOPROL-XL) 25 MG 24 hr tablet Take 25 mg by mouth at bedtime.     . Pseudoeph-Doxylamine-DM-APAP (NYQUIL PO) Take 2 capsules by mouth at bedtime as needed (FOR COUGH AND COLD).    Marland Kitchen simvastatin (ZOCOR) 20 MG tablet Take 20 mg by mouth at bedtime.     . tamsulosin (FLOMAX) 0.4 MG CAPS capsule Take 0.4 mg by mouth every evening.  11   No current facility-administered medications for this visit.     REVIEW OF SYSTEMS:   Constitutional: Denies fevers, chills or abnormal night sweats (+) decreased appetite (+) fatigue (+) weight loss 30 lbs  Ears, nose, mouth, throat, and face: Denies mucositis or sore throat Respiratory: Denies cough, dyspnea or  wheezes Cardiovascular: Denies palpitation, chest discomfort or lower extremity swelling (+) PVD Gastrointestinal:  Denies nausea, diarrhea, heartburn (+) light malodorous stools (+) band-like abd pain (+) occasional post  prandial emesis  Skin: Denies abnormal skin rashes Lymphatics: Denies new lymphadenopathy or easy bruising Neurological:Denies numbness, tingling or new weaknesses Behavioral/Psych: Mood is stable, no new changes  All other systems were reviewed with the patient and are negative.  PHYSICAL EXAMINATION: ECOG PERFORMANCE STATUS: 1 - Symptomatic but completely ambulatory  Vitals:   12/30/17 1337  BP: (!) 157/65  Pulse: 82  Resp: 18  Temp: 98.1 F (36.7 C)  SpO2: 100%   Filed Weights   12/30/17 1337  Weight: 129 lb 3.2 oz (58.6 kg)    GENERAL: alert, thin male in no distress and comfortable SKIN: skin color, texture, turgor are normal, no rashes or significant lesions EYES: sclera anicteric  OROPHARYNX:no thrush or ulcers LYMPH:  no palpable cervical, supraclavicular, or axillary lymphadenopathy  LUNGS: distant breath sounds with normal breathing effort HEART: regular rate & rhythm, no lower extremity edema ABDOMEN:abdomen soft and normal bowel sounds. Mild tenderness to epigastrium  Musculoskeletal:no cyanosis of digits and no clubbing  PSYCH: alert & oriented x 3 with fluent speech NEURO: no focal motor/sensory deficits  LABORATORY DATA:  I have reviewed the data as listed CBC Latest Ref Rng & Units 12/30/2017 05/09/2017 05/08/2017  WBC 4.0 - 10.3 K/uL 3.8(L) 4.8 3.8(L)  Hemoglobin 13.0 - 17.1 g/dL 12.6(L) 11.0(L) 12.6(L)  Hematocrit 38.4 - 49.9 % 38.4 34.3(L) 39.1  Platelets 140 - 400 K/uL 225 179 212   CMP Latest Ref Rng & Units 12/30/2017 05/09/2017 05/08/2017  Glucose 70 - 99 mg/dL 166(H) 139(H) 128(H)  BUN 8 - 23 mg/dL '12 12 10  '$ Creatinine 0.61 - 1.24 mg/dL 0.92 0.96 0.83  Sodium 135 - 145 mmol/L 139 140 142  Potassium 3.5 - 5.1 mmol/L 4.6 4.0 5.0  Chloride 98 - 111 mmol/L 100 106 108  CO2 22 - 32 mmol/L '28 26 25  '$ Calcium 8.9 - 10.3 mg/dL 10.3 8.5(L) 8.8(L)  Total Protein 6.5 - 8.1 g/dL 7.6 - -  Total Bilirubin 0.3 - 1.2 mg/dL 0.7 - -  Alkaline Phos 38 - 126 U/L 49  - -  AST 15 - 41 U/L 19 - -  ALT 0 - 44 U/L 24 - -    PATHOLOGY  Diagnosis 12/19/17  FINE NEEDLE ASPIRATION,ENDOSCOPIC, PANCREAS BODY (SPECIMEN 1 OF 1 COLLECTED 12/19/17): MALIGNANT CELLS CONSISTENT WITH ADENOCARCINOMA.  PROCEDURE  Upper endoscopic [place] 12/19/17 per Dr. Carol Ada  In the body of the pancreas there was a large 20 x 30 mm, approximately, hypoechoic mass. It had irregular margin, but it was round/oval in appearance. There was pancreatic tail atrophy, but the PD was dilated up to 7.6 mm. The celiac axis was clearly viewed and there was no evidence of invasion. In the head of the pancreas a 1 cm lymph node was identified and the pancreas was lobular in the head and uncinate process. The left adrenal was clearly identified and it was enlarged, but there was no evidence of any masses.  RADIOGRAPHIC STUDIES: I have personally reviewed the radiological images as listed and agreed with the findings in the report. No results found.  OUTSIDE CT CT AP WITH CONTRAST IMPRESSION: (outside records) 12/11/2017 Focal area of abnormal low attenuation in the pancreatic body measuring 3.7 x 2.9 cm with cut off of the duct most concerning for primary pancreatic neoplasm.  2 small low-attenuation  foci identified within the liver which are nonspecific however could represent metastatic disease; one is 1.0 cm complex lesion within the hepatic dome, the other area is a low-attenuation focus adjacent to the falciform ligament likely representing a perfusion anomaly or possible local fatty infiltration  Extensive atherosclerotic changes in the abdominal aorta, iliac vessels, splenic vessels.  Occlusion of the right and possibly left iliac.  Suspected chronic re-constitution of flow identified within the femoral vessels bilaterally.  Prostate brachytherapy consistent with history of malignancy  No evidence of small bowel obstruction.  Findings suggestive of constipation        ASSESSMENT &  PLAN: Michael Maddox is a pleasant 67 year old male with history of prostate cancer presented with abdominal pain, 30 lbs weight loss, and constipation worsening over 3-4 months   1. Adenocarcinoma of body of pancreas -we reviewed his medical record including imaging and pathology in detail with the patient and family.  -he has what appears to be localized adenocarcinoma in the body of the pancreas, with 2 indeterminate liver lesions. The outside CT report does not comment much on vascular involvement of the tumor; Dr. Burr Medico recommends MRI abdomen w/wo contrast to further classify vascular involvement and evaluate the liver lesions -He will need to undergo CT chest to complete staging, to r/o metastasis in the chest.  -We will present his case in GI tumor board after CT and MRI have been done  -If the liver is benign and no extensive vascular involvement, will refer to Dr. Barry Dienes for surgical consult.  -If liver lesions are felt to represent metastatic disease, Dr. Burr Medico will likely recommend liver biopsy. If confirmed metastasis, he would not be a surgical candidate and his cancer would no longer be curable, but treatable. -Dr. Burr Medico discussed treatment options, which will depend on his staging work up; she reviewed the aggressive nature of pancreatic adenocarcinoma and the high risk for recurrence after surgery. He would likely need neoadjuvant or adjuvant chemotherapy  -He will attend chemo class.  -His chemo regimen, if offered before surgery, could last anywhere from 2-6 months; Dr. Burr Medico explained neoadjuvant chemo is usually more intensive, to shrink the tumor and help surgery to be more successful; he has multiple co-morbidities, chemotherapy would be challenging for him -Chemotherapy consent: Side effects including but not limited to fatigue, nausea, vomiting, diarrhea, hair loss, neuropathy, fluid retention, potential cardiac toxicity/vasospasm, renal and kidney dysfunction, neutropenic fever, need  for blood transfusion, bleeding, were discussed with patient in great detail. He is interested.  -We will obtain staging work up and CA 19-9 then discuss in tumor board; final treatment plan pending work up and discussion; we will follow up with the patient -will do lab today; CBC shows mild anemia, Hgb 12.6; WBC 3.8, and BG 166 on CMP; otherwise unremarkable -Due to his weight loss at presentation and new DM diagnosis, I referred him to dietician  -given his personal history of prostate and pancreatic cancer, he is eligible for genetics referral. I explained that if he is found to have BRCA mutation, he would be a candidate for PARP inhibitor, he agrees to referral   2. Adenocarcinoma of the prostate stage T1c  -on 11/20/2016 with gleason score 4+3 and PSA 5.73 s/p radioactive seed implant per Dr. Tammi Klippel on 04/11/18, followed by Dr. Karsten Ro.   3. HTN, HL, DM -ON plavis, aspirin, metformin -followed by PCP Dr. Jeanie Cooks  4. TIA in 2010 s/p endarterectomy, CAD s/p CABG in 2015, PVD  -requiring revascularization and stenting  in the LEs and on anticoagulation with plavix and aspirin; followed by cardiologist Dr. [name].   PLAN: -Labs today  -CT chest, MRI abdomen w/wo contrast in next week  -Chemo class, genetics, dietician in next week or so -F/u pending work up and GI tumor board discussion    Orders Placed This Encounter  Procedures  . MR Abdomen W Wo Contrast    Standing Status:   Future    Standing Expiration Date:   03/02/2019    Order Specific Question:   ** REASON FOR EXAM (FREE TEXT)    Answer:   newly diagnosed pancreatic cancer, evaluate vasculature involvement and liver lesions    Order Specific Question:   If indicated for the ordered procedure, I authorize the administration of contrast media per Radiology protocol    Answer:   Yes    Order Specific Question:   What is the patient's sedation requirement?    Answer:   No Sedation    Order Specific Question:   Does the patient  have a pacemaker or implanted devices?    Answer:   No    Order Specific Question:   Radiology Contrast Protocol - do NOT remove file path    Answer:   \\[email or web address]    Order Specific Question:   Preferred imaging location?    Answer:   [facility] (table limit-350 lbs)  . CT Chest W Contrast    Standing Status:   Future    Standing Expiration Date:   12/30/2018    Order Specific Question:   If indicated for the ordered procedure, I authorize the administration of contrast media per Radiology protocol    Answer:   Yes    Order Specific Question:   Preferred imaging location?    Answer:   [facility]    Order Specific Question:   Radiology Contrast Protocol - do NOT remove file path    Answer:   \\[email or web address]  . CBC with Differential ([place] Only)    Standing Status:   Standing    Number of Occurrences:   50    Standing Expiration Date:   12/31/2023  . CMP ([place] only)    Standing Status:   Standing    Number of Occurrences:   50    Standing Expiration Date:   12/31/2023  . CA 19.9    Standing Status:   Standing    Number of Occurrences:   50    Standing Expiration Date:   12/31/2023  . Ambulatory referral to Nutrition and Diabetic E    Referral Priority:   Routine    Referral Type:   Consultation    Referral Reason:   Specialty Services Required    Number of Visits Requested:   1  . Ambulatory referral to Genetics    Referral Priority:   Routine    Referral Type:   Consultation    Referral Reason:   Specialty Services Required    Number of Visits Requested:   1    All questions were answered. The patient knows to call the clinic with any problems, questions or concerns. I spent 45 minutes counseling the patient face to face. The total time spent in the appointment was 60 minutes and more than 50% was on counseling.     Alla Feeling, NP 12/30/2017   Addendum  I have seen the  patient, examined him. I agree with the assessment and and plan and have edited the notes.  Michael Maddox is a 67 year old African-American male, with past medical history of hypertension, diabetes, coronary artery disease, PVD, history of prostate cancer in 2018, status post active seeds implant, presented with abdominal discomfort, anorexia and weight loss.  I reviewed his outside CT abdomen and pelvis reports, unfortunately the image was not available for review at the time of his office visit.  He will drop off the disc in the next few days.  I recommend an abdominal MRI with and without contrast to further evaluate his liver lesions, and a CT chest without contrast to complete staging.  We discussed the treatment options for pancreatic cancer.  If no evidence of distant metastasis, will review in our GI tumor board next week to see if he is a candidate for upfront surgery, or neoadjuvant chemotherapy followed by surgery.  We discussed the aggressive nature of pancreatic cancer, and high risk of recurrence with surgery alone, and the benefit and the potential side effects of neoadjuvant or adjuvant chemotherapy.  Questions were answered, I will plan to see him back in 2 weeks after the above work-ups, to finalize his treatment plan. Meantime, will set up nutritional consult, and repeat labs including tumor marker CA 19.9.  Truitt Merle  12/30/2017

## 2017-12-30 NOTE — Telephone Encounter (Signed)
Gave pt avs and calendar  °

## 2017-12-30 NOTE — Progress Notes (Signed)
  Oncology Nurse Navigator Documentation  Navigator Location: [facility] (12/30/17 1434) Referral date to RadOnc/MedOnc: 12/26/17 (12/30/17 1434) )Navigator Encounter Type: Initial MedOnc (12/30/17 1434)   Met with Michael Maddox, wife and adult daughter. Explained role of nurse navigator. Educational information provided on pancreatic cancer, strategies for constipation. [facility] resources provided to patient, including SW service information.  Contact names and phone numbers were provided for entire [facility] team.  Teach back method was used.  No barriers to care identified at present time.  Will continue to follow as needed Abnormal Finding Date: 12/19/17 (12/30/17 1434) Confirmed Diagnosis Date: 12/19/17 (12/30/17 1434)               Patient Visit Type: MedOnc;Initial (12/30/17 1434) Treatment Phase: Pre-Tx/Tx Discussion (12/30/17 1434) Barriers/Navigation Needs: Education (12/30/17 1434) Education: Coping with Diagnosis/ Prognosis;Pain/ Symptom Management (12/30/17 1434) Interventions: Education;Psycho-social support (12/30/17 1434)     Education Method: Teach-back;Verbal;Written (12/30/17 1434)  Support Groups/Services: Friends and Family (12/30/17 1434)   Acuity: Level 3 (12/30/17 1434)

## 2017-12-30 NOTE — Progress Notes (Addendum)
Nutrition Assessment   Reason for Assessment:   Referral for weight loss, poor po intake and new diagnosis of pancreatic cancer   ASSESSMENT:   67 year old male with new diagnosis of adenocarcinoma of the pancreas.  Past medical history of prostate cancer in 11/2016, recent diagnosis of DM, CAD, HTN, HLD, stroke, COPD, GERD, COPD Patient reports not sure of treatment plan yet.  Met with NP Lacie today  Met with patient, wife and daughter Donella Stade today in clinic.  Patient reports for the last few months has had trouble handling solid foods due to abdominal pain and emesis 2-3 times per week after eating (can vary on how soon emesis comes after eating).  Patient reports that mostly he has been drinking 3 glucerna per day at meal times and few sips of soup at times (540-600 calories per day).  Is having trouble with constipation and hemorrhoids.  Has not noticed more abdominal pain or nausea/vomiting after injection certain foods (ie fatty foods).    Nutrition Focused Physical Exam: deferred   Medications: dexilant, metformin Patient reports sennekot, miralax and colace recommended today to help with constipation   Labs: glucose 151, a1c 6?? Per patient report   Anthropometrics:   Height: 71 inches Weight: 129 lb 3.2 oz  UBW: Noted wt 151 lb on 08/14/17 BMI: 18  16% weight loss in the last 4.5 months, significant   Estimated Energy Needs  Kcals: 1610-9604 calories/d Protein: 89-103 g/d Fluid: 2 L/d   NUTRITION DIAGNOSIS: Malnutrition related to abdominal pain, nausea, vomiting, new diagnosis pancreatic cancer as evidenced by 16% weight loss in the last 4.5 months and eating 600 calories or less for the last few months   MALNUTRITION DIAGNOSIS: Patient meets criteria for severe malnutrition in context of chronic illness as evidenced by 16% weight loss in 4.5 months and eating < 75% energy needs for > 1 month   INTERVENTION:  Recommend patient switching to higher calorie  shake and/or supplementing with higher calorie shake (ie boost plus, ensure plus, ensure enlive) to provide more calorie than glucerna.(540 calories vs 1050 calories with higher calorie shakes). Samples given today for patient to try. Encouraged patient to liberalize diet due to severe weight loss and poor po intake.   Recommend checking blood glucose and having MD adjust medications for better blood glucose control vs restricting food intake.   Encouraged small frequent meals.  Answered questions regarding Does sugar feed cancer? Provided recipes for smoothies/shakes for patient to try at this time. Discussed strategies to help with constipation although correcting with food will be less likely due to poor po intake.  Will need to continue bowel regimen as well.   Noted per provider note when patient has bowel movement light colored bm, that floats and has foul odor.  ?May consider a trial of pancreatic enzymes.   Contact information given   MONITORING, EVALUATION, GOAL: Patient will work to increase calories and protein to prevent further weight loss   Next Visit: to be determined  Ayasha Ellingsen B. Zenia Resides, [place], [place] Registered Dietitian [phone] (pager)

## 2017-12-31 ENCOUNTER — Telehealth: Payer: Self-pay | Admitting: Hematology

## 2017-12-31 LAB — CANCER ANTIGEN 19-9: CA 19-9: 1263 U/mL — ABNORMAL HIGH (ref 0–35)

## 2017-12-31 NOTE — Telephone Encounter (Signed)
Called patient to schedule Genetics appt and he stated he had already seen Nut on 9/10/ per9/10 sch msg

## 2018-01-02 ENCOUNTER — Other Ambulatory Visit: Payer: Medicare Other

## 2018-01-02 ENCOUNTER — Telehealth: Payer: Self-pay

## 2018-01-02 DIAGNOSIS — E1165 Type 2 diabetes mellitus with hyperglycemia: Secondary | ICD-10-CM | POA: Diagnosis not present

## 2018-01-02 DIAGNOSIS — C251 Malignant neoplasm of body of pancreas: Secondary | ICD-10-CM | POA: Diagnosis not present

## 2018-01-02 DIAGNOSIS — I2510 Atherosclerotic heart disease of native coronary artery without angina pectoris: Secondary | ICD-10-CM | POA: Diagnosis not present

## 2018-01-02 DIAGNOSIS — I10 Essential (primary) hypertension: Secondary | ICD-10-CM | POA: Diagnosis not present

## 2018-01-02 NOTE — Telephone Encounter (Signed)
Per Dr. Burr Medico we are cancelling the chemo education class today until treatment is further discussed.  Patient verbalized an understanding, scheduling message has been sent, notified Abigail Butts in Education.

## 2018-01-05 ENCOUNTER — Ambulatory Visit (HOSPITAL_COMMUNITY)
Admission: RE | Admit: 2018-01-05 | Discharge: 2018-01-05 | Disposition: A | Discharge status: Home / Self Care | Payer: Medicare Other | Source: Ambulatory Visit | Attending: Nurse Practitioner | Admitting: Nurse Practitioner

## 2018-01-05 ENCOUNTER — Other Ambulatory Visit: Payer: Self-pay | Admitting: Nurse Practitioner

## 2018-01-05 ENCOUNTER — Inpatient Hospital Stay: Payer: Medicare Other | Admitting: Nutrition

## 2018-01-05 DIAGNOSIS — D490 Neoplasm of unspecified behavior of digestive system: Secondary | ICD-10-CM

## 2018-01-05 DIAGNOSIS — R932 Abnormal findings on diagnostic imaging of liver and biliary tract: Secondary | ICD-10-CM | POA: Diagnosis not present

## 2018-01-05 DIAGNOSIS — C251 Malignant neoplasm of body of pancreas: Secondary | ICD-10-CM

## 2018-01-05 DIAGNOSIS — I700 Atherosclerosis of aorta: Secondary | ICD-10-CM | POA: Insufficient documentation

## 2018-01-05 DIAGNOSIS — R918 Other nonspecific abnormal finding of lung field: Secondary | ICD-10-CM | POA: Diagnosis not present

## 2018-01-05 DIAGNOSIS — C259 Malignant neoplasm of pancreas, unspecified: Secondary | ICD-10-CM | POA: Diagnosis not present

## 2018-01-05 MED ORDER — GADOBUTROL 1 MMOL/ML IV SOLN
6.0000 mL | Freq: Once | INTRAVENOUS | Status: AC | PRN
  Administered 2018-01-05: 6 mL via INTRAVENOUS

## 2018-01-05 MED ORDER — IOHEXOL 300 MG/ML  SOLN
75.0000 mL | Freq: Once | INTRAMUSCULAR | Status: AC | PRN
  Administered 2018-01-05: 75 mL via INTRAVENOUS

## 2018-01-05 NOTE — Progress Notes (Signed)
Brief nutrition follow-up completed with patient with new diagnosis of pancreas cancer. Patient reports he just saw RD last week. Patient reports he is hungry today and he has not been allowed to eat because he is scheduled for CT.   Patient does not monitor blood sugars.  Noted glucose 166 on September 10. Reports he is able to drink Ensure Plus or boost plus. Patient continues to report his stool floating on top of the water.   No other nutrition impact symptoms.  Nutrition diagnosis: Severe malnutrition continues.  Intervention: Recommended patient continue oral nutrition supplements between meals. Provided samples and coupons.  Monitoring, evaluation, goals: Patient will work to increase oral intake to minimize further weight loss.  Next visit: To be scheduled as needed.  **Disclaimer: This note was dictated with voice recognition software. Similar sounding words can inadvertently be transcribed and this note may contain transcription errors which may not have been corrected upon publication of note.**

## 2018-01-06 ENCOUNTER — Ambulatory Visit (HOSPITAL_COMMUNITY): Payer: Medicare Other

## 2018-01-08 ENCOUNTER — Ambulatory Visit (HOSPITAL_COMMUNITY): Payer: Medicare Other

## 2018-01-13 ENCOUNTER — Encounter: Payer: Self-pay | Admitting: Hematology

## 2018-01-13 ENCOUNTER — Inpatient Hospital Stay: Payer: Medicare Other | Admitting: Hematology

## 2018-01-13 ENCOUNTER — Telehealth: Payer: Self-pay | Admitting: Hematology

## 2018-01-13 ENCOUNTER — Inpatient Hospital Stay: Payer: Medicare Other

## 2018-01-13 VITALS — BP 127/72 | HR 93 | Temp 98.9°F | Resp 17 | Ht 71.0 in | Wt 124.9 lb

## 2018-01-13 DIAGNOSIS — R110 Nausea: Secondary | ICD-10-CM

## 2018-01-13 DIAGNOSIS — Z8546 Personal history of malignant neoplasm of prostate: Secondary | ICD-10-CM

## 2018-01-13 DIAGNOSIS — Z87891 Personal history of nicotine dependence: Secondary | ICD-10-CM

## 2018-01-13 DIAGNOSIS — I2510 Atherosclerotic heart disease of native coronary artery without angina pectoris: Secondary | ICD-10-CM

## 2018-01-13 DIAGNOSIS — C251 Malignant neoplasm of body of pancreas: Secondary | ICD-10-CM

## 2018-01-13 DIAGNOSIS — Z951 Presence of aortocoronary bypass graft: Secondary | ICD-10-CM

## 2018-01-13 DIAGNOSIS — I10 Essential (primary) hypertension: Secondary | ICD-10-CM | POA: Diagnosis not present

## 2018-01-13 DIAGNOSIS — Z7189 Other specified counseling: Secondary | ICD-10-CM

## 2018-01-13 DIAGNOSIS — R5383 Other fatigue: Secondary | ICD-10-CM

## 2018-01-13 DIAGNOSIS — E119 Type 2 diabetes mellitus without complications: Secondary | ICD-10-CM

## 2018-01-13 DIAGNOSIS — F1021 Alcohol dependence, in remission: Secondary | ICD-10-CM

## 2018-01-13 LAB — CMP (CANCER CENTER ONLY)
ALBUMIN: 4.6 g/dL (ref 3.5–5.0)
ALK PHOS: 52 U/L (ref 38–126)
ALT: 14 U/L (ref 0–44)
ANION GAP: 7 (ref 5–15)
AST: 13 U/L — ABNORMAL LOW (ref 15–41)
BILIRUBIN TOTAL: 0.5 mg/dL (ref 0.3–1.2)
BUN: 20 mg/dL (ref 8–23)
CALCIUM: 10.2 mg/dL (ref 8.9–10.3)
CO2: 33 mmol/L — ABNORMAL HIGH (ref 22–32)
CREATININE: 0.95 mg/dL (ref 0.61–1.24)
Chloride: 99 mmol/L (ref 98–111)
GFR, Estimated: >60 mL/min (ref >60)
GLUCOSE: 163 mg/dL — AB (ref 70–99)
Potassium: 4.6 mmol/L (ref 3.5–5.1)
Sodium: 139 mmol/L (ref 135–145)
TOTAL PROTEIN: 7.6 g/dL (ref 6.5–8.1)

## 2018-01-13 LAB — CBC WITH DIFFERENTIAL (CANCER CENTER ONLY)
BASOS PCT: 1 %
Basophils Absolute: 0 10*3/uL (ref 0.0–0.1)
EOS ABS: 0 10*3/uL (ref 0.0–0.5)
Eosinophils Relative: 1 %
HEMATOCRIT: 40.8 % (ref 38.4–49.9)
HEMOGLOBIN: 13 g/dL (ref 13.0–17.1)
Lymphocytes Relative: 36 %
Lymphs Abs: 1.2 10*3/uL (ref 0.9–3.3)
MCH: 28.9 pg (ref 27.2–33.4)
MCHC: 32 g/dL (ref 32.0–36.0)
MCV: 90.4 fL (ref 79.3–98.0)
MONO ABS: 0.4 10*3/uL (ref 0.1–0.9)
Monocytes Relative: 12 %
NEUTROS ABS: 1.6 10*3/uL (ref 1.5–6.5)
Neutrophils Relative %: 50 %
Platelet Count: 198 10*3/uL (ref 140–400)
RBC: 4.51 MIL/uL (ref 4.20–5.82)
RDW: 12.8 % (ref 11.0–14.6)
WBC Count: 3.2 10*3/uL — ABNORMAL LOW (ref 4.0–10.3)

## 2018-01-13 MED ORDER — HYDROCODONE-ACETAMINOPHEN 5-325 MG PO TABS: 1.0000 | ORAL_TABLET | Freq: Four times a day (QID) | ORAL | 0 refills | Status: DC | PRN

## 2018-01-13 NOTE — Telephone Encounter (Signed)
Scheduled appt per 9/23 sch message - pt is aware of appt date and time.

## 2018-01-13 NOTE — Progress Notes (Signed)
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       Malignant neoplasm of body of pancreas ([place])   12/11/2017 Imaging    CT AP WITH CONTRAST IMPRESSION: Focal area of abnormal low attenuation in the pancreatic body measuring 3.7 x 2.9 cm with cut off of the duct most concerning for primary pancreatic neoplasm.  2 small low-attenuation foci identified within the liver which are nonspecific however could represent metastatic disease; one is 1.0 cm complex lesion within the hepatic dome, the other area is a low-attenuation focus adjacent to the falciform ligament likely representing a perfusion anomaly or possible local fatty infiltration  Extensive atherosclerotic changes in the abdominal aorta, iliac vessels, splenic vessels.  Occlusion of the right and possibly left iliac.  Suspected chronic re-constitution of flow identified within the femoral vessels bilaterally.  Prostate brachytherapy consistent with history of malignancy  No evidence of small bowel obstruction.  Findings suggestive of constipation     12/19/2017 Procedure    EUS biopsy: In the body of the pancreas there was a large 20 x 30 mm, approximately, hypoechoic mass. It had irregular margin, but it was round/oval in appearance. There was pancreatic tail atrophy, but the PD was dilated up to 7.6 mm. The celiac axis was clearly viewed and there was no evidence of invasion. In the head of the pancreas a 1 cm lymph node was  identified and the pancreas was lobular in the head and uncinate process. The left adrenal was clearly identified and it was enlarged, but there was no evidence of any masses.    12/19/2017 Initial Biopsy    Diagnosis FINE NEEDLE ASPIRATION,ENDOSCOPIC, PANCREAS BODY (SPECIMEN 1 OF 1 COLLECTED 12/19/17): MALIGNANT CELLS CONSISTENT WITH ADENOCARCINOMA.    12/19/2017 Initial Diagnosis    Malignant neoplasm of body of pancreas ([place])    01/05/2018 Imaging    01/05/2018 MRI Abdomen IMPRESSION: 1. Poorly marginated hypoenhancing 4.2 x 2.5 x 5.2 cm pancreatic body mass with pancreatic tail atrophy and duct dilation, compatible with known pancreatic adenocarcinoma. Pancreatic tumor appears locally advanced with significant vascular involvement as detailed. No biliary ductal dilatation. 2. No abdominal adenopathy. 3. Two similar subcentimeter enhancing left liver lobe masses as detailed, which are too small to accurately characterize on this mildly motion degraded scan. Differential includes small metastases or hemangiomas. Suggest follow-up MRI abdomen without and with IV contrast in 3 months. These lesions are probably too small to characterize by PET-CT. 4.  Aortic Atherosclerosis (ICD10-I70.0).    01/06/2018 Imaging    01/06/2018 CT Chest IMPRESSION: 1. Two non-specific small left lower lobe pulmonary nodules. Otherwise, no evidence of metastatic disease within the chest. 2.  Aortic Atherosclerosis (ICD10-I70.0).  Emphysema (ICD10-J43.9).    01/13/2018 Tumor Marker    Tumor Marker Results for Michael Maddox, Michael Maddox (MRN [id number]) as of 01/13/2018 09:17  Ref. Range 12/30/2017 14:59  CA 19-9 Latest Ref Range: 0 - 35 U/mL 1,263 (H)      HISTORY OF PRESENTING ILLNESS:  (According to NP Lacie on 12/30/2017) Michael Maddox 67 y.o. male  is here at the request of Dr. Carol Ada. He presented to PCP Dr. Jeanie Cooks c/o 1.5 - 2 month history of abdominal pain, constipation, and 30 lbs weight loss that  gradually worsened over time. There was associated epigastric pain. He underwent CT AP with contrast on 12/11/17 that showed focal area of enlargement involving the pancreatic body measuring approximately 3.7 x 2.9 cm concerning for primary pancreatic neoplasm. Two small lesions were noted in the liver which are indeterminate.  He underwent EUS biopsy on 12/19/2017 per Dr. Benson Norway which showed a large 20 x 30 mm hypoechoic mass in the body of the pancreas with irregular margins.  There was pancreatic tail atrophy and pancreatic duct dilatation up to 7.6 mm.  In the head of the pancreas a 1 cm lymph node was identified.  Biopsy of the pancreas body mass was positive for malignant cells consistent with adenocarcinoma.   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 Tammi Klippel on 04/11/18, followed by Dr. Karsten Ro. HTN, HL, TIA in 2010 s/p endarterectomy, CAD s/p CABG in 2015, PVD requiring revascularization and stenting in the LEs and on anticoagulation with plavix and aspirin; followed by cardiologist Dr. [name]. He is recently diagnosed with DM on metformin, most recent BG 151, and A1c "6.?"   Socially, he He is a former smoker 1 PPD x35 years, quit in 2015; denies drug use or current alcohol use. He quit drinking 2 months ago and before that he drank 1-2 drinks per week. 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 Drives himself and is mostly independent with ADLs. He previously was not very physically active due to claudication.  He has family history of breast cancer in his mother diagnosed at age 76. Denies other breast, gyn, or colon cancers. He has 3 children who are healthy.   Today, he reports mild fatigue and decreased appetite that began after his prostate cancer treatment that has gradually worsened over 3-4 months. He has lost 30 lbs, drinks glucerna occasionally. He has occasional post-prandial  emesis and constant band-like pain across his abdomen, he rates 5/10. He has taken percocet which he does not find very helpful. He has constipation, but when he does have BM it is light colored and floats, with found odor. Denies dark urine, juandice, fever, chills, diarrhea, cough, chest pain, dyspnea, or leg edema.   CURRENT THERAPY: pending first line chemo FOLFIRINOX every 2 weeks    INTERVAL HISTORY: Michael Maddox is a 67 y.o. male who is here for follow-up. He was seen by NP Lacie on 12/30/2017. He also saw a dietician who recommended a higher calorie intake and BG monitoring on 12/30/2017.  Today, he is here alone at the clinic. He complains of abdominal pain, for which he saw his PCP who prescribed Tramadol that didn't help. The pain starts in mid abdomen and radiates to his RUQ and back. It is 7/10 in severity and worsens with food. He states that it is intermittent and sometimes wakes him up from sleep.  He is also constipated. His appetite is bad and he is losing weight. He is taking 3 appetite boosters a day and eats 3 meals a day. She sometimes feels nauseated, but it is mild. He also reports dizziness, especially when standing up.  He is hypertensive and takes BP medications.   He works in a Air cabin crew.  REVIEW OF SYSTEMS:  Constitutional: Denies fevers, chills (+) decreased appetite and weight loss Eyes: Denies blurriness of vision Ears, nose, mouth, throat, and face: Denies mucositis or sore throat Respiratory: Denies cough, dyspnea or wheezes Cardiovascular: Denies palpitation, chest discomfort or lower extremity swelling Gastrointestinal:  Denies nausea, heartburn (+) abdominal pain, midabdominal and radiates to his right side and back, 7/10 in severity, intermittent (+) constipation Skin: Denies abnormal skin rashes Lymphatics: Denies new lymphadenopathy or easy bruising Neurological:Denies numbness, tingling or new weaknesses Behavioral/Psych: Mood is stable,  no new changes  All other systems were reviewed with the patient and are negative.  MEDICAL HISTORY:  Past Medical History:  Diagnosis Date  . Arthritis   . Carotid artery disease ([place]) followed by dr berry--- per last duplex [id number]  LICA 26-71%, bilateral ECA >50%,  patent RICA post intervention   hx acute high grade stenosis RICA >80% 02-22-2009 in setting of  stroke  s/p  right carotid endarterectomy 02-28-2009/    . Claudication of both lower extremities ([place])    due to PAD ----  right > left  . COPD (chronic obstructive pulmonary disease) ([place])   . Coronary artery disease CARDIOLOGIST-  DR BERRY   hx positive myoview 07-29-2013;  08-16-2013 per cardiac cath, severe 3V CAD w/ 60% subclavian stenosis;  08-23-2013   s/p  CABG x3  (LIMA to LAD, SVG to OM1, SVG to PDA)  . CVA (cerebral vascular accident) ([place]) 02/22/2009   acute infarct right hemisphere w/ severe RICA stentosis----  per pt no residuals  . GERD (gastroesophageal reflux disease)   . HTN (hypertension)   . Hyperlipidemia   . Hyperplasia of prostate with lower urinary tract symptoms (LUTS)   . Peripheral arterial disease ([place]) followed by dr berry--- last dobbler ABIs bilateral SFA occlusions (pt is scheduled for intervention 05-08-2017   hx PTA and stenting to bilateral SFA 2002 and PTA w/ stenting left common iliac artery and for in-stent restenosis 2003 Left SFA and 2005  stenting right SFA for in-stent restenosis  . Prostate cancer [facility]) urologist-  dr ottelin/  oncologist-  dr Tammi Klippel   dx 11-20-2016-- Stage T1c, Gleason 4+3, PSA 5.73, vol 35.95cc--- scheduled for radioactive seed implants 04-11-2017  . 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 S/P peripheral artery angioplasty with stent placement    bilateral SFA 2002;  in-stenosis bilateral SFA  (right 2003, left 2005) and left CIA 2003  . Shortness of breath   . Type II diabetes mellitus ([place])   . Weak urinary stream   . Wears glasses       SURGICAL HISTORY: Past Surgical History:  Procedure Laterality Date  . ABDOMINAL AORTOGRAM N/A 05/08/2017   Procedure: ABDOMINAL AORTOGRAM;  Surgeon: Lorretta Harp, MD;  Location: [place] [facility];  Service: Cardiovascular;  Laterality: N/A;  . CARDIAC CATHETERIZATION  12-01-2000    dr berry    non-critial CAD & nonischemic cardiolite-- mLAD 50%, mLCx 70%, prox. to mid RCA 75% focal midsegment 80-85%  . CARDIOVASCULAR STRESS TEST  07-29-2013    dr berry   High risk nuclear study w/ inferoseptal ischemia/  normal LV function and wall motion,  nuclear study ef 52%  . CAROTID ENDARTERECTOMY Right 02-28-2009    dr c. Scot Dock  [facility]   patch angioplasty  . COLONOSCOPY    . CORONARY ARTERY BYPASS GRAFT N/A 08/23/2013   Procedure: CORONARY ARTERY BYPASS GRAFTING (CABG);  Surgeon: Melrose Nakayama, MD;  Location: [facility] OR;  Service: Open Heart Surgery;  Laterality: N/A;  Coronary Artery Bypass graft times three using left internal mammary artery and left leg saphenous vein   . CYSTOSCOPY N/A 04/11/2017   Procedure: CYSTOSCOPY FLEXIBLE;  Surgeon: Kathie Rhodes, MD;  Location: [facility];  Service: Urology;  Laterality: N/A;  no seeds found in bladder  . ENDOVASCULAR STENT INSERTION Bilateral right 12-25-2000 ;  left 03-02-2001   dr berry   PTA and stenting to bilateral SFA  . ENDOVASCULAR STENT INSERTION Bilateral right 08-30-2003 ;  left 11-10-2001    dr berry   PTA and stenting for in-stent restenosis right SFA 08-30-2003:  PTA and stenting distal left common iliac artery and in-stent restenosis left SFA 11-10-2001  . FINE NEEDLE ASPIRATION N/A 12/19/2017   Procedure: FINE NEEDLE ASPIRATION (FNA) LINEAR;  Surgeon: Carol Ada, MD;  Location: [facility];  Service: Endoscopy;  Laterality: N/A;  . INTRAOPERATIVE TRANSESOPHAGEAL ECHOCARDIOGRAM N/A 08/23/2013   Procedure: INTRAOPERATIVE TRANSESOPHAGEAL ECHOCARDIOGRAM;  Surgeon: Melrose Nakayama, MD;  Location: [place];  Service:  Open Heart Surgery;  Laterality: N/A;  . LEFT HEART CATHETERIZATION WITH CORONARY ANGIOGRAM N/A 08/16/2013   Procedure: LEFT HEART CATHETERIZATION WITH CORONARY ANGIOGRAM;  Surgeon: Lorretta Harp, MD;  Location: [facility] CATH LAB;  Service: Cardiovascular;  Laterality: N/A;   severe 3V CAD and 60% subclavian sternosis  . LOWER EXTREMITY ANGIOGRAM N/A 08/16/2013   Procedure: LOWER EXTREMITY ANGIOGRAM;  Surgeon: Lorretta Harp, MD;  Location: [facility] C[facility] LAB;  Service: Cardiovascular;  Laterality: N/A;  . LOWER EXTREMITY ANGIOGRAPHY Bilateral 05/08/2017   Procedure: Lower Extremity Angiography;  Surgeon: Lorretta Harp, MD;  Location: [place] [facility];  Service: Cardiovascular;  Laterality: Bilateral;  . PERIPHERAL VASCULAR BALLOON ANGIOPLASTY Left 05/08/2017   Procedure: PERIPHERAL VASCULAR BALLOON ANGIOPLASTY;  Surgeon: Lorretta Harp, MD;  Location: [place] [facility];  Service: Cardiovascular;  Laterality: Left;  COMMON ILIAC  . RADIOACTIVE SEED IMPLANT N/A 04/11/2017   Procedure: RADIOACTIVE SEED IMPLANT/BRACHYTHERAPY IMPLANT;  Surgeon: Kathie Rhodes, MD;  Location: [facility];  Service: Urology;  Laterality: N/A;    67   seeds implanted  . SPACE OAR INSTILLATION N/A 04/11/2017   Procedure: SPACE OAR INSTILLATION;  Surgeon: Kathie Rhodes, MD;  Location: [facility];  Service: Urology;  Laterality: N/A;  . TRANSTHORACIC ECHOCARDIOGRAM  07/15/2013   ef 23-30%, grade 1 diastolic dysfunction/  trivial MR, mild to moderate PR, mild TR   . UPPER ESOPHAGEAL ENDOSCOPIC ULTRASOUND (EUS) N/A 12/19/2017   Procedure: UPPER ESOPHAGEAL ENDOSCOPIC ULTRASOUND (EUS);  Surgeon: Carol Ada, MD;  Location: [facility];  Service: Endoscopy;  Laterality: N/A;    I have reviewed the social history and family history with the patient and they are unchanged from previous note.  ALLERGIES:  has No Known Allergies.  MEDICATIONS:  Current Outpatient Medications  Medication Sig  Dispense Refill  . aspirin 81 MG chewable tablet Chew 1 tablet (81 mg total) by mouth daily. (Patient taking differently: Chew 81 mg by mouth at bedtime. )    . cilostazol (PLETAL) 50 MG tablet Take 1 tablet (50 mg total) by mouth 2 (two) times daily. 60 tablet 6  . clopidogrel (PLAVIX) 75 MG tablet Take 75 mg by mouth at bedtime.     Marland Kitchen dexlansoprazole (DEXILANT) 60 MG capsule Take 60 mg by mouth daily as needed (for reflux.).     Marland Kitchen hydrocortisone 2.5 % ointment Apply 1 application topically 2 (two) times  daily as needed (for skin irritation).   0  . metFORMIN (GLUCOPHAGE) 500 MG tablet Take 500 mg by mouth at bedtime.   2  . metoprolol succinate (TOPROL-XL) 25 MG 24 hr tablet Take 25 mg by mouth at bedtime.     . Pseudoeph-Doxylamine-DM-APAP (NYQUIL PO) Take 2 capsules by mouth at bedtime as needed (FOR COUGH AND COLD).    Marland Kitchen simvastatin (ZOCOR) 20 MG tablet Take 20 mg by mouth at bedtime.     . tamsulosin (FLOMAX) 0.4 MG CAPS capsule Take 0.4 mg by mouth every evening.  11  . traMADol (ULTRAM) 50 MG tablet Take 50 mg by mouth every 6 (six) hours as needed.    Marland Kitchen HYDROcodone-acetaminophen (NORCO) 5-325 MG tablet Take 1 tablet by mouth every 6 (six) hours as needed for moderate pain. 30 tablet 0   No current facility-administered medications for this visit.     PHYSICAL EXAMINATION: ECOG PERFORMANCE STATUS: 1 - Symptomatic but completely ambulatory  Vitals:   01/13/18 1454  BP: 127/72  Pulse: 93  Resp: 17  Temp: 98.9 F (37.2 C)  SpO2: 100%   Filed Weights   01/13/18 1454  Weight: 124 lb 14.4 oz (56.7 kg)    GENERAL:alert, no distress and comfortable SKIN: skin color, texture, turgor are normal, no rashes or significant lesions EYES: normal, Conjunctiva are pink and non-injected, sclera clear OROPHARYNX:no exudate, no erythema and lips, buccal mucosa, and tongue normal  NECK: supple, thyroid normal size, non-tender, without nodularity LYMPH:  no palpable lymphadenopathy in the  cervical, axillary or inguinal LUNGS: clear to auscultation and percussion with normal breathing effort HEART: regular rate & rhythm and no murmurs and no lower extremity edema ABDOMEN:abdomen soft, (+) tenderness at epigastric area, normal bowel sounds Musculoskeletal:no cyanosis of digits and no clubbing  NEURO: alert & oriented x 3 with fluent speech, no focal motor/sensory deficits  LABORATORY DATA:  I have reviewed the data as listed CBC Latest Ref Rng & Units 01/13/2018 12/30/2017 05/09/2017  WBC 4.0 - 10.3 K/uL 3.2(L) 3.8(L) 4.8  Hemoglobin 13.0 - 17.1 g/dL 13.0 12.6(L) 11.0(L)  Hematocrit 38.4 - 49.9 % 40.8 38.4 34.3(L)  Platelets 140 - 400 K/uL 198 225 179     CMP Latest Ref Rng & Units 01/13/2018 12/30/2017 05/09/2017  Glucose 70 - 99 mg/dL 163(H) 166(H) 139(H)  BUN 8 - 23 mg/dL 20 12 12   Creatinine 0.61 - 1.24 mg/dL 0.95 0.92 0.96  Sodium 135 - 145 mmol/L 139 139 140  Potassium 3.5 - 5.1 mmol/L 4.6 4.6 4.0  Chloride 98 - 111 mmol/L 99 100 106  CO2 22 - 32 mmol/L 33(H) 28 26  Calcium 8.9 - 10.3 mg/dL 10.2 10.3 8.5(L)  Total Protein 6.5 - 8.1 g/dL 7.6 7.6 -  Total Bilirubin 0.3 - 1.2 mg/dL 0.5 0.7 -  Alkaline Phos 38 - 126 U/L 52 49 -  AST 15 - 41 U/L 13(L) 19 -  ALT 0 - 44 U/L 14 24 -    Tumor Marker CA19.9 (0-35 U/ML) 12/30/2017: 1263    PATHOLOGY  Diagnosis 12/19/17  FINE NEEDLE ASPIRATION,ENDOSCOPIC, PANCREAS BODY (SPECIMEN 1 OF 1 COLLECTED 12/19/17): MALIGNANT CELLS CONSISTENT WITH ADENOCARCINOMA.   PROCEDURE  Upper endoscopic [place] 12/19/17 per Dr. Carol Ada  In the body of the pancreas there was a large 20 x 30 mm, approximately, hypoechoic mass. It had irregular margin, but it was round/oval in appearance. There was pancreatic tail atrophy, but the PD was dilated up to 7.6 mm.  The celiac axis was clearly viewed and there was no evidence of invasion. In the head of the pancreas a 1 cm lymph node was identified and the pancreas was lobular in the head  and uncinate process. The left adrenal was clearly identified and it was enlarged, but there was no evidence of any masses.   RADIOGRAPHIC STUDIES: I have personally reviewed the radiological images as listed and agreed with the findings in the report.  01/06/2018 CT Chest IMPRESSION: 1. Two non-specific small left lower lobe pulmonary nodules. Otherwise, no evidence of metastatic disease within the chest. 2.  Aortic Atherosclerosis (ICD10-I70.0).  Emphysema (ICD10-J43.9).  01/05/2018 MRI Abdomen IMPRESSION: 1. Poorly marginated hypoenhancing 4.2 x 2.5 x 5.2 cm pancreatic body mass with pancreatic tail atrophy and duct dilation, compatible with known pancreatic adenocarcinoma. Pancreatic tumor appears locally advanced with significant vascular involvement as detailed. No biliary ductal dilatation. 2. No abdominal adenopathy. 3. Two similar subcentimeter enhancing left liver lobe masses as detailed, which are too small to accurately characterize on this mildly motion degraded scan. Differential includes small metastases or hemangiomas. Suggest follow-up MRI abdomen without and with IV contrast in 3 months. These lesions are probably too small to characterize by PET-CT. 4.  Aortic Atherosclerosis (ICD10-I70.0).  OUTSIDE CT CT AP WITH CONTRAST IMPRESSION: (outside records) 12/11/2017 Focal area of abnormal low attenuation in the pancreatic body measuring 3.7 x 2.9 cm with cut off of the duct most concerning for primary pancreatic neoplasm.  2 small low-attenuation foci identified within the liver which are nonspecific however could represent metastatic disease; one is 1.0 cm complex lesion within the hepatic dome, the other area is a low-attenuation focus adjacent to the falciform ligament likely representing a perfusion anomaly or possible local fatty infiltration  Extensive atherosclerotic changes in the abdominal aorta, iliac vessels, splenic vessels.  Occlusion of the right and  possibly left iliac.  Suspected chronic re-constitution of flow identified within the femoral vessels bilaterally.  Prostate brachytherapy consistent with history of malignancy  No evidence of small bowel obstruction.  Findings suggestive of constipation     ASSESSMENT & PLAN:  Michael Maddox is a pleasant 67 y.o. male with history of prostate cancer presented with abdominal pain, 30 lbs weight loss, and constipation worsening over 3-4 months   1. Adenocarcinoma of body of pancreas, cT3N0Mx, unresectable, with indeterminate lung nodes and liver lesions  -we reviewed his medical record including imaging and pathology in detail with the patient and family.  -he has what appears to be localized adenocarcinoma in the body of the pancreas, with 2 indeterminate liver lesions and lung nodules.  -I reviewed his recent CT chest, and abdominal MRI image by myself and agree with the radiologist interpretation.  Small lung nodule and liver lesions are indeterminate, probably not metastatic, unfortunately the pancreatic cancer nearly in casts the post splenic veins conference and SMA  (100% circumferential involvement), which makes his tumor unresectable.  His case was discussed in our GI tumor board last week. -Unfortunately his pancreatic cancer is incurable giving the tumor is not resectable .  I recommend him to consider systemic chemotherapy to control his disease.  We discussed the variety regimens, including FOLFIRINOX, gemcitabine and Abraxane, or gemcitabine alone.  The benefit, response rate and central side effects were discussed with him in great details.  Given his good performance status and organ functions, I recommend him to try FOLFIRINOX as first line therapy.   -Chemotherapy consent: Side effects including but not limited to fatigue, nausea,  vomiting, diarrhea, hair loss, cold sensitivity and neuropathy, fluid retention, potential cardiac toxicity/vasospasm, renal and kidney dysfunction,  neutropenic fever, need for blood transfusion, bleeding, were discussed with patient in great detail. He is interested and agrees to proceed   --Goal of therapy is palliative and disease control -We continue chemotherapy as long as it is working and he tolerates.  We will adjust the dose as needed.  We also discussed the option of induction chemotherapy 4 to 6 months, followed by consolidation radiation, then observation. -I informed him that he will need to take time off from his job and offered to write a letter. -I also informed him about port placement and educated him about the procedure. -plan to start chemo in 1-2 weeks  -Chemo class next week.  -Labs today.  2. Adenocarcinoma of the prostate stage T1c  -on 11/20/2016 with gleason score 4+3 and PSA 5.73 s/p radioactive seed implant per Dr. Tammi Klippel on 04/11/18, followed by Dr. Karsten Ro.   3. HTN, HL, DM -ON plavis, aspirin, metformin -followed by PCP Dr. Jeanie Cooks -We discussed the impact of chemotherapy on his blood pressure and glucose level, and adjust his medication as needed.  4. TIA in 2010 s/p endarterectomy, CAD s/p CABG in 2015, PVD  -requiring revascularization and stenting in the LEs and on anticoagulation with plavix and aspirin; followed by cardiologist Dr. [name].   5. Genetics  -we previously refer pt to genetics   6. Goal of care discussion  -We again discussed the incurable nature of his cancer, and the overall poor prognosis, especially if he does not have good response to chemotherapy or progress on chemo -The patient understands the goal of care is palliative. -He is full code now    PLAN: -Chemo class this or next week -Schedule for port placement before first treatment  -Labs, port flush, f/u and start FOLFIRINOX 10/2 and 10/16 if port can be placed timely  -Lab today     No problem-specific Assessment & Plan notes found for this encounter.   Orders Placed This Encounter  Procedures  . IR Perc Tun  Perit Cath W/Port    Standing Status:   Future    Standing Expiration Date:   03/16/2019    Order Specific Question:   Reason for exam:    Answer:   chemo    Order Specific Question:   Preferred Imaging Location?    Answer:   [facility]   All questions were answered. The patient knows to call the clinic with any problems, questions or concerns. No barriers to learning was detected. I spent 35 minutes counseling the patient face to face. The total time spent in the appointment was 45 minutes and more than 50% was on counseling and review of test results  I, Noor Dweik am acting as scribe for Dr. Truitt Merle.  I have reviewed the above documentation for accuracy and completeness, and I agree with the above.      Truitt Merle, MD 01/13/2018

## 2018-01-14 ENCOUNTER — Other Ambulatory Visit: Payer: Self-pay | Admitting: Hematology

## 2018-01-14 DIAGNOSIS — C251 Malignant neoplasm of body of pancreas: Secondary | ICD-10-CM

## 2018-01-14 MED ORDER — PROCHLORPERAZINE MALEATE 10 MG PO TABS: 10.0000 mg | ORAL_TABLET | Freq: Four times a day (QID) | ORAL | 1 refills | Status: DC | PRN

## 2018-01-14 MED ORDER — ONDANSETRON HCL 8 MG PO TABS: 8.0000 mg | ORAL_TABLET | Freq: Two times a day (BID) | ORAL | 1 refills | Status: DC | PRN

## 2018-01-14 MED ORDER — LIDOCAINE-PRILOCAINE 2.5-2.5 % EX CREA: TOPICAL_CREAM | CUTANEOUS | 3 refills | Status: DC

## 2018-01-14 NOTE — Progress Notes (Signed)
START ON PATHWAY REGIMEN - Pancreatic     A cycle is every 14 days:     Oxaliplatin      Leucovorin      Irinotecan      5-Fluorouracil      5-Fluorouracil   **Always confirm dose/schedule in your pharmacy ordering system**  Patient Characteristics: Adenocarcinoma, Locally Advanced, Anatomically Unresectable, First Line, PS = 0, 1 Histology: Adenocarcinoma Current evidence of distant metastases<= No AJCC T Category: T3 AJCC N Category: N0 AJCC M Category: M0 AJCC 8 Stage Grouping: IIA Line of Therapy: First Line  Intent of Therapy: Non-Curative / Palliative Intent, Discussed with Patient

## 2018-01-15 ENCOUNTER — Other Ambulatory Visit: Payer: Self-pay | Admitting: Hematology

## 2018-01-15 ENCOUNTER — Inpatient Hospital Stay: Payer: Medicare Other

## 2018-01-19 ENCOUNTER — Other Ambulatory Visit: Payer: Self-pay | Admitting: Radiology

## 2018-01-20 ENCOUNTER — Telehealth: Payer: Self-pay | Admitting: Hematology

## 2018-01-20 ENCOUNTER — Other Ambulatory Visit: Payer: Self-pay

## 2018-01-20 ENCOUNTER — Ambulatory Visit (HOSPITAL_COMMUNITY)
Admission: RE | Admit: 2018-01-20 | Discharge: 2018-01-20 | Disposition: A | Discharge status: Home / Self Care | Payer: Medicare Other | Source: Ambulatory Visit | Attending: Hematology | Admitting: Hematology

## 2018-01-20 ENCOUNTER — Encounter (HOSPITAL_COMMUNITY): Payer: Self-pay

## 2018-01-20 DIAGNOSIS — K219 Gastro-esophageal reflux disease without esophagitis: Secondary | ICD-10-CM | POA: Insufficient documentation

## 2018-01-20 DIAGNOSIS — E1151 Type 2 diabetes mellitus with diabetic peripheral angiopathy without gangrene: Secondary | ICD-10-CM | POA: Insufficient documentation

## 2018-01-20 DIAGNOSIS — Z87891 Personal history of nicotine dependence: Secondary | ICD-10-CM | POA: Diagnosis not present

## 2018-01-20 DIAGNOSIS — Z7902 Long term (current) use of antithrombotics/antiplatelets: Secondary | ICD-10-CM | POA: Diagnosis not present

## 2018-01-20 DIAGNOSIS — Z8673 Personal history of transient ischemic attack (TIA), and cerebral infarction without residual deficits: Secondary | ICD-10-CM | POA: Insufficient documentation

## 2018-01-20 DIAGNOSIS — J449 Chronic obstructive pulmonary disease, unspecified: Secondary | ICD-10-CM | POA: Insufficient documentation

## 2018-01-20 DIAGNOSIS — Z7984 Long term (current) use of oral hypoglycemic drugs: Secondary | ICD-10-CM | POA: Diagnosis not present

## 2018-01-20 DIAGNOSIS — Z5111 Encounter for antineoplastic chemotherapy: Secondary | ICD-10-CM | POA: Diagnosis not present

## 2018-01-20 DIAGNOSIS — I10 Essential (primary) hypertension: Secondary | ICD-10-CM | POA: Insufficient documentation

## 2018-01-20 DIAGNOSIS — Z7982 Long term (current) use of aspirin: Secondary | ICD-10-CM | POA: Insufficient documentation

## 2018-01-20 DIAGNOSIS — C251 Malignant neoplasm of body of pancreas: Secondary | ICD-10-CM

## 2018-01-20 DIAGNOSIS — I2510 Atherosclerotic heart disease of native coronary artery without angina pectoris: Secondary | ICD-10-CM | POA: Diagnosis not present

## 2018-01-20 DIAGNOSIS — E785 Hyperlipidemia, unspecified: Secondary | ICD-10-CM | POA: Diagnosis not present

## 2018-01-20 DIAGNOSIS — C259 Malignant neoplasm of pancreas, unspecified: Secondary | ICD-10-CM | POA: Insufficient documentation

## 2018-01-20 DIAGNOSIS — M1990 Unspecified osteoarthritis, unspecified site: Secondary | ICD-10-CM | POA: Insufficient documentation

## 2018-01-20 DIAGNOSIS — Z951 Presence of aortocoronary bypass graft: Secondary | ICD-10-CM | POA: Insufficient documentation

## 2018-01-20 HISTORY — PX: IR IMAGING GUIDED PORT INSERTION: IMG5740

## 2018-01-20 LAB — BASIC METABOLIC PANEL
ANION GAP: 7 (ref 5–15)
BUN: 12 mg/dL (ref 8–23)
CALCIUM: 9.4 mg/dL (ref 8.9–10.3)
CO2: 28 mmol/L (ref 22–32)
Chloride: 101 mmol/L (ref 98–111)
Creatinine, Ser: 0.79 mg/dL (ref 0.61–1.24)
GFR calc Af Amer: >60 mL/min (ref >60)
GFR calc non Af Amer: >60 mL/min (ref >60)
GLUCOSE: 129 mg/dL — AB (ref 70–99)
Potassium: 3.6 mmol/L (ref 3.5–5.1)
Sodium: 136 mmol/L (ref 135–145)

## 2018-01-20 LAB — GLUCOSE, CAPILLARY
GLUCOSE-CAPILLARY: 138 mg/dL — AB (ref 70–99)
Glucose-Capillary: 212 mg/dL — ABNORMAL HIGH (ref 70–99)

## 2018-01-20 LAB — CBC
HCT: 40.8 % (ref 39.0–52.0)
HEMOGLOBIN: 12.9 g/dL — AB (ref 13.0–17.0)
MCH: 29.3 pg (ref 26.0–34.0)
MCHC: 31.6 g/dL (ref 30.0–36.0)
MCV: 92.7 fL (ref 78.0–100.0)
Platelets: 209 10*3/uL (ref 150–400)
RBC: 4.4 MIL/uL (ref 4.22–5.81)
RDW: 11.9 % (ref 11.5–15.5)
WBC: 4.3 10*3/uL (ref 4.0–10.5)

## 2018-01-20 LAB — PROTIME-INR
INR: 0.94
PROTHROMBIN TIME: 12.4 s (ref 11.4–15.2)

## 2018-01-20 MED ORDER — FENTANYL CITRATE (PF) 100 MCG/2ML IJ SOLN
INTRAMUSCULAR | Status: AC | PRN
  Administered 2018-01-20 (×2): 50 ug via INTRAVENOUS

## 2018-01-20 MED ORDER — CEFAZOLIN SODIUM-DEXTROSE 2-4 GM/100ML-% IV SOLN
2.0000 g | INTRAVENOUS | Status: AC
  Administered 2018-01-20: 2 g via INTRAVENOUS

## 2018-01-20 MED ORDER — MIDAZOLAM HCL 2 MG/2ML IJ SOLN
INTRAMUSCULAR | Status: AC
  Filled 2018-01-20: qty 4

## 2018-01-20 MED ORDER — FENTANYL CITRATE (PF) 100 MCG/2ML IJ SOLN
INTRAMUSCULAR | Status: AC
  Filled 2018-01-20: qty 4

## 2018-01-20 MED ORDER — LIDOCAINE-EPINEPHRINE (PF) 1 %-1:200000 IJ SOLN
INTRAMUSCULAR | Status: AC | PRN
  Administered 2018-01-20: 20 mL

## 2018-01-20 MED ORDER — SODIUM CHLORIDE 0.9 % IV SOLN: INTRAVENOUS | Status: DC

## 2018-01-20 MED ORDER — CEFAZOLIN SODIUM-DEXTROSE 2-4 GM/100ML-% IV SOLN
INTRAVENOUS | Status: AC
  Administered 2018-01-20: 2 g via INTRAVENOUS
  Filled 2018-01-20: qty 100

## 2018-01-20 MED ORDER — MIDAZOLAM HCL 2 MG/2ML IJ SOLN
INTRAMUSCULAR | Status: AC | PRN
  Administered 2018-01-20 (×2): 1 mg via INTRAVENOUS

## 2018-01-20 MED ORDER — LIDOCAINE-EPINEPHRINE (PF) 1 %-1:200000 IJ SOLN
INTRAMUSCULAR | Status: AC
  Filled 2018-01-20: qty 30

## 2018-01-20 MED ORDER — HEPARIN SOD (PORK) LOCK FLUSH 100 UNIT/ML IV SOLN
INTRAVENOUS | Status: AC
  Administered 2018-01-20: 500 [IU]
  Filled 2018-01-20: qty 5

## 2018-01-20 NOTE — Discharge Instructions (Signed)
Implanted Port Insertion, Care After °This sheet gives you information about how to care for yourself after your procedure. Your health care provider may also give you more specific instructions. If you have problems or questions, contact your health care provider. °What can I expect after the procedure? °After your procedure, it is common to have: °· Discomfort at the port insertion site. °· Bruising on the skin over the port. This should improve over 3-4 days. ° °Follow these instructions at home: °Port care °· After your port is placed, you will get a manufacturer's information card. The card has information about your port. Keep this card with you at all times. °· Take care of the port as told by your health care provider. Ask your health care provider if you or a family member can get training for taking care of the port at home. A home health care nurse may also take care of the port. °· Make sure to remember what type of port you have. °Incision care °· Follow instructions from your health care provider about how to take care of your port insertion site. Make sure you: °? Wash your hands with soap and water before you change your bandage (dressing). If soap and water are not available, use hand sanitizer. °? Change your dressing as told by your health care provider. °? Leave stitches (sutures), skin glue, or adhesive strips in place. These skin closures may need to stay in place for 2 weeks or longer. If adhesive strip edges start to loosen and curl up, you may trim the loose edges. Do not remove adhesive strips completely unless your health care provider tells you to do that. °· Check your port insertion site every day for signs of infection. Check for: °? More redness, swelling, or pain. °? More fluid or blood. °? Warmth. °? Pus or a bad smell. °General instructions °· Do not take baths, swim, or use a hot tub until your health care provider approves. °· Do not lift anything that is heavier than 10 lb (4.5  kg) for a week, or as told by your health care provider. °· Ask your health care provider when it is okay to: °? Return to work or school. °? Resume usual physical activities or sports. °· Do not drive for 24 hours if you were given a medicine to help you relax (sedative). °· Take over-the-counter and prescription medicines only as told by your health care provider. °· Wear a medical alert bracelet in case of an emergency. This will tell any health care providers that you have a port. °· Keep all follow-up visits as told by your health care provider. This is important. °Contact a health care provider if: °· You cannot flush your port with saline as directed, or you cannot draw blood from the port. °· You have a fever or chills. °· You have more redness, swelling, or pain around your port insertion site. °· You have more fluid or blood coming from your port insertion site. °· Your port insertion site feels warm to the touch. °· You have pus or a bad smell coming from the port insertion site. °Get help right away if: °· You have chest pain or shortness of breath. °· You have bleeding from your port that you cannot control. °Summary °· Take care of the port as told by your health care provider. °· Change your dressing as told by your health care provider. °· Keep all follow-up visits as told by your health care provider. °  This information is not intended to replace advice given to you by your health care provider. Make sure you discuss any questions you have with your health care provider. °Document Released: 01/27/2013 Document Revised: 02/28/2016 Document Reviewed: 02/28/2016 °[employer] Interactive Patient Education © 2017 [employer] ° °

## 2018-01-20 NOTE — Telephone Encounter (Signed)
Notified patient of scheduled appointment per 9/24 los

## 2018-01-20 NOTE — Progress Notes (Signed)
[place]  [phone] [phone]  Clinic Follow up Note   Patient Care Team: Nolene Ebbs, MD as PCP - General (Internal Medicine) Lorretta Harp, MD as PCP - Cardiology (Cardiology) Carol Ada, MD as Consulting Physician (Gastroenterology) Truitt Merle, MD as Consulting Physician (Hematology) 01/21/2018  SUMMARY OF ONCOLOGIC HISTORY: Oncology History   Cancer Staging Malignant neoplasm of body of pancreas [facility]) Staging form: Exocrine Pancreas, AJCC 8th Edition - Clinical stage from 12/19/2017: Stage IIA (cT3, cN0, cM0) - Signed by Truitt Merle, MD on 01/13/2018       Malignant neoplasm of body of pancreas ([place])   12/11/2017 Imaging    CT AP WITH CONTRAST IMPRESSION: Focal area of abnormal low attenuation in the pancreatic body measuring 3.7 x 2.9 cm with cut off of the duct most concerning for primary pancreatic neoplasm.  2 small low-attenuation foci identified within the liver which are nonspecific however could represent metastatic disease; one is 1.0 cm complex lesion within the hepatic dome, the other area is a low-attenuation focus adjacent to the falciform ligament likely representing a perfusion anomaly or possible local fatty infiltration  Extensive atherosclerotic changes in the abdominal aorta, iliac vessels, splenic vessels.  Occlusion of the right and possibly left iliac.  Suspected chronic re-constitution of flow identified within the femoral vessels bilaterally.  Prostate brachytherapy consistent with history of malignancy  No evidence of small bowel obstruction.  Findings suggestive of constipation     12/19/2017 Procedure    EUS biopsy: In the body of the pancreas there was a large 20 x 30 mm, approximately, hypoechoic mass. It had irregular margin, but it was round/oval in appearance. There was pancreatic tail atrophy, but the PD was dilated up to 7.6 mm. The celiac axis was clearly viewed and there was no evidence of  invasion. In the head of the pancreas a 1 cm lymph node was identified and the pancreas was lobular in the head and uncinate process. The left adrenal was clearly identified and it was enlarged, but there was no evidence of any masses.    12/19/2017 Initial Biopsy    Diagnosis FINE NEEDLE ASPIRATION,ENDOSCOPIC, PANCREAS BODY (SPECIMEN 1 OF 1 COLLECTED 12/19/17): MALIGNANT CELLS CONSISTENT WITH ADENOCARCINOMA.    12/19/2017 Initial Diagnosis    Malignant neoplasm of body of pancreas ([place])    12/19/2017 Cancer Staging    Staging form: Exocrine Pancreas, AJCC 8th Edition - Clinical stage from 12/19/2017: Stage IIA (cT3, cN0, cM0) - Signed by Truitt Merle, MD on 01/13/2018    01/05/2018 Imaging    01/05/2018 MRI Abdomen IMPRESSION: 1. Poorly marginated hypoenhancing 4.2 x 2.5 x 5.2 cm pancreatic body mass with pancreatic tail atrophy and duct dilation, compatible with known pancreatic adenocarcinoma. Pancreatic tumor appears locally advanced with significant vascular involvement as detailed. No biliary ductal dilatation. 2. No abdominal adenopathy. 3. Two similar subcentimeter enhancing left liver lobe masses as detailed, which are too small to accurately characterize on this mildly motion degraded scan. Differential includes small metastases or hemangiomas. Suggest follow-up MRI abdomen without and with IV contrast in 3 months. These lesions are probably too small to characterize by PET-CT. 4.  Aortic Atherosclerosis (ICD10-I70.0).    01/06/2018 Imaging    01/06/2018 CT Chest IMPRESSION: 1. Two non-specific small left lower lobe pulmonary nodules. Otherwise, no evidence of metastatic disease within the chest. 2.  Aortic Atherosclerosis (ICD10-I70.0).  Emphysema (ICD10-J43.9).    01/13/2018 Tumor Marker    Tumor Marker Results for Michael Maddox, Michael Maddox (  MRN [id number]) as of 01/13/2018 09:17  Ref. Range 12/30/2017 14:59  CA 19-9 Latest Ref Range: 0 - 35 U/mL 1,263 (H)      01/20/2018 -   Chemotherapy    The patient had palonosetron (ALOXI) injection 0.25 mg, 0.25 mg, Intravenous,  Once, 1 of 4 cycles pegfilgrastim-cbqv (UDENYCA) injection 6 mg, 6 mg, Subcutaneous, Once, 1 of 4 cycles irinotecan (CAMPTOSAR) 260 mg in dextrose 5 % 500 mL chemo infusion, 150 mg/m2 = 260 mg (100 % of original dose 150 mg/m2), Intravenous,  Once, 1 of 4 cycles Dose modification: 150 mg/m2 (original dose 150 mg/m2, Cycle 1, Reason: Provider Judgment) leucovorin 676 mg in sodium chloride 0.9 % 250 mL infusion, 400 mg/m2 = 676 mg, Intravenous,  Once, 1 of 4 cycles oxaliplatin (ELOXATIN) 120 mg in dextrose 5 % 500 mL chemo infusion, 70 mg/m2 = 120 mg (100 % of original dose 70 mg/m2), Intravenous,  Once, 1 of 4 cycles Dose modification: 70 mg/m2 (original dose 70 mg/m2, Cycle 1, Reason: Provider Judgment, Comment: first cycle only ) fluorouracil (ADRUCIL) chemo injection 700 mg, 400 mg/m2 = 700 mg, Intravenous,  Once, 1 of 4 cycles fluorouracil (ADRUCIL) 4,050 mg in sodium chloride 0.9 % 69 mL chemo infusion, 2,400 mg/m2 = 4,050 mg, Intravenous, 1 Day/Dose, 1 of 4 cycles  for chemotherapy treatment.    CURRENT THERAPY: first line chemo FOLFIRINOX every 2 weeks; starting 01/21/18. Oxaliplatin and Irinotecan dose reduced with cycle 1  INTERVAL HISTORY: Michael Maddox is seen in the infusion room today for cycle 1 FOLFIRINOX. He continues to have abdominal pain 7/10, tramadol did not help. He was prescribed norco 5-325, he takes 1 every 5-6 hours, which also does not help much. He eventually gets comfortable after 3 hours. Relief does not last long. He doubled dose of senokot which maintains regular BM. Denies n/v. He continues to have low appetite and thinks he lost more weight. Tolerated PAC placement well on 10/1, with mild soreness. He attended chemo class. Has not picked up antiemetics. Denies fever, chills, cough, chest pain, dyspnea, edema, or baseline neuropathy.    MEDICAL HISTORY:  Past Medical History:   Diagnosis Date  . Arthritis   . Carotid artery disease ([place]) followed by dr berry--- per last duplex [id number]  LICA 57-01%, bilateral ECA >50%,  patent RICA post intervention   hx acute high grade stenosis RICA >80% 02-22-2009 in setting of  stroke  s/p  right carotid endarterectomy 02-28-2009/    . Claudication of both lower extremities ([place])    due to PAD ----  right > left  . COPD (chronic obstructive pulmonary disease) ([place])   . Coronary artery disease CARDIOLOGIST-  DR BERRY   hx positive myoview 07-29-2013;  08-16-2013 per cardiac cath, severe 3V CAD w/ 60% subclavian stenosis;  08-23-2013   s/p  CABG x3  (LIMA to LAD, SVG to OM1, SVG to PDA)  . CVA (cerebral vascular accident) ([place]) 02/22/2009   acute infarct right hemisphere w/ severe RICA stentosis----  per pt no residuals  . GERD (gastroesophageal reflux disease)   . HTN (hypertension)   . Hyperlipidemia   . Hyperplasia of prostate with lower urinary tract symptoms (LUTS)   . Peripheral arterial disease ([place]) followed by dr berry--- last dobbler ABIs bilateral SFA occlusions (pt is scheduled for intervention 05-08-2017   hx PTA and stenting to bilateral SFA 2002 and PTA w/ stenting left common iliac artery and for in-stent restenosis 2003 Left SFA and 2005  stenting  right SFA for in-stent restenosis  . Prostate cancer [facility]) urologist-  dr ottelin/  oncologist-  dr Tammi Klippel   dx 11-20-2016-- Stage T1c, Gleason 4+3, PSA 5.73, vol 35.95cc--- scheduled for radioactive seed implants 04-11-2017  . 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 S/P peripheral artery angioplasty with stent placement    bilateral SFA 2002;  in-stenosis bilateral SFA  (right 2003, left 2005) and left CIA 2003  . Shortness of breath   . Type II diabetes mellitus ([place])   . Weak urinary stream   . Wears glasses     SURGICAL HISTORY: Past Surgical History:  Procedure Laterality Date  . ABDOMINAL AORTOGRAM N/A 05/08/2017   Procedure:  ABDOMINAL AORTOGRAM;  Surgeon: Lorretta Harp, MD;  Location: [place] CV LAB;  Service: Cardiovascular;  Laterality: N/A;  . CARDIAC CATHETERIZATION  12-01-2000    dr berry    non-critial CAD & nonischemic cardiolite-- mLAD 50%, mLCx 70%, prox. to mid RCA 75% focal midsegment 80-85%  . CARDIOVASCULAR STRESS TEST  07-29-2013    dr berry   High risk nuclear study w/ inferoseptal ischemia/  normal LV function and wall motion,  nuclear study ef 52%  . CAROTID ENDARTERECTOMY Right 02-28-2009    dr c. Scot Dock  [facility]   patch angioplasty  . COLONOSCOPY    . CORONARY ARTERY BYPASS GRAFT N/A 08/23/2013   Procedure: CORONARY ARTERY BYPASS GRAFTING (CABG);  Surgeon: Melrose Nakayama, MD;  Location: [place];  Service: Open Heart Surgery;  Laterality: N/A;  Coronary Artery Bypass graft times three using left internal mammary artery and left leg saphenous vein   . CYSTOSCOPY N/A 04/11/2017   Procedure: CYSTOSCOPY FLEXIBLE;  Surgeon: Kathie Rhodes, MD;  Location: [facility];  Service: Urology;  Laterality: N/A;  no seeds found in bladder  . ENDOVASCULAR STENT INSERTION Bilateral right 12-25-2000 ;  left 03-02-2001   dr berry   PTA and stenting to bilateral SFA  . ENDOVASCULAR STENT INSERTION Bilateral right 08-30-2003 ;  left 11-10-2001    dr berry   PTA and stenting for in-stent restenosis right SFA 08-30-2003:  PTA and stenting distal left common iliac artery and in-stent restenosis left SFA 11-10-2001  . FINE NEEDLE ASPIRATION N/A 12/19/2017   Procedure: FINE NEEDLE ASPIRATION (FNA) LINEAR;  Surgeon: Carol Ada, MD;  Location: [facility];  Service: Endoscopy;  Laterality: N/A;  . INTRAOPERATIVE TRANSESOPHAGEAL ECHOCARDIOGRAM N/A 08/23/2013   Procedure: INTRAOPERATIVE TRANSESOPHAGEAL ECHOCARDIOGRAM;  Surgeon: Melrose Nakayama, MD;  Location: [place];  Service: Open Heart Surgery;  Laterality: N/A;  . IR IMAGING GUIDED PORT INSERTION  01/20/2018  . LEFT HEART CATHETERIZATION WITH  CORONARY ANGIOGRAM N/A 08/16/2013   Procedure: LEFT HEART CATHETERIZATION WITH CORONARY ANGIOGRAM;  Surgeon: Lorretta Harp, MD;  Location: [facility] CATH LAB;  Service: Cardiovascular;  Laterality: N/A;   severe 3V CAD and 60% subclavian sternosis  . LOWER EXTREMITY ANGIOGRAM N/A 08/16/2013   Procedure: LOWER EXTREMITY ANGIOGRAM;  Surgeon: Lorretta Harp, MD;  Location: [facility] CATH LAB;  Service: Cardiovascular;  Laterality: N/A;  . LOWER EXTREMITY ANGIOGRAPHY Bilateral 05/08/2017   Procedure: Lower Extremity Angiography;  Surgeon: Lorretta Harp, MD;  Location: [place] CV LAB;  Service: Cardiovascular;  Laterality: Bilateral;  . PERIPHERAL VASCULAR BALLOON ANGIOPLASTY Left 05/08/2017   Procedure: PERIPHERAL VASCULAR BALLOON ANGIOPLASTY;  Surgeon: Lorretta Harp, MD;  Location: [place] CV LAB;  Service: Cardiovascular;  Laterality: Left;  COMMON ILIAC  . RADIOACTIVE SEED IMPLANT N/A 04/11/2017   Procedure: RADIOACTIVE SEED IMPLANT/BRACHYTHERAPY IMPLANT;  Surgeon: Kathie Rhodes, MD;  Location: [facility];  Service: Urology;  Laterality: N/A;    67   seeds implanted  . SPACE OAR INSTILLATION N/A 04/11/2017   Procedure: SPACE OAR INSTILLATION;  Surgeon: Kathie Rhodes, MD;  Location: [facility];  Service: Urology;  Laterality: N/A;  . TRANSTHORACIC ECHOCARDIOGRAM  07/15/2013   ef 19-37%, grade 1 diastolic dysfunction/  trivial MR, mild to moderate PR, mild TR   . UPPER ESOPHAGEAL ENDOSCOPIC ULTRASOUND (EUS) N/A 12/19/2017   Procedure: UPPER ESOPHAGEAL ENDOSCOPIC ULTRASOUND (EUS);  Surgeon: Carol Ada, MD;  Location: [facility];  Service: Endoscopy;  Laterality: N/A;    I have reviewed the social history and family history with the patient and they are unchanged from previous note.  ALLERGIES:  has No Known Allergies.  MEDICATIONS:  Current Outpatient Medications  Medication Sig Dispense Refill  . aspirin 81 MG chewable tablet Chew 1 tablet (81 mg total) by  mouth daily. (Patient taking differently: Chew 81 mg by mouth at bedtime. )    . cilostazol (PLETAL) 50 MG tablet Take 1 tablet (50 mg total) by mouth 2 (two) times daily. 60 tablet 6  . clopidogrel (PLAVIX) 75 MG tablet Take 75 mg by mouth at bedtime.     Mar[place] Kitchen HYDROcodone-acetaminophen (NORCO) 5-325 MG tablet Take 1-2 tablets by mouth every 6 (six) hours as needed for moderate pain. 45 tablet 0  . metFORMIN (GLUCOPHAGE) 500 MG tablet Take 500 mg by mouth at bedtime.   2  . metoprolol succinate (TOPROL-XL) 25 MG 24 hr tablet Take 25 mg by mouth at bedtime.     . simvastatin (ZOCOR) 20 MG tablet Take 20 mg by mouth at bedtime.     . tamsulosin (FLOMAX) 0.4 MG CAPS capsule Take 0.4 mg by mouth every evening.  11  . dexlansoprazole (DEXILANT) 60 MG capsule Take 60 mg by mouth daily as needed (for reflux.).     Marland Kitchen doxycycline (VIBRAMYCIN) 100 MG capsule Take 100 mg by mouth daily as needed for itching.  1  . hydrocortisone 2.5 % ointment Apply 1 application topically 2 (two) times daily as needed (for skin irritation).   0  . lidocaine-prilocaine (EMLA) cream Apply 1 application topically as needed (local anesthesia).    . mirtazapine (REMERON) 7.5 MG tablet Take 1 tablet (7.5 mg total) by mouth at bedtime. 30 tablet 1  . Multiple Vitamins-Minerals (MULTIVITAMIN WITH MINERALS) tablet Take 1 tablet by mouth daily.    . ondansetron (ZOFRAN) 8 MG tablet Take 1 tablet (8 mg total) by mouth 2 (two) times daily as needed for refractory nausea / vomiting. Start on day 3 after chemotherapy. 30 tablet 1  . prochlorperazine (COMPAZINE) 10 MG tablet Take 1 tablet (10 mg total) by mouth every 6 (six) hours as needed (NAUSEA). 30 tablet 1  . traMADol (ULTRAM) 50 MG tablet Take 50 mg by mouth every 6 (six) hours as needed (pain).      No current facility-administered medications for this visit.    Facility-Administered Medications Ordered in Other Visits  Medication Dose Route Frequency Provider Last Rate Last Dose    . fluorouracil (ADRUCIL) 4,050 mg in sodium chloride 0.9 % 69 mL chemo infusion  2,400 mg/m2 (Treatment Plan Recorded) Intravenous 1 day or 1 dose Truitt Merle, MD      . fluorouracil (ADRUCIL) chemo injection 700 mg  400 mg/m2 (  Treatment Plan Recorded) Intravenous Once Truitt Merle, MD      . heparin lock flush 100 unit/mL  500 Units Intracatheter Once PRN Truitt Merle, MD      . irinotecan (CAMPTOSAR) 260 mg in sodium chloride 0.9 % 500 mL chemo infusion  150 mg/m2 (Treatment Plan Recorded) Intravenous Once Truitt Merle, MD 342 mL/hr at 01/21/18 1306 260 mg at 01/21/18 1306  . leucovorin 676 mg in sodium chloride 0.9 % 250 mL infusion  400 mg/m2 (Treatment Plan Recorded) Intravenous Once Truitt Merle, MD 189 mL/hr at 01/21/18 1305 676 mg at 01/21/18 1305  . sodium chloride flush (NS) 0.9 % injection 10 mL  10 mL Intracatheter PRN Truitt Merle, MD        PHYSICAL EXAMINATION: ECOG PERFORMANCE STATUS: 1 - Symptomatic but completely ambulatory BP 119/88 P 78 T 98.3 RR 16  GENERAL:alert, no distress and comfortable SKIN: no rashes or significant lesions EYES:  sclera clear OROPHARYNX:no thrush or ulcers  LYMPH:  no palpable cervical or supraclavicular lymphadenopathy  LUNGS: clear to auscultation with normal breathing effort HEART: regular rate & rhythm, no lower extremity edema ABDOMEN: abdomen soft, normal bowel sounds. Mild epigastric tenderness. No hepatomegaly  Musculoskeletal:no cyanosis of digits and no clubbing  NEURO: alert & oriented x 3 with fluent speech, no focal motor/sensory deficits PAC covered with gauze, no obvious erythema    LABORATORY DATA:  I have reviewed the data as listed CBC Latest Ref Rng & Units 01/21/2018 01/20/2018 01/13/2018  WBC 4.0 - 10.3 K/uL 4.5 4.3 3.2(L)  Hemoglobin 13.0 - 17.1 g/dL 13.3 12.9(L) 13.0  Hematocrit 38.4 - 49.9 % 39.2 40.8 40.8  Platelets 140 - 400 K/uL 229 209 198     CMP Latest Ref Rng & Units 01/21/2018 01/20/2018 01/13/2018  Glucose 70 - 99 mg/dL  170(H) 129(H) 163(H)  BUN 8 - 23 mg/dL _0 Creatinine 0.61 - 1.24 mg/dL 0.82 0.79 0.95  Sodium 135 - 145 mmol/L 138 136 139  Potassium 3.5 - 5.1 mmol/L 4.7 3.6 4.6  Chloride 98 - 111 mmol/L 101 101 99  CO2 22 - 32 mmol/L 26 28 33(H)  Calcium 8.9 - 10.3 mg/dL 9.9 9.4 10.2  Total Protein 6.5 - 8.1 g/dL 7.4 - 7.6  Total Bilirubin 0.3 - 1.2 mg/dL 0.6 - 0.5  Alkaline Phos 38 - 126 U/L 42 - 52  AST 15 - 41 U/L 22 - 13(L)  ALT 0 - 44 U/L 16 - 14      RADIOGRAPHIC STUDIES: I have personally reviewed the radiological images as listed and agreed with the findings in the report. Ir Imaging Guided Port Insertion  Result Date: 01/20/2018 INDICATION: History of pancreatic cancer. In need of durable intravenous access for chemotherapy administration. EXAM: IMPLANTED PORT A CATH PLACEMENT WITH ULTRASOUND AND FLUOROSCOPIC GUIDANCE COMPARISON:  Chest CT - 01/05/2018 MEDICATIONS: Ancef 2 gm IV; The antibiotic was administered within an appropriate time interval prior to skin puncture. ANESTHESIA/SEDATION: Moderate (conscious) sedation was employed during this procedure. A total of Versed 2 mg and Fentanyl 100 mcg was administered intravenously. Moderate Sedation Time: 20 minutes. The patient's level of consciousness and vital signs were monitored continuously by radiology nursing throughout the procedure under my direct supervision. CONTRAST:  None FLUOROSCOPY TIME:  18 seconds (1 mGy) COMPLICATIONS: None immediate. PROCEDURE: The procedure, risks, benefits, and alternatives were explained to the patient. Questions regarding the procedure were encouraged and answered. The patient understands and consents to the procedure. The right neck  and chest were prepped with chlorhexidine in a sterile fashion, and a sterile drape was applied covering the operative field. Maximum barrier sterile technique with sterile gowns and gloves were used for the procedure. A timeout was performed prior to the initiation of the  procedure. Local anesthesia was provided with 1% lidocaine with epinephrine. After creating a small venotomy incision, a micropuncture kit was utilized to access the internal jugular vein. Real-time ultrasound guidance was utilized for vascular access including the acquisition of a permanent ultrasound image documenting patency of the accessed vessel. The microwire was utilized to measure appropriate catheter length. A subcutaneous port pocket was then created along the upper chest wall utilizing a combination of sharp and blunt dissection. The pocket was irrigated with sterile saline. A single lumen thin power injectable port was chosen for placement. The 8 Fr catheter was tunneled from the port pocket site to the venotomy incision. The port was placed in the pocket. The external catheter was trimmed to appropriate length. At the venotomy, an 8 Fr peel-away sheath was placed over a guidewire under fluoroscopic guidance. The catheter was then placed through the sheath and the sheath was removed. Final catheter positioning was confirmed and documented with a fluoroscopic spot radiograph. The port was accessed with a Huber needle, aspirated and flushed with heparinized saline. The venotomy site was closed with an interrupted 4-0 Vicryl suture. The port pocket incision was closed with interrupted 2-0 Vicryl suture and the skin was opposed with a running subcuticular 4-0 Vicryl suture. Dermabond and Steri-strips were applied to both incisions. Dressings were placed. The patient tolerated the procedure well without immediate post procedural complication. FINDINGS: After catheter placement, the tip lies within the superior cavoatrial junction. The catheter aspirates and flushes normally and is ready for immediate use. IMPRESSION: Successful placement of a right internal jugular approach power injectable Port-A-Cath. The catheter is ready for immediate use. Electronically Signed   By: Sandi Mariscal M.D.   On: 01/20/2018  17:01     ASSESSMENT & PLAN: Michael Maddox is a pleasant 67 y.o. male with history of prostate cancer presented with abdominal pain, 30 lbs weight loss, and constipation worsening over 3-4 months   1. Adenocarcinoma of body of pancreas, cT3N0Mx, unresectable, with indeterminate lung nodes and liver lesions  2. Adenocarcinoma of the prostate stage T1c -on 11/20/2016 with gleason score 4+3 and PSA 5.73 s/p radioactive seed implant per Dr. Tammi Klippel on 04/11/18, followed by Dr. Karsten Ro. 3.HTN, HL,DM -ON plavix, aspirin, metformin -followed by PCP Dr. Jeanie Cooks 4.TIA in 2010 s/p endarterectomy, CAD s/p CABG in 2015, PVD -requiring revascularization and stenting in the LEs and on anticoagulation with plavixand aspirin; followed by cardiologist Dr. [name].  5. Genetics  -we previously refer pt to genetics  6. Goal of care discussion  -The patient understands the goal of care is palliative. -He is full code now   Michael Maddox appears stable. He tolerated PAC placement and attended chemo education class. He will pick up anti-emetics today, we reviewed instructions. We reviewed claritin dosing for bone pain with udenyca. He is ready to begin FOLFIRINOX chemotherapy, with mild dose reductions in oxaliplatin and irinotecan for cycle 1. Labs reviewed, adequate to proceed.  For anorexia and decreased appetite, I recommend starting low dose mirtazapine 7.5 mg po qHS. I reviewed potential side effects. He agrees. I prescribed to his pharmacy today.   For his abdominal pain that is not well controlled with norco 5-325 q6 hours, I increased his dose to 1-2 tabs  q6 PRN. We can titrate pain meds as needed. Hopefully with chemo his pain will improve. He did not bring home supply with him today, I will prescribe norco 5-325 mg x1 here in infusion today.   PLAN: -Labs reviewed, proceed with cycle 1 FOLFIRINOX today, oxaliplatin and irinotecan dose reductions  -Pump d/c and udenyca on day 3 -Pick up zofran,  compazine -New Rx for Norco (increased dose) and Remeron  -Reviewed claritin for bone pain   All questions were answered. The patient knows to call the clinic with any problems, questions or concerns. No barriers to learning was detected. I spent 20 minutes counseling the patient face to face. The total time spent in the appointment was 25 minutes and more than 50% was on counseling and review of test results     Alla Feeling, NP 01/21/18

## 2018-01-20 NOTE — Sedation Documentation (Signed)
Patient is resting comfortably. 

## 2018-01-20 NOTE — Procedures (Signed)
Pre Procedure Dx: Pancreatic Cancer Post Procedural Dx: Same  Successful placement of right IJ approach port-a-cath with tip at the superior caval atrial junction. The catheter is ready for immediate use.  Estimated Blood Loss: Minimal  Complications: None immediate.  Ronny Bacon, MD Pager #: [phone]

## 2018-01-20 NOTE — H&P (Addendum)
Chief Complaint: Patient was seen in consultation today for [facility] a cath placement at the request of Feng,Yan  Referring Physician(s): Feng,Yan  Supervising Physician: Sandi Mariscal  Patient Status: [facility] - Out-pt  History of Present Illness: Michael Maddox is a 67 y.o. male   Hx Prostate cancer New pancreatic cancer Diagnosis FINE NEEDLE ASPIRATION,ENDOSCOPIC, PANCREAS BODY (SPECIMEN 1 OF 1 COLLECTED 12/19/17): MALIGNANT CELLS CONSISTENT WITH ADENOCARCINOMA.  Scheduled for chemotherapy tomorrow Now for [facility] a cath placement LD Plavix 9/26    Past Medical History:  Diagnosis Date  . Arthritis   . Carotid artery disease ([place]) followed by dr berry--- per last duplex [id number]  LICA 66-44%, bilateral ECA >50%,  patent RICA post intervention   hx acute high grade stenosis RICA >80% 02-22-2009 in setting of  stroke  s/p  right carotid endarterectomy 02-28-2009/    . Claudication of both lower extremities ([place])    due to PAD ----  right > left  . COPD (chronic obstructive pulmonary disease) ([place])   . Coronary artery disease CARDIOLOGIST-  DR BERRY   hx positive myoview 07-29-2013;  08-16-2013 per cardiac cath, severe 3V CAD w/ 60% subclavian stenosis;  08-23-2013   s/p  CABG x3  (LIMA to LAD, SVG to OM1, SVG to PDA)  . CVA (cerebral vascular accident) ([place]) 02/22/2009   acute infarct right hemisphere w/ severe RICA stentosis----  per pt no residuals  . GERD (gastroesophageal reflux disease)   . HTN (hypertension)   . Hyperlipidemia   . Hyperplasia of prostate with lower urinary tract symptoms (LUTS)   . Peripheral arterial disease ([place]) followed by dr berry--- last dobbler ABIs bilateral SFA occlusions (pt is scheduled for intervention 05-08-2017   hx PTA and stenting to bilateral SFA 2002 and PTA w/ stenting left common iliac artery and for in-stent restenosis 2003 Left SFA and 2005  stenting right SFA for in-stent restenosis  . Prostate cancer [facility]) urologist-  dr ottelin/   oncologist-  dr Tammi Klippel   dx 11-20-2016-- Stage T1c, Gleason 4+3, PSA 5.73, vol 35.95cc--- scheduled for radioactive seed implants 04-11-2017  . 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 S/P peripheral artery angioplasty with stent placement    bilateral SFA 2002;  in-stenosis bilateral SFA  (right 2003, left 2005) and left CIA 2003  . Shortness of breath   . Type II diabetes mellitus ([place])   . Weak urinary stream   . Wears glasses     Past Surgical History:  Procedure Laterality Date  . ABDOMINAL AORTOGRAM N/A 05/08/2017   Procedure: ABDOMINAL AORTOGRAM;  Surgeon: Lorretta Harp, MD;  Location: [place] [facility];  Service: Cardiovascular;  Laterality: N/A;  . CARDIAC CATHETERIZATION  12-01-2000    dr berry    non-critial CAD & nonischemic cardiolite-- mLAD 50%, mLCx 70%, prox. to mid RCA 75% focal midsegment 80-85%  . CARDIOVASCULAR STRESS TEST  07-29-2013    dr berry   High risk nuclear study w/ inferoseptal ischemia/  normal LV function and wall motion,  nuclear study ef 52%  . CAROTID ENDARTERECTOMY Right 02-28-2009    dr c. Scot Dock  [facility]   patch angioplasty  . COLONOSCOPY    . CORONARY ARTERY BYPASS GRAFT N/A 08/23/2013   Procedure: CORONARY ARTERY BYPASS GRAFTING (CABG);  Surgeon: Melrose Nakayama, MD;  Location: [place];  Service: Open Heart Surgery;  Laterality: N/A;  Coronary Artery Bypass graft times three using  left internal mammary artery and left leg saphenous vein   . CYSTOSCOPY N/A 04/11/2017   Procedure: CYSTOSCOPY FLEXIBLE;  Surgeon: Kathie Rhodes, MD;  Location: [facility];  Service: Urology;  Laterality: N/A;  no seeds found in bladder  . ENDOVASCULAR STENT INSERTION Bilateral right 12-25-2000 ;  left 03-02-2001   dr berry   PTA and stenting to bilateral SFA  . ENDOVASCULAR STENT INSERTION Bilateral right 08-30-2003 ;  left 11-10-2001    dr berry   PTA and stenting for in-stent restenosis right SFA 08-30-2003:  PTA and  stenting distal left common iliac artery and in-stent restenosis left SFA 11-10-2001  . FINE NEEDLE ASPIRATION N/A 12/19/2017   Procedure: FINE NEEDLE ASPIRATION (FNA) LINEAR;  Surgeon: Carol Ada, MD;  Location: [facility];  Service: Endoscopy;  Laterality: N/A;  . INTRAOPERATIVE TRANSESOPHAGEAL ECHOCARDIOGRAM N/A 08/23/2013   Procedure: INTRAOPERATIVE TRANSESOPHAGEAL ECHOCARDIOGRAM;  Surgeon: Melrose Nakayama, MD;  Location: [place];  Service: Open Heart Surgery;  Laterality: N/A;  . LEFT HEART CATHETERIZATION WITH CORONARY ANGIOGRAM N/A 08/16/2013   Procedure: LEFT HEART CATHETERIZATION WITH CORONARY ANGIOGRAM;  Surgeon: Lorretta Harp, MD;  Location: [facility] CATH LAB;  Service: Cardiovascular;  Laterality: N/A;   severe 3V CAD and 60% subclavian sternosis  . LOWER EXTREMITY ANGIOGRAM N/A 08/16/2013   Procedure: LOWER EXTREMITY ANGIOGRAM;  Surgeon: Lorretta Harp, MD;  Location: [facility] CATH LAB;  Service: Cardiovascular;  Laterality: N/A;  . LOWER EXTREMITY ANGIOGRAPHY Bilateral 05/08/2017   Procedure: Lower Extremity Angiography;  Surgeon: Lorretta Harp, MD;  Location: [place] [facility];  Service: Cardiovascular;  Laterality: Bilateral;  . PERIPHERAL VASCULAR BALLOON ANGIOPLASTY Left 05/08/2017   Procedure: PERIPHERAL VASCULAR BALLOON ANGIOPLASTY;  Surgeon: Lorretta Harp, MD;  Location: [place] [facility];  Service: Cardiovascular;  Laterality: Left;  COMMON ILIAC  . RADIOACTIVE SEED IMPLANT N/A 04/11/2017   Procedure: RADIOACTIVE SEED IMPLANT/BRACHYTHERAPY IMPLANT;  Surgeon: Kathie Rhodes, MD;  Location: [facility];  Service: Urology;  Laterality: N/A;    67   seeds implanted  . SPACE OAR INSTILLATION N/A 04/11/2017   Procedure: SPACE OAR INSTILLATION;  Surgeon: Kathie Rhodes, MD;  Location: [facility];  Service: Urology;  Laterality: N/A;  . TRANSTHORACIC ECHOCARDIOGRAM  07/15/2013   ef 63-01%, grade 1 diastolic dysfunction/  trivial MR, mild to moderate  PR, mild TR   . UPPER ESOPHAGEAL ENDOSCOPIC ULTRASOUND (EUS) N/A 12/19/2017   Procedure: UPPER ESOPHAGEAL ENDOSCOPIC ULTRASOUND (EUS);  Surgeon: Carol Ada, MD;  Location: [facility];  Service: Endoscopy;  Laterality: N/A;    Allergies: Patient has no known allergies.  Medications: Prior to Admission medications   Medication Sig Start Date End Date Taking? Authorizing Provider  aspirin 81 MG chewable tablet Chew 1 tablet (81 mg total) by mouth daily. Patient taking differently: Chew 81 mg by mouth at bedtime.  08/17/13  Yes Brett Canales, PA-C  cilostazol (PLETAL) 50 MG tablet Take 1 tablet (50 mg total) by mouth 2 (two) times daily. 05/21/17  Yes Lorretta Harp, MD  clopidogrel (PLAVIX) 75 MG tablet Take 75 mg by mouth at bedtime.  07/21/13  Yes [provider]  dexlansoprazole (DEXILANT) 60 MG capsule Take 60 mg by mouth daily as needed (for reflux.).    Yes [provider]  doxycycline (VIBRAMYCIN) 100 MG capsule Take 100 mg by mouth daily as needed for itching. 12/24/17  Yes [provider]  HYDROcodone-acetaminophen (NORCO) 5-325 MG tablet Take 1 tablet by mouth  every 6 (six) hours as needed for moderate pain. 01/13/18  Yes Truitt Merle, MD  hydrocortisone 2.5 % ointment Apply 1 application topically 2 (two) times daily as needed (for skin irritation).  02/18/17  Yes [provider]  metFORMIN (GLUCOPHAGE) 500 MG tablet Take 500 mg by mouth at bedtime.  04/14/17  Yes [provider]  metoprolol succinate (TOPROL-XL) 25 MG 24 hr tablet Take 25 mg by mouth at bedtime.  08/31/13  Yes [provider]  Multiple Vitamins-Minerals (MULTIVITAMIN WITH MINERALS) tablet Take 1 tablet by mouth daily.   Yes [provider]  simvastatin (ZOCOR) 20 MG tablet Take 20 mg by mouth at bedtime.  08/31/13  Yes [provider]  tamsulosin (FLOMAX) 0.4 MG CAPS capsule Take 0.4 mg by mouth every evening. 12/10/17  Yes [provider]    traMADol (ULTRAM) 50 MG tablet Take 50 mg by mouth every 6 (six) hours as needed (pain).    Yes [provider]  lidocaine-prilocaine (EMLA) cream Apply 1 application topically as needed (local anesthesia).    [provider]  ondansetron (ZOFRAN) 8 MG tablet Take 1 tablet (8 mg total) by mouth 2 (two) times daily as needed for refractory nausea / vomiting. Start on day 3 after chemotherapy. 01/14/18   Truitt Merle, MD  prochlorperazine (COMPAZINE) 10 MG tablet Take 1 tablet (10 mg total) by mouth every 6 (six) hours as needed (NAUSEA). 01/14/18   Truitt Merle, MD     Family History  Problem Relation Age of Onset  . Diabetes Mother   . Heart disease Father     Social History   Socioeconomic History  . Marital status: Married    Spouse name: Not on file  . Number of children: 3  . Years of education: Not on file  . Highest education level: Not on file  Occupational History  . Not on file  Social Needs  . Financial resource strain: Not on file  . Food insecurity:    Worry: Not on file    Inability: Not on file  . Transportation needs:    Medical: Not on file    Non-medical: Not on file  Tobacco Use  . Smoking status: Former Smoker    Packs/day: 1.00    Years: 35.00    Pack years: 35.00    Types: Cigarettes    Last attempt to quit: 05/04/2013    Years since quitting: 4.7  . Smokeless tobacco: Never Used  Substance and Sexual Activity  . Alcohol use: Yes    Alcohol/week: 4.0 standard drinks    Types: 4 Standard drinks or equivalent per week    Comment: none in 2 months, previously 1-2 drinks per week   . Drug use: No  . Sexual activity: Not on file  Lifestyle  . Physical activity:    Days per week: Not on file    Minutes per session: Not on file  . Stress: Not on file  Relationships  . Social connections:    Talks on phone: Not on file    Gets together: Not on file    Attends religious service: Not on file    Active member of club or organization: Not on  file    Attends meetings of clubs or organizations: Not on file    Relationship status: Not on file  Other Topics Concern  . Not on file  Social History Narrative  . Not on file     Review of Systems: A 12  point ROS discussed and pertinent positives are indicated in the HPI above.  All other systems are negative.  Review of Systems  Constitutional: Positive for unexpected weight change. Negative for activity change and fever.  Respiratory: Negative for cough and shortness of breath.   Gastrointestinal: Positive for abdominal pain.  Musculoskeletal: Positive for back pain.  Neurological: Negative for weakness.  Psychiatric/Behavioral: Negative for behavioral problems and confusion.    Vital Signs: BP (!) 152/65   Pulse 70   Temp 97.9 F (36.6 C)   SpO2 100%   Physical Exam  Constitutional: He is oriented to person, place, and time.  Cardiovascular: Normal rate and regular rhythm.  Pulmonary/Chest: Effort normal and breath sounds normal.  Abdominal: Soft. Bowel sounds are normal. There is no tenderness.  Musculoskeletal: Normal range of motion.  Neurological: He is alert and oriented to person, place, and time.  Skin: Skin is warm and dry.  Psychiatric: He has a normal mood and affect. His behavior is normal. Judgment and thought content normal.  Vitals reviewed.   Imaging: Ct Chest W Contrast  Result Date: 01/06/2018 CLINICAL DATA:  Pancreatic cancer.  Staging. EXAM: CT CHEST WITH CONTRAST TECHNIQUE: Multidetector CT imaging of the chest was performed during intravenous contrast administration. CONTRAST:  61mL OMNIPAQUE IOHEXOL 300 MG/ML  SOLN COMPARISON:  Acute abdomen series 11/21/2017. No prior chest CT. Today's abdominal MRI is also reviewed. FINDINGS: Cardiovascular: Aortic and branch vessel atherosclerosis. Normal heart size, without pericardial effusion. Prior median sternotomy for CABG. No central pulmonary embolism, on this non-dedicated study. Mediastinum/Nodes: No  supraclavicular adenopathy. No mediastinal or hilar adenopathy. Lungs/Pleura: No pleural fluid.  Mild centrilobular emphysema. 3 mm subpleural left lower lobe pulmonary nodule on image 142/5, favored to represent a subpleural lymph node. A 5 mm left lower lobe pulmonary nodule on image 91/5. Upper Abdomen: Deferred to today's dedicated abdominal MRI. Pancreatic neck/body carcinoma with vascular involvement as detailed on that exam. Musculoskeletal: No acute osseous abnormality. IMPRESSION: 1. Two non-specific small left lower lobe pulmonary nodules. Otherwise, no evidence of metastatic disease within the chest. 2.  Aortic Atherosclerosis (ICD10-I70.0).  Emphysema (ICD10-J43.9). Electronically Signed   By: Abigail Miyamoto M.D.   On: 01/06/2018 08:30   Mr Abdomen W Wo Contrast  Result Date: 01/05/2018 CLINICAL DATA:  New diagnosis of pancreatic adenocarcinoma on 12/19/2017 EUS biopsy. EXAM: MRI ABDOMEN WITHOUT AND WITH CONTRAST TECHNIQUE: Multiplanar multisequence MR imaging of the abdomen was performed both before and after the administration of intravenous contrast. CONTRAST:  6 cc Gadavist IV. COMPARISON:  None available at the time of this dictation. FINDINGS: Lower chest: No acute abnormality at the lung bases. Sternotomy wires are noted in the lower chest. Hepatobiliary: Normal liver size and configuration. No hepatic steatosis. There are two small similar T2 hyperintense lesions measuring 0.9 cm in the segment 2 left liver dome and 0.7 cm in the segment 4B left liver lobe (series 4/image 19), difficult to characterize given small size and mild motion degradation on the postcontrast sequences, with apparent progressive nodular enhancement on the final postcontrast sequence, which may indicate hemangiomas. No additional liver lesions. Normal gallbladder with no cholelithiasis. No biliary ductal dilatation. Common bile duct diameter 4 mm. No choledocholithiasis. Pancreas: There is a poorly marginated  hypoenhancing 4.2 x 2.5 x 5.2 cm pancreatic body mass (series 1105/image 59), which demonstrates direct extension to the central mesentery. There is parenchymal atrophy and irregular pancreatic duct dilation up to 7 mm diameter in the pancreatic tail peripheral to this mass.  This mass nearly encases (approximately 75% circumferential involvement by tumor on series 1104/image 64)) the portosplenic venous confluence, which is significantly narrowed in caliber by mass. Splenic vein appears occluded by the mass. SMA is encased by tumor (100% circumferential involvement on series 1105/image 67). The pancreatic mass abuts the celiac trunk at the trifurcation and may encase the common hepatic artery and splenic artery proximally (series 1104/image 56). No pancreas divisum. Spleen: Normal size. No mass. Adrenals/Urinary Tract: Normal adrenals. No hydronephrosis. Subcentimeter simple left renal cysts. No suspicious renal masses. Stomach/Bowel: Normal non-distended stomach. Visualized small and large bowel is normal caliber, with no bowel wall thickening. Vascular/Lymphatic: Atherosclerotic nonaneurysmal abdominal aorta. Patent hepatic, right and left portal and renal veins. No pathologically enlarged lymph nodes in the abdomen. Other: No abdominal ascites or focal fluid collection. Musculoskeletal: No aggressive appearing focal osseous lesions. IMPRESSION: 1. Poorly marginated hypoenhancing 4.2 x 2.5 x 5.2 cm pancreatic body mass with pancreatic tail atrophy and duct dilation, compatible with known pancreatic adenocarcinoma. Pancreatic tumor appears locally advanced with significant vascular involvement as detailed. No biliary ductal dilatation. 2. No abdominal adenopathy. 3. Two similar subcentimeter enhancing left liver lobe masses as detailed, which are too small to accurately characterize on this mildly motion degraded scan. Differential includes small metastases or hemangiomas. Suggest follow-up MRI abdomen without and  with IV contrast in 3 months. These lesions are probably too small to characterize by PET-CT. 4.  Aortic Atherosclerosis (ICD10-I70.0). Electronically Signed   By: Ilona Sorrel M.D.   On: 01/05/2018 17:37   Mr 3d Recon At Scanner  Result Date: 01/05/2018 CLINICAL DATA:  New diagnosis of pancreatic adenocarcinoma on 12/19/2017 EUS biopsy. EXAM: MRI ABDOMEN WITHOUT AND WITH CONTRAST TECHNIQUE: Multiplanar multisequence MR imaging of the abdomen was performed both before and after the administration of intravenous contrast. CONTRAST:  6 cc Gadavist IV. COMPARISON:  None available at the time of this dictation. FINDINGS: Lower chest: No acute abnormality at the lung bases. Sternotomy wires are noted in the lower chest. Hepatobiliary: Normal liver size and configuration. No hepatic steatosis. There are two small similar T2 hyperintense lesions measuring 0.9 cm in the segment 2 left liver dome and 0.7 cm in the segment 4B left liver lobe (series 4/image 19), difficult to characterize given small size and mild motion degradation on the postcontrast sequences, with apparent progressive nodular enhancement on the final postcontrast sequence, which may indicate hemangiomas. No additional liver lesions. Normal gallbladder with no cholelithiasis. No biliary ductal dilatation. Common bile duct diameter 4 mm. No choledocholithiasis. Pancreas: There is a poorly marginated hypoenhancing 4.2 x 2.5 x 5.2 cm pancreatic body mass (series 1105/image 59), which demonstrates direct extension to the central mesentery. There is parenchymal atrophy and irregular pancreatic duct dilation up to 7 mm diameter in the pancreatic tail peripheral to this mass. This mass nearly encases (approximately 75% circumferential involvement by tumor on series 1104/image 64)) the portosplenic venous confluence, which is significantly narrowed in caliber by mass. Splenic vein appears occluded by the mass. SMA is encased by tumor (100% circumferential  involvement on series 1105/image 67). The pancreatic mass abuts the celiac trunk at the trifurcation and may encase the common hepatic artery and splenic artery proximally (series 1104/image 56). No pancreas divisum. Spleen: Normal size. No mass. Adrenals/Urinary Tract: Normal adrenals. No hydronephrosis. Subcentimeter simple left renal cysts. No suspicious renal masses. Stomach/Bowel: Normal non-distended stomach. Visualized small and large bowel is normal caliber, with no bowel wall thickening. Vascular/Lymphatic: Atherosclerotic nonaneurysmal abdominal aorta. Patent  hepatic, right and left portal and renal veins. No pathologically enlarged lymph nodes in the abdomen. Other: No abdominal ascites or focal fluid collection. Musculoskeletal: No aggressive appearing focal osseous lesions. IMPRESSION: 1. Poorly marginated hypoenhancing 4.2 x 2.5 x 5.2 cm pancreatic body mass with pancreatic tail atrophy and duct dilation, compatible with known pancreatic adenocarcinoma. Pancreatic tumor appears locally advanced with significant vascular involvement as detailed. No biliary ductal dilatation. 2. No abdominal adenopathy. 3. Two similar subcentimeter enhancing left liver lobe masses as detailed, which are too small to accurately characterize on this mildly motion degraded scan. Differential includes small metastases or hemangiomas. Suggest follow-up MRI abdomen without and with IV contrast in 3 months. These lesions are probably too small to characterize by PET-CT. 4.  Aortic Atherosclerosis (ICD10-I70.0). Electronically Signed   By: Ilona Sorrel M.D.   On: 01/05/2018 17:37    Labs:  CBC: Recent Labs    05/08/17 0905 05/09/17 0451 12/30/17 1459 01/13/18 1555  WBC 3.8* 4.8 3.8* 3.2*  HGB 12.6* 11.0* 12.6* 13.0  HCT 39.1 34.3* 38.4 40.8  PLT 212 179 225 198    COAGS: Recent Labs    04/04/17 1105 05/08/17 0905  INR 0.98 0.96  APTT 28  --     BMP: Recent Labs    05/08/17 0905 05/09/17 0451  12/30/17 1459 01/13/18 1555  NA 142 140 139 139  K 5.0 4.0 4.6 4.6  CL 108 106 100 99  CO2 25 26 28  33*  GLUCOSE 128* 139* 166* 163*  BUN 10 12 12 20   CALCIUM 8.8* 8.5* 10.3 10.2  CREATININE 0.83 0.96 0.92 0.95  GFRNONAA >60 >60 >60 >60  GFRAA >60 >60 >60 >60    LIVER FUNCTION TESTS: Recent Labs    04/04/17 1105 12/30/17 1459 01/13/18 1555  BILITOT 0.9 0.7 0.5  AST 21 19 13*  ALT 18 24 14   ALKPHOS 59 49 52  PROT 7.0 7.6 7.6  ALBUMIN 4.2 4.6 4.6    TUMOR MARKERS: No results for input(s): AFPTM, CEA, CA199, CHROMGRNA in the last 8760 hours.  Assessment and Plan:  Hx prostate cancer New pancreatic cancer To start chemo tomorrow Scheduled for Port a cath today Risks and benefits of image guided port-a-catheter placement was discussed with the patient including, but not limited to bleeding, infection, pneumothorax, or fibrin sheath development and need for additional procedures.  All of the patient's questions were answered, patient is agreeable to proceed. Consent signed and in chart.    Thank you for this interesting consult.  I greatly enjoyed meeting BOLESLAW BORGHI and look forward to participating in their care.  A copy of this report was sent to the requesting provider on this date.  Electronically Signed: Lavonia Drafts, PA-C 01/20/2018, 2:26 PM   I spent a total of  30 Minutes   in face to face in clinical consultation, greater than 50% of which was counseling/coordinating care for [facility]

## 2018-01-21 ENCOUNTER — Inpatient Hospital Stay: Payer: Medicare Other

## 2018-01-21 ENCOUNTER — Inpatient Hospital Stay: Payer: Medicare Other | Attending: Nurse Practitioner | Admitting: Nurse Practitioner

## 2018-01-21 ENCOUNTER — Encounter: Payer: Self-pay | Admitting: Nurse Practitioner

## 2018-01-21 VITALS — BP 119/88 | HR 78 | Temp 98.3°F | Resp 16

## 2018-01-21 DIAGNOSIS — R630 Anorexia: Secondary | ICD-10-CM

## 2018-01-21 DIAGNOSIS — I2510 Atherosclerotic heart disease of native coronary artery without angina pectoris: Secondary | ICD-10-CM

## 2018-01-21 DIAGNOSIS — Z87891 Personal history of nicotine dependence: Secondary | ICD-10-CM | POA: Diagnosis not present

## 2018-01-21 DIAGNOSIS — C251 Malignant neoplasm of body of pancreas: Secondary | ICD-10-CM | POA: Diagnosis not present

## 2018-01-21 DIAGNOSIS — E119 Type 2 diabetes mellitus without complications: Secondary | ICD-10-CM | POA: Diagnosis not present

## 2018-01-21 DIAGNOSIS — C61 Malignant neoplasm of prostate: Secondary | ICD-10-CM | POA: Diagnosis not present

## 2018-01-21 DIAGNOSIS — Z23 Encounter for immunization: Secondary | ICD-10-CM | POA: Insufficient documentation

## 2018-01-21 DIAGNOSIS — F419 Anxiety disorder, unspecified: Secondary | ICD-10-CM | POA: Insufficient documentation

## 2018-01-21 DIAGNOSIS — I10 Essential (primary) hypertension: Secondary | ICD-10-CM

## 2018-01-21 DIAGNOSIS — K649 Unspecified hemorrhoids: Secondary | ICD-10-CM | POA: Diagnosis not present

## 2018-01-21 DIAGNOSIS — I700 Atherosclerosis of aorta: Secondary | ICD-10-CM | POA: Insufficient documentation

## 2018-01-21 DIAGNOSIS — F329 Major depressive disorder, single episode, unspecified: Secondary | ICD-10-CM | POA: Diagnosis not present

## 2018-01-21 DIAGNOSIS — K5900 Constipation, unspecified: Secondary | ICD-10-CM | POA: Insufficient documentation

## 2018-01-21 DIAGNOSIS — Z5189 Encounter for other specified aftercare: Secondary | ICD-10-CM | POA: Diagnosis not present

## 2018-01-21 DIAGNOSIS — R109 Unspecified abdominal pain: Secondary | ICD-10-CM

## 2018-01-21 DIAGNOSIS — Z5111 Encounter for antineoplastic chemotherapy: Secondary | ICD-10-CM | POA: Diagnosis not present

## 2018-01-21 DIAGNOSIS — Z79899 Other long term (current) drug therapy: Secondary | ICD-10-CM | POA: Insufficient documentation

## 2018-01-21 LAB — CBC WITH DIFFERENTIAL (CANCER CENTER ONLY)
BASOS ABS: 0 10*3/uL (ref 0.0–0.1)
Basophils Relative: 0 %
EOS ABS: 0.1 10*3/uL (ref 0.0–0.5)
EOS PCT: 2 %
HCT: 39.2 % (ref 38.4–49.9)
Hemoglobin: 13.3 g/dL (ref 13.0–17.1)
Lymphocytes Relative: 23 %
Lymphs Abs: 1 10*3/uL (ref 0.9–3.3)
MCH: 30.4 pg (ref 27.2–33.4)
MCHC: 33.9 g/dL (ref 32.0–36.0)
MCV: 89.8 fL (ref 79.3–98.0)
Monocytes Absolute: 0.4 10*3/uL (ref 0.1–0.9)
Monocytes Relative: 9 %
Neutro Abs: 3 10*3/uL (ref 1.5–6.5)
Neutrophils Relative %: 66 %
PLATELETS: 229 10*3/uL (ref 140–400)
RBC: 4.36 MIL/uL (ref 4.20–5.82)
RDW: 12.9 % (ref 11.0–14.6)
WBC Count: 4.5 10*3/uL (ref 4.0–10.3)

## 2018-01-21 LAB — CMP (CANCER CENTER ONLY)
ALBUMIN: 4.6 g/dL (ref 3.5–5.0)
ALK PHOS: 42 U/L (ref 38–126)
ALT: 16 U/L (ref 0–44)
AST: 22 U/L (ref 15–41)
Anion gap: 11 (ref 5–15)
BUN: 9 mg/dL (ref 8–23)
CO2: 26 mmol/L (ref 22–32)
CREATININE: 0.82 mg/dL (ref 0.61–1.24)
Calcium: 9.9 mg/dL (ref 8.9–10.3)
Chloride: 101 mmol/L (ref 98–111)
GFR, Est AFR Am: >60 mL/min (ref >60)
Glucose, Bld: 170 mg/dL — ABNORMAL HIGH (ref 70–99)
Potassium: 4.7 mmol/L (ref 3.5–5.1)
SODIUM: 138 mmol/L (ref 135–145)
TOTAL PROTEIN: 7.4 g/dL (ref 6.5–8.1)
Total Bilirubin: 0.6 mg/dL (ref 0.3–1.2)

## 2018-01-21 MED ORDER — SODIUM CHLORIDE 0.9% FLUSH
10.0000 mL | INTRAVENOUS | Status: DC | PRN
  Filled 2018-01-21: qty 10

## 2018-01-21 MED ORDER — ATROPINE SULFATE 1 MG/ML IJ SOLN
INTRAMUSCULAR | Status: AC
  Filled 2018-01-21: qty 1

## 2018-01-21 MED ORDER — DEXTROSE 5 % IV SOLN
Freq: Once | INTRAVENOUS | Status: AC
  Administered 2018-01-21: 10:00:00 via INTRAVENOUS
  Filled 2018-01-21: qty 250

## 2018-01-21 MED ORDER — FLUOROURACIL CHEMO INJECTION 2.5 GM/50ML
400.0000 mg/m2 | Freq: Once | INTRAVENOUS | Status: AC
  Administered 2018-01-21: 700 mg via INTRAVENOUS
  Filled 2018-01-21: qty 14

## 2018-01-21 MED ORDER — PALONOSETRON HCL INJECTION 0.25 MG/5ML
INTRAVENOUS | Status: AC
  Filled 2018-01-21: qty 5

## 2018-01-21 MED ORDER — PALONOSETRON HCL INJECTION 0.25 MG/5ML
0.2500 mg | Freq: Once | INTRAVENOUS | Status: AC
  Administered 2018-01-21: 0.25 mg via INTRAVENOUS

## 2018-01-21 MED ORDER — OXALIPLATIN CHEMO INJECTION 100 MG/20ML
70.0000 mg/m2 | Freq: Once | INTRAVENOUS | Status: AC
  Administered 2018-01-21: 120 mg via INTRAVENOUS
  Filled 2018-01-21: qty 4

## 2018-01-21 MED ORDER — DEXTROSE 5 % IV SOLN
Freq: Once | INTRAVENOUS | Status: AC
  Administered 2018-01-21: 13:00:00 via INTRAVENOUS
  Filled 2018-01-21: qty 250

## 2018-01-21 MED ORDER — DEXAMETHASONE SODIUM PHOSPHATE 10 MG/ML IJ SOLN: 10.0000 mg | Freq: Once | INTRAMUSCULAR | Status: DC

## 2018-01-21 MED ORDER — SODIUM CHLORIDE 0.9 % IV SOLN
2400.0000 mg/m2 | INTRAVENOUS | Status: DC
  Administered 2018-01-21: 4050 mg via INTRAVENOUS
  Filled 2018-01-21: qty 81

## 2018-01-21 MED ORDER — HYDROCODONE-ACETAMINOPHEN 5-325 MG PO TABS
1.0000 | ORAL_TABLET | Freq: Once | ORAL | Status: AC
  Administered 2018-01-21: 1 via ORAL

## 2018-01-21 MED ORDER — HYDROCODONE-ACETAMINOPHEN 5-325 MG PO TABS
ORAL_TABLET | ORAL | Status: AC
  Filled 2018-01-21: qty 1

## 2018-01-21 MED ORDER — HYDROCODONE-ACETAMINOPHEN 5-325 MG PO TABS: 1.0000 | ORAL_TABLET | Freq: Four times a day (QID) | ORAL | 0 refills | Status: DC | PRN

## 2018-01-21 MED ORDER — HEPARIN SOD (PORK) LOCK FLUSH 100 UNIT/ML IV SOLN
500.0000 [IU] | Freq: Once | INTRAVENOUS | Status: DC | PRN
  Filled 2018-01-21: qty 5

## 2018-01-21 MED ORDER — SODIUM CHLORIDE 0.9 % IV SOLN
150.0000 mg/m2 | Freq: Once | INTRAVENOUS | Status: AC
  Administered 2018-01-21: 260 mg via INTRAVENOUS
  Filled 2018-01-21: qty 13

## 2018-01-21 MED ORDER — DEXAMETHASONE SODIUM PHOSPHATE 10 MG/ML IJ SOLN
10.0000 mg | Freq: Once | INTRAMUSCULAR | Status: AC
  Administered 2018-01-21: 10 mg via INTRAVENOUS

## 2018-01-21 MED ORDER — SODIUM CHLORIDE 0.9 % IV SOLN
400.0000 mg/m2 | Freq: Once | INTRAVENOUS | Status: AC
  Administered 2018-01-21: 676 mg via INTRAVENOUS
  Filled 2018-01-21: qty 33.8

## 2018-01-21 MED ORDER — ATROPINE SULFATE 1 MG/ML IJ SOLN
0.5000 mg | Freq: Once | INTRAMUSCULAR | Status: AC | PRN
  Administered 2018-01-21: 0.5 mg via INTRAVENOUS

## 2018-01-21 MED ORDER — MIRTAZAPINE 7.5 MG PO TABS: 7.5000 mg | ORAL_TABLET | Freq: Every day | ORAL | 1 refills | Status: DC

## 2018-01-21 MED ORDER — DEXAMETHASONE SODIUM PHOSPHATE 10 MG/ML IJ SOLN
INTRAMUSCULAR | Status: AC
  Filled 2018-01-21: qty 1

## 2018-01-21 NOTE — Patient Instructions (Signed)
[facility] Discharge Instructions for Patients Receiving Chemotherapy  Today you received the following chemotherapy agents Oxaliplatin, Leucovorin, Irinotecan, Adrucil push and pump.   To help prevent nausea and vomiting after your treatment, we encourage you to take your nausea medication as prescribed by your physician.   If you develop nausea and vomiting that is not controlled by your nausea medication, call the clinic.   BELOW ARE SYMPTOMS THAT SHOULD BE REPORTED IMMEDIATELY:  *FEVER GREATER THAN 100.5 F  *CHILLS WITH OR WITHOUT FEVER  NAUSEA AND VOMITING THAT IS NOT CONTROLLED WITH YOUR NAUSEA MEDICATION  *UNUSUAL SHORTNESS OF BREATH  *UNUSUAL BRUISING OR BLEEDING  TENDERNESS IN MOUTH AND THROAT WITH OR WITHOUT PRESENCE OF ULCERS  *URINARY PROBLEMS  *BOWEL PROBLEMS  UNUSUAL RASH Items with * indicate a potential emergency and should be followed up as soon as possible.  Feel free to call the clinic should you have any questions or concerns. The clinic phone number is [phone].  Please show the [facility] at check-in to the Emergency Department and triage nurse.  Oxaliplatin Injection What is this medicine? OXALIPLATIN (ox AL i PLA tin) is a chemotherapy drug. It targets fast dividing cells, like cancer cells, and causes these cells to die. This medicine is used to treat cancers of the colon and rectum, and many other cancers. This medicine may be used for other purposes; ask your health care provider or pharmacist if you have questions. COMMON BRAND NAME(S): Eloxatin What should I tell my health care provider before I take this medicine? They need to know if you have any of these conditions: -kidney disease -an unusual or allergic reaction to oxaliplatin, other chemotherapy, other medicines, foods, dyes, or preservatives -pregnant or trying to get pregnant -breast-feeding How should I use this medicine? This drug is given as an infusion  into a vein. It is administered in a hospital or clinic by a specially trained health care professional. Talk to your pediatrician regarding the use of this medicine in children. Special care may be needed. Overdosage: If you think you have taken too much of this medicine contact a poison control center or emergency room at once. NOTE: This medicine is only for you. Do not share this medicine with others. What if I miss a dose? It is important not to miss a dose. Call your doctor or health care professional if you are unable to keep an appointment. What may interact with this medicine? -medicines to increase blood counts like filgrastim, pegfilgrastim, sargramostim -probenecid -some antibiotics like amikacin, gentamicin, neomycin, polymyxin B, streptomycin, tobramycin -zalcitabine Talk to your doctor or health care professional before taking any of these medicines: -acetaminophen -aspirin -ibuprofen -ketoprofen -naproxen This list may not describe all possible interactions. Give your health care provider a list of all the medicines, herbs, non-prescription drugs, or dietary supplements you use. Also tell them if you smoke, drink alcohol, or use illegal drugs. Some items may interact with your medicine. What should I watch for while using this medicine? Your condition will be monitored carefully while you are receiving this medicine. You will need important blood work done while you are taking this medicine. This medicine can make you more sensitive to cold. Do not drink cold drinks or use ice. Cover exposed skin before coming in contact with cold temperatures or cold objects. When out in cold weather wear warm clothing and cover your mouth and nose to warm the air that goes into your lungs. Tell your doctor  if you get sensitive to the cold. This drug may make you feel generally unwell. This is not uncommon, as chemotherapy can affect healthy cells as well as cancer cells. Report any side  effects. Continue your course of treatment even though you feel ill unless your doctor tells you to stop. In some cases, you may be given additional medicines to help with side effects. Follow all directions for their use. Call your doctor or health care professional for advice if you get a fever, chills or sore throat, or other symptoms of a cold or flu. Do not treat yourself. This drug decreases your body's ability to fight infections. Try to avoid being around people who are sick. This medicine may increase your risk to bruise or bleed. Call your doctor or health care professional if you notice any unusual bleeding. Be careful brushing and flossing your teeth or using a toothpick because you may get an infection or bleed more easily. If you have any dental work done, tell your dentist you are receiving this medicine. Avoid taking products that contain aspirin, acetaminophen, ibuprofen, naproxen, or ketoprofen unless instructed by your doctor. These medicines may hide a fever. Do not become pregnant while taking this medicine. Women should inform their doctor if they wish to become pregnant or think they might be pregnant. There is a potential for serious side effects to an unborn child. Talk to your health care professional or pharmacist for more information. Do not breast-feed an infant while taking this medicine. Call your doctor or health care professional if you get diarrhea. Do not treat yourself. What side effects may I notice from receiving this medicine? Side effects that you should report to your doctor or health care professional as soon as possible: -allergic reactions like skin rash, itching or hives, swelling of the face, lips, or tongue -low blood counts - This drug may decrease the number of white blood cells, red blood cells and platelets. You may be at increased risk for infections and bleeding. -signs of infection - fever or chills, cough, sore throat, pain or difficulty passing  urine -signs of decreased platelets or bleeding - bruising, pinpoint red spots on the skin, black, tarry stools, nosebleeds -signs of decreased red blood cells - unusually weak or tired, fainting spells, lightheadedness -breathing problems -chest pain, pressure -cough -diarrhea -jaw tightness -mouth sores -nausea and vomiting -pain, swelling, redness or irritation at the injection site -pain, tingling, numbness in the hands or feet -problems with balance, talking, walking -redness, blistering, peeling or loosening of the skin, including inside the mouth -trouble passing urine or change in the amount of urine Side effects that usually do not require medical attention (report to your doctor or health care professional if they continue or are bothersome): -changes in vision -constipation -hair loss -loss of appetite -metallic taste in the mouth or changes in taste -stomach pain This list may not describe all possible side effects. Call your doctor for medical advice about side effects. You may report side effects to FDA at 1-800-FDA-1088. Where should I keep my medicine? This drug is given in a hospital or clinic and will not be stored at home. NOTE: This sheet is a summary. It may not cover all possible information. If you have questions about this medicine, talk to your doctor, pharmacist, or health care provider.  2018 Elsevier/Gold Standard (2007-11-03 17:22:47)  Leucovorin injection What is this medicine? LEUCOVORIN (loo koe VOR in) is used to prevent or treat the harmful effects of some  medicines. This medicine is used to treat anemia caused by a low amount of folic acid in the body. It is also used with 5-fluorouracil (5-FU) to treat colon cancer. This medicine may be used for other purposes; ask your health care provider or pharmacist if you have questions. What should I tell my health care provider before I take this medicine? They need to know if you have any of these  conditions: -anemia from low levels of vitamin B-12 in the blood -an unusual or allergic reaction to leucovorin, folic acid, other medicines, foods, dyes, or preservatives -pregnant or trying to get pregnant -breast-feeding How should I use this medicine? This medicine is for injection into a muscle or into a vein. It is given by a health care professional in a hospital or clinic setting. Talk to your pediatrician regarding the use of this medicine in children. Special care may be needed. Overdosage: If you think you have taken too much of this medicine contact a poison control center or emergency room at once. NOTE: This medicine is only for you. Do not share this medicine with others. What if I miss a dose? This does not apply. What may interact with this medicine? -capecitabine -fluorouracil -phenobarbital -phenytoin -primidone -trimethoprim-sulfamethoxazole This list may not describe all possible interactions. Give your health care provider a list of all the medicines, herbs, non-prescription drugs, or dietary supplements you use. Also tell them if you smoke, drink alcohol, or use illegal drugs. Some items may interact with your medicine. What should I watch for while using this medicine? Your condition will be monitored carefully while you are receiving this medicine. This medicine may increase the side effects of 5-fluorouracil, 5-FU. Tell your doctor or health care professional if you have diarrhea or mouth sores that do not get better or that get worse. What side effects may I notice from receiving this medicine? Side effects that you should report to your doctor or health care professional as soon as possible: -allergic reactions like skin rash, itching or hives, swelling of the face, lips, or tongue -breathing problems -fever, infection -mouth sores -unusual bleeding or bruising -unusually weak or tired Side effects that usually do not require medical attention (report to  your doctor or health care professional if they continue or are bothersome): -constipation or diarrhea -loss of appetite -nausea, vomiting This list may not describe all possible side effects. Call your doctor for medical advice about side effects. You may report side effects to FDA at 1-800-FDA-1088. Where should I keep my medicine? This drug is given in a hospital or clinic and will not be stored at home. NOTE: This sheet is a summary. It may not cover all possible information. If you have questions about this medicine, talk to your doctor, pharmacist, or health care provider.  2018 Elsevier/Gold Standard (2007-10-13 16:50:29)  Irinotecan injection What is this medicine? IRINOTECAN (ir in oh TEE kan ) is a chemotherapy drug. It is used to treat colon and rectal cancer. This medicine may be used for other purposes; ask your health care provider or pharmacist if you have questions. COMMON BRAND NAME(S): Camptosar What should I tell my health care provider before I take this medicine? They need to know if you have any of these conditions: -blood disorders -dehydration -diarrhea -infection (especially a virus infection such as chickenpox, cold sores, or herpes) -liver disease -low blood counts, like low white cell, platelet, or red cell counts -recent or ongoing radiation therapy -an unusual or allergic reaction to  irinotecan, sorbitol, other chemotherapy, other medicines, foods, dyes, or preservatives -pregnant or trying to get pregnant -breast-feeding How should I use this medicine? This drug is given as an infusion into a vein. It is administered in a hospital or clinic by a specially trained health care professional. Talk to your pediatrician regarding the use of this medicine in children. Special care may be needed. Overdosage: If you think you have taken too much of this medicine contact a poison control center or emergency room at once. NOTE: This medicine is only for you. Do not  share this medicine with others. What if I miss a dose? It is important not to miss your dose. Call your doctor or health care professional if you are unable to keep an appointment. What may interact with this medicine? Do not take this medicine with any of the following medications: -atazanavir -certain medicines for fungal infections like itraconazole and ketoconazole -St. John's Wort This medicine may also interact with the following medications: -dexamethasone -diuretics -laxatives -medicines for seizures like carbamazepine, mephobarbital, phenobarbital, phenytoin, primidone -medicines to increase blood counts like filgrastim, pegfilgrastim, sargramostim -prochlorperazine -vaccines This list may not describe all possible interactions. Give your health care provider a list of all the medicines, herbs, non-prescription drugs, or dietary supplements you use. Also tell them if you smoke, drink alcohol, or use illegal drugs. Some items may interact with your medicine. What should I watch for while using this medicine? Your condition will be monitored carefully while you are receiving this medicine. You will need important blood work done while you are taking this medicine. This drug may make you feel generally unwell. This is not uncommon, as chemotherapy can affect healthy cells as well as cancer cells. Report any side effects. Continue your course of treatment even though you feel ill unless your doctor tells you to stop. In some cases, you may be given additional medicines to help with side effects. Follow all directions for their use. You may get drowsy or dizzy. Do not drive, use machinery, or do anything that needs mental alertness until you know how this medicine affects you. Do not stand or sit up quickly, especially if you are an older patient. This reduces the risk of dizzy or fainting spells. Call your doctor or health care professional for advice if you get a fever, chills or sore  throat, or other symptoms of a cold or flu. Do not treat yourself. This drug decreases your body's ability to fight infections. Try to avoid being around people who are sick. This medicine may increase your risk to bruise or bleed. Call your doctor or health care professional if you notice any unusual bleeding. Be careful brushing and flossing your teeth or using a toothpick because you may get an infection or bleed more easily. If you have any dental work done, tell your dentist you are receiving this medicine. Avoid taking products that contain aspirin, acetaminophen, ibuprofen, naproxen, or ketoprofen unless instructed by your doctor. These medicines may hide a fever. Do not become pregnant while taking this medicine. Women should inform their doctor if they wish to become pregnant or think they might be pregnant. There is a potential for serious side effects to an unborn child. Talk to your health care professional or pharmacist for more information. Do not breast-feed an infant while taking this medicine. What side effects may I notice from receiving this medicine? Side effects that you should report to your doctor or health care professional as soon as possible: -  allergic reactions like skin rash, itching or hives, swelling of the face, lips, or tongue -low blood counts - this medicine may decrease the number of white blood cells, red blood cells and platelets. You may be at increased risk for infections and bleeding. -signs of infection - fever or chills, cough, sore throat, pain or difficulty passing urine -signs of decreased platelets or bleeding - bruising, pinpoint red spots on the skin, black, tarry stools, blood in the urine -signs of decreased red blood cells - unusually weak or tired, fainting spells, lightheadedness -breathing problems -chest pain -diarrhea -feeling faint or lightheaded, falls -flushing, runny nose, sweating during infusion -mouth sores or pain -pain, swelling,  redness or irritation where injected -pain, swelling, warmth in the leg -pain, tingling, numbness in the hands or feet -problems with balance, talking, walking -stomach cramps, pain -trouble passing urine or change in the amount of urine -vomiting as to be unable to hold down drinks or food -yellowing of the eyes or skin Side effects that usually do not require medical attention (report to your doctor or health care professional if they continue or are bothersome): -constipation -hair loss -headache -loss of appetite -nausea, vomiting -stomach upset This list may not describe all possible side effects. Call your doctor for medical advice about side effects. You may report side effects to FDA at 1-800-FDA-1088. Where should I keep my medicine? This drug is given in a hospital or clinic and will not be stored at home. NOTE: This sheet is a summary. It may not cover all possible information. If you have questions about this medicine, talk to your doctor, pharmacist, or health care provider.  2018 Else[employer]/Gold Standard (2012-10-05 16:29:32)  Fluorouracil, 5-FU injection What is this medicine? FLUOROURACIL, 5-FU (flure oh YOOR a sil) is a chemotherapy drug. It slows the growth of cancer cells. This medicine is used to treat many types of cancer like breast cancer, colon or rectal cancer, pancreatic cancer, and stomach cancer. This medicine may be used for other purposes; ask your health care provider or pharmacist if you have questions. COMMON BRAND NAME(S): Adrucil What should I tell my health care provider before I take this medicine? They need to know if you have any of these conditions: -blood disorders -dihydropyrimidine dehydrogenase (DPD) deficiency -infection (especially a virus infection such as chickenpox, cold sores, or herpes) -kidney disease -liver disease -malnourished, poor nutrition -recent or ongoing radiation therapy -an unusual or allergic reaction to fluorouracil,  other chemotherapy, other medicines, foods, dyes, or preservatives -pregnant or trying to get pregnant -breast-feeding How should I use this medicine? This drug is given as an infusion or injection into a vein. It is administered in a hospital or clinic by a specially trained health care professional. Talk to your pediatrician regarding the use of this medicine in children. Special care may be needed. Overdosage: If you think you have taken too much of this medicine contact a poison control center or emergency room at once. NOTE: This medicine is only for you. Do not share this medicine with others. What if I miss a dose? It is important not to miss your dose. Call your doctor or health care professional if you are unable to keep an appointment. What may interact with this medicine? -allopurinol -cimetidine -dapsone -digoxin -hydroxyurea -leucovorin -levamisole -medicines for seizures like ethotoin, fosphenytoin, phenytoin -medicines to increase blood counts like filgrastim, pegfilgrastim, sargramostim -medicines that treat or prevent blood clots like warfarin, enoxaparin, and dalteparin -methotrexate -metronidazole -pyrimethamine -some other chemotherapy drugs  like busulfan, cisplatin, estramustine, vinblastine -trimethoprim -trimetrexate -vaccines Talk to your doctor or health care professional before taking any of these medicines: -acetaminophen -aspirin -ibuprofen -ketoprofen -naproxen This list may not describe all possible interactions. Give your health care provider a list of all the medicines, herbs, non-prescription drugs, or dietary supplements you use. Also tell them if you smoke, drink alcohol, or use illegal drugs. Some items may interact with your medicine. What should I watch for while using this medicine? Visit your doctor for checks on your progress. This drug may make you feel generally unwell. This is not uncommon, as chemotherapy can affect healthy cells as  well as cancer cells. Report any side effects. Continue your course of treatment even though you feel ill unless your doctor tells you to stop. In some cases, you may be given additional medicines to help with side effects. Follow all directions for their use. Call your doctor or health care professional for advice if you get a fever, chills or sore throat, or other symptoms of a cold or flu. Do not treat yourself. This drug decreases your body's ability to fight infections. Try to avoid being around people who are sick. This medicine may increase your risk to bruise or bleed. Call your doctor or health care professional if you notice any unusual bleeding. Be careful brushing and flossing your teeth or using a toothpick because you may get an infection or bleed more easily. If you have any dental work done, tell your dentist you are receiving this medicine. Avoid taking products that contain aspirin, acetaminophen, ibuprofen, naproxen, or ketoprofen unless instructed by your doctor. These medicines may hide a fever. Do not become pregnant while taking this medicine. Women should inform their doctor if they wish to become pregnant or think they might be pregnant. There is a potential for serious side effects to an unborn child. Talk to your health care professional or pharmacist for more information. Do not breast-feed an infant while taking this medicine. Men should inform their doctor if they wish to father a child. This medicine may lower sperm counts. Do not treat diarrhea with over the counter products. Contact your doctor if you have diarrhea that lasts more than 2 days or if it is severe and watery. This medicine can make you more sensitive to the sun. Keep out of the sun. If you cannot avoid being in the sun, wear protective clothing and use sunscreen. Do not use sun lamps or tanning beds/booths. What side effects may I notice from receiving this medicine? Side effects that you should report to  your doctor or health care professional as soon as possible: -allergic reactions like skin rash, itching or hives, swelling of the face, lips, or tongue -low blood counts - this medicine may decrease the number of white blood cells, red blood cells and platelets. You may be at increased risk for infections and bleeding. -signs of infection - fever or chills, cough, sore throat, pain or difficulty passing urine -signs of decreased platelets or bleeding - bruising, pinpoint red spots on the skin, black, tarry stools, blood in the urine -signs of decreased red blood cells - unusually weak or tired, fainting spells, lightheadedness -breathing problems -changes in vision -chest pain -mouth sores -nausea and vomiting -pain, swelling, redness at site where injected -pain, tingling, numbness in the hands or feet -redness, swelling, or sores on hands or feet -stomach pain -unusual bleeding Side effects that usually do not require medical attention (report to your doctor or  health care professional if they continue or are bothersome): -changes in finger or toe nails -diarrhea -dry or itchy skin -hair loss -headache -loss of appetite -sensitivity of eyes to the light -stomach upset -unusually teary eyes This list may not describe all possible side effects. Call your doctor for medical advice about side effects. You may report side effects to FDA at 1-800-FDA-1088. Where should I keep my medicine? This drug is given in a hospital or clinic and will not be stored at home. NOTE: This sheet is a summary. It may not cover all possible information. If you have questions about this medicine, talk to your doctor, pharmacist, or health care provider.  2018 [employer]/Gold Standard (2007-08-12 13:53:16)

## 2018-01-22 ENCOUNTER — Inpatient Hospital Stay: Payer: Medicare Other

## 2018-01-22 ENCOUNTER — Encounter: Payer: Self-pay | Admitting: Licensed Clinical Social Worker

## 2018-01-22 ENCOUNTER — Inpatient Hospital Stay (HOSPITAL_BASED_OUTPATIENT_CLINIC_OR_DEPARTMENT_OTHER): Payer: Medicare Other | Admitting: Licensed Clinical Social Worker

## 2018-01-22 ENCOUNTER — Telehealth: Payer: Self-pay | Admitting: Hematology

## 2018-01-22 DIAGNOSIS — C251 Malignant neoplasm of body of pancreas: Secondary | ICD-10-CM

## 2018-01-22 DIAGNOSIS — Z7183 Encounter for nonprocreative genetic counseling: Secondary | ICD-10-CM

## 2018-01-22 DIAGNOSIS — C61 Malignant neoplasm of prostate: Secondary | ICD-10-CM

## 2018-01-22 DIAGNOSIS — Z803 Family history of malignant neoplasm of breast: Secondary | ICD-10-CM | POA: Diagnosis not present

## 2018-01-22 DIAGNOSIS — Z808 Family history of malignant neoplasm of other organs or systems: Secondary | ICD-10-CM | POA: Insufficient documentation

## 2018-01-22 NOTE — Telephone Encounter (Signed)
[facility] scheduled calendar printed per 10/2 los

## 2018-01-22 NOTE — Progress Notes (Signed)
REFERRING PROVIDER: Alla Feeling, NP [place], [place] [address]  PRIMARY PROVIDER:  Nolene Ebbs, MD  PRIMARY REASON FOR VISIT:  1. Malignant neoplasm of body of pancreas ([place])   2. Adenocarcinoma of prostate ([place])   3. Family history of breast cancer   4. Family history of brain cancer      HISTORY OF PRESENT ILLNESS:   Mr. Michael Maddox, a 67 y.o. male, was seen for a [place] cancer genetics consultation at the request of Cira Rue, NP due to a personal and family history of cancer.  Mr. Michon presents to clinic today to discuss the possibility of a hereditary predisposition to cancer, genetic testing, and to further clarify his future cancer risks, as well as potential cancer risks for family members.   In 2018, at the age of 22, Mr. Cohen was diagnosed with prostate cancer, Gleason 7. This was treated with seed implant.    In 2019, at the age of 36, Mr. Puertas was diagnosed with pancreatic cancer. This is being treated with chemotherapy.   Mr. Mancillas reports a normal colonoscopy 10 years ago and he does not smoke or drink.   CANCER HISTORY:  Oncology History   Cancer Staging Malignant neoplasm of body of pancreas [facility]) Staging form: Exocrine Pancreas, AJCC 8th Edition - Clinical stage from 12/19/2017: Stage IIA (cT3, cN0, cM0) - Signed by Truitt Merle, MD on 01/13/2018       Malignant neoplasm of body of pancreas ([place])   12/11/2017 Imaging    CT AP WITH CONTRAST IMPRESSION: Focal area of abnormal low attenuation in the pancreatic body measuring 3.7 x 2.9 cm with cut off of the duct most concerning for primary pancreatic neoplasm.  2 small low-attenuation foci identified within the liver which are nonspecific however could represent metastatic disease; one is 1.0 cm complex lesion within the hepatic dome, the other area is a low-attenuation focus adjacent to the falciform ligament likely representing a perfusion anomaly or possible local fatty  infiltration  Extensive atherosclerotic changes in the abdominal aorta, iliac vessels, splenic vessels.  Occlusion of the right and possibly left iliac.  Suspected chronic re-constitution of flow identified within the femoral vessels bilaterally.  Prostate brachytherapy consistent with history of malignancy  No evidence of small bowel obstruction.  Findings suggestive of constipation     12/19/2017 Procedure    EUS biopsy: In the body of the pancreas there was a large 20 x 30 mm, approximately, hypoechoic mass. It had irregular margin, but it was round/oval in appearance. There was pancreatic tail atrophy, but the PD was dilated up to 7.6 mm. The celiac axis was clearly viewed and there was no evidence of invasion. In the head of the pancreas a 1 cm lymph node was identified and the pancreas was lobular in the head and uncinate process. The left adrenal was clearly identified and it was enlarged, but there was no evidence of any masses.    12/19/2017 Initial Biopsy    Diagnosis FINE NEEDLE ASPIRATION,ENDOSCOPIC, PANCREAS BODY (SPECIMEN 1 OF 1 COLLECTED 12/19/17): MALIGNANT CELLS CONSISTENT WITH ADENOCARCINOMA.    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 Poorly marginated hypoenhancing 4.2 x 2.5  x 5.2 cm pancreatic body mass with pancreatic tail atrophy and duct dilation, compatible with known pancreatic adenocarcinoma. Pancreatic tumor appears locally advanced with significant vascular involvement as detailed. No biliary ductal dilatation. 2. No abdominal adenopathy. 3. Two similar subcentimeter enhancing left liver lobe masses as detailed, which are too small to accurately characterize on this mildly motion  degraded scan. Differential includes small metastases or hemangiomas. Suggest follow-up MRI abdomen without and with IV contrast in 3 months. These lesions are probably too small to characterize by PET-CT. 4.  Aortic Atherosclerosis (ICD10-I70.0).    01/06/2018 Imaging    01/06/2018 CT Chest IMPRESSION: 1. Two non-specific small left lower lobe pulmonary nodules. Otherwise, no evidence of metastatic disease within the chest. 2.  Aortic Atherosclerosis (ICD10-I70.0).  Emphysema (ICD10-J43.9).    01/13/2018 Tumor Marker    Tumor Marker Results for RAJEEV, ESCUE (MRN [id number]) as of 01/13/2018 09:17  Ref. Range 12/30/2017 14:59  CA 19-9 Latest Ref Range: 0 - 35 U/mL 1,263 (H)      01/20/2018 -  Chemotherapy    The patient had palonosetron (ALOXI) injection 0.25 mg, 0.25 mg, Intravenous,  Once, 1 of 4 cycles Administration: 0.25 mg (01/21/2018) pegfilgrastim-cbqv (UDENYCA) injection 6 mg, 6 mg, Subcutaneous, Once, 1 of 4 cycles irinotecan (CAMPTOSAR) 260 mg in sodium chloride 0.9 % 500 mL chemo infusion, 150 mg/m2 = 260 mg (100 % of original dose 150 mg/m2), Intravenous,  Once, 1 of 4 cycles Dose modification: 150 mg/m2 (original dose 150 mg/m2, Cycle 1, Reason: Provider Judgment) Administration: 260 mg (01/21/2018) leucovorin 676 mg in sodium chloride 0.9 % 250 mL infusion, 400 mg/m2 = 676 mg, Intravenous,  Once, 1 of 4 cycles Administration: 676 mg (01/21/2018) oxaliplatin (ELOXATIN) 120 mg in dextrose 5 % 500 mL chemo infusion, 70 mg/m2 = 120 mg (100 % of original dose 70 mg/m2), Intravenous,  Once, 1 of 4 cycles Dose modification: 70 mg/m2 (original dose 70 mg/m2, Cycle 1, Reason: Provider Judgment, Comment: first cycle only ) Administration: 120 mg (01/21/2018) fluorouracil (ADRUCIL) chemo injection 700 mg, 400 mg/m2 = 700 mg, Intravenous,  Once, 1 of 4 cycles Administration: 700 mg (01/21/2018) fluorouracil (ADRUCIL) 4,050 mg in sodium chloride 0.9 % 69 mL chemo infusion, 2,400 mg/m2 =  4,050 mg, Intravenous, 1 Day/Dose, 1 of 4 cycles Administration: 4,050 mg (01/21/2018)  for chemotherapy treatment.       Past Medical History:  Diagnosis Date  . Arthritis   . Carotid artery disease ([place]) followed by dr berry--- per last duplex 89-21-1941  LICA 74-08%, bilateral ECA >50%,  patent RICA post intervention   hx acute high grade stenosis RICA >80% 02-22-2009 in setting of  stroke  s/p  right carotid endarterectomy 02-28-2009/    . Claudication of both lower extremities ([place])    due to PAD ----  right > left  . COPD (chronic obstructive pulmonary disease) ([place])   . Coronary artery disease CARDIOLOGIST-  DR BERRY   hx positive myoview 07-29-2013;  08-16-2013 per cardiac cath, severe 3V CAD w/ 60% subclavian stenosis;  08-23-2013   s/p  CABG x3  (LIMA to LAD, SVG to OM1, SVG to PDA)  . CVA (cerebral vascular accident) ([place]) 02/22/2009   acute infarct right hemisphere w/ severe RICA stentosis----  per pt no residuals  . Family history of brain cancer   . Family history of breast cancer   . GERD (gastroesophageal reflux disease)   . HTN (hypertension)   . Hyperlipidemia   . Hyperplasia of  prostate with lower urinary tract symptoms (LUTS)   . Peripheral arterial disease ([place]) followed by dr berry--- last dobbler ABIs bilateral SFA occlusions (pt is scheduled for intervention 05-08-2017   hx PTA and stenting to bilateral SFA 2002 and PTA w/ stenting left common iliac artery and for in-stent restenosis 2003 Left SFA and 2005  stenting right SFA for in-stent restenosis  . Prostate cancer [facility]) urologist-  dr ottelin/  oncologist-  dr Tammi Klippel   dx 11-20-2016-- Stage T1c, Gleason 4+3, PSA 5.73, vol 35.95cc--- scheduled for radioactive seed implants 04-11-2017  . 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 S/P peripheral artery angioplasty with stent placement    bilateral SFA 2002;  in-stenosis bilateral SFA  (right 2003, left 2005) and left CIA 2003  .  Shortness of breath   . Type II diabetes mellitus ([place])   . Weak urinary stream   . Wears glasses     Past Surgical History:  Procedure Laterality Date  . ABDOMINAL AORTOGRAM N/A 05/08/2017   Procedure: ABDOMINAL AORTOGRAM;  Surgeon: Lorretta Harp, MD;  Location: [place] [facility];  Service: Cardiovascular;  Laterality: N/A;  . CARDIAC CATHETERIZATION  12-01-2000    dr berry    non-critial CAD & nonischemic cardiolite-- mLAD 50%, mLCx 70%, prox. to mid RCA 75% focal midsegment 80-85%  . CARDIOVASCULAR STRESS TEST  07-29-2013    dr berry   High risk nuclear study w/ inferoseptal ischemia/  normal LV function and wall motion,  nuclear study ef 52%  . CAROTID ENDARTERECTOMY Right 02-28-2009    dr c. Scot Dock  [facility]   patch angioplasty  . COLONOSCOPY    . CORONARY ARTERY BYPASS GRAFT N/A 08/23/2013   Procedure: CORONARY ARTERY BYPASS GRAFTING (CABG);  Surgeon: Melrose Nakayama, MD;  Location: [place];  Service: Open Heart Surgery;  Laterality: N/A;  Coronary Artery Bypass graft times three using left internal mammary artery and left leg saphenous vein   . CYSTOSCOPY N/A 04/11/2017   Procedure: CYSTOSCOPY FLEXIBLE;  Surgeon: Kathie Rhodes, MD;  Location: [facility];  Service: Urology;  Laterality: N/A;  no seeds found in bladder  . ENDOVASCULAR STENT INSERTION Bilateral right 12-25-2000 ;  left 03-02-2001   dr berry   PTA and stenting to bilateral SFA  . ENDOVASCULAR STENT INSERTION Bilateral right 08-30-2003 ;  left 11-10-2001    dr berry   PTA and stenting for in-stent restenosis right SFA 08-30-2003:  PTA and stenting distal left common iliac artery and in-stent restenosis left SFA 11-10-2001  . FINE NEEDLE ASPIRATION N/A 12/19/2017   Procedure: FINE NEEDLE ASPIRATION (FNA) LINEAR;  Surgeon: Carol Ada, MD;  Location: [facility];  Service: Endoscopy;  Laterality: N/A;  . INTRAOPERATIVE TRANSESOPHAGEAL ECHOCARDIOGRAM N/A 08/23/2013   Procedure: INTRAOPERATIVE  TRANSESOPHAGEAL ECHOCARDIOGRAM;  Surgeon: Melrose Nakayama, MD;  Location: [place];  Service: Open Heart Surgery;  Laterality: N/A;  . IR IMAGING GUIDED PORT INSERTION  01/20/2018  . LEFT HEART CATHETERIZATION WITH CORONARY ANGIOGRAM N/A 08/16/2013   Procedure: LEFT HEART CATHETERIZATION WITH CORONARY ANGIOGRAM;  Surgeon: Lorretta Harp, MD;  Location: [facility] CATH LAB;  Service: Cardiovascular;  Laterality: N/A;   severe 3V CAD and 60% subclavian sternosis  . LOWER EXTREMITY ANGIOGRAM N/A 08/16/2013   Procedure: LOWER EXTREMITY ANGIOGRAM;  Surgeon: Lorretta Harp, MD;  Location: [facility] CATH LAB;  Service: Cardiovascular;  Laterality: N/A;  . LOWER EXTREMITY ANGIOGRAPHY Bilateral 05/08/2017  Procedure: Lower Extremity Angiography;  Surgeon: Lorretta Harp, MD;  Location: [place] [facility];  Service: Cardiovascular;  Laterality: Bilateral;  . PERIPHERAL VASCULAR BALLOON ANGIOPLASTY Left 05/08/2017   Procedure: PERIPHERAL VASCULAR BALLOON ANGIOPLASTY;  Surgeon: Lorretta Harp, MD;  Location: [place] [facility];  Service: Cardiovascular;  Laterality: Left;  COMMON ILIAC  . RADIOACTIVE SEED IMPLANT N/A 04/11/2017   Procedure: RADIOACTIVE SEED IMPLANT/BRACHYTHERAPY IMPLANT;  Surgeon: Kathie Rhodes, MD;  Location: [facility];  Service: Urology;  Laterality: N/A;    67   seeds implanted  . SPACE OAR INSTILLATION N/A 04/11/2017   Procedure: SPACE OAR INSTILLATION;  Surgeon: Kathie Rhodes, MD;  Location: [facility];  Service: Urology;  Laterality: N/A;  . TRANSTHORACIC ECHOCARDIOGRAM  07/15/2013   ef 76-73%, grade 1 diastolic dysfunction/  trivial MR, mild to moderate PR, mild TR   . UPPER ESOPHAGEAL ENDOSCOPIC ULTRASOUND (EUS) N/A 12/19/2017   Procedure: UPPER ESOPHAGEAL ENDOSCOPIC ULTRASOUND (EUS);  Surgeon: Carol Ada, MD;  Location: [facility];  Service: Endoscopy;  Laterality: N/A;    Social History   Socioeconomic History  . Marital status: Married    Spouse  name: Not on file  . Number of children: 3  . Years of education: Not on file  . Highest education level: Not on file  Occupational History  . Not on file  Social Needs  . Financial resource strain: Not on file  . Food insecurity:    Worry: Not on file    Inability: Not on file  . Transportation needs:    Medical: Not on file    Non-medical: Not on file  Tobacco Use  . Smoking status: Former Smoker    Packs/day: 1.00    Years: 35.00    Pack years: 35.00    Types: Cigarettes    Last attempt to quit: 05/04/2013    Years since quitting: 4.7  . Smokeless tobacco: Never Used  Substance and Sexual Activity  . Alcohol use: Yes    Alcohol/week: 4.0 standard drinks    Types: 4 Standard drinks or equivalent per week    Comment: none in 2 months, previously 1-2 drinks per week   . Drug use: No  . Sexual activity: Not on file  Lifestyle  . Physical activity:    Days per week: Not on file    Minutes per session: Not on file  . Stress: Not on file  Relationships  . Social connections:    Talks on phone: Not on file    Gets together: Not on file    Attends religious service: Not on file    Active member of club or organization: Not on file    Attends meetings of clubs or organizations: Not on file    Relationship status: Not on file  Other Topics Concern  . Not on file  Social History Narrative  . Not on file     FAMILY HISTORY:  We obtained a detailed, 4-generation family history.  Significant diagnoses are listed below: Family History  Problem Relation Age of Onset  . Diabetes Mother   . Breast cancer Mother 38  . Heart disease Father   . Brain cancer Maternal Grandfather     Mr. Bracco has two sons, ages 79 and 50 and a daughter age 39. He has grandchildren through his sons. Mr. Matheney has a full brother and sister, four half sister and four half brothers through his dad, and a half sister through his mother.  No cancer history for any of these individuals.   Mr.  Downie mother was diagnosed with breast cancer at 76, she is living at 60. She had one brother that died when he was 62 years old but Mr. Mcguirt does not know details. Mr. Reaume maternal grandmother died at 70 after complications from a tooth extraction, and his maternal grandfather had brain cancer and died at 92.   Mr. Gangemi father died recently at 20. He had five sisters and a brother, no cancer history. There is also no history of cancer in Mr. Thier paternal cousins. Mr. Howes paternal grandmother died at 22 and his paternal grandfather died at 15, he did have a sister (Mr. Topper great aunt) who had bone cancer at 26 and died at 74.  Mr. Nehme is unaware of previous family history of genetic testing for hereditary cancer risks. Patient's maternal ancestors are of [place] descent, and paternal ancestors are of [place] descent. There is no reported Ashkenazi Jewish ancestry. There is no known consanguinity.  GENETIC COUNSELING ASSESSMENT: THADD APUZZO is a 67 y.o. male with a personal history which is somewhat suggestive of a Hereditary Cancer Predisposition Syndrome. We, therefore, discussed and recommended the following at today's visit.   DISCUSSION: We discussed that about 5-10% of cancer cases are hereditary. We discussed the BRCA1 and BRCA2 genes specifically, noting that there are many more genes we can test.  We reviewed the characteristics, features and inheritance patterns of hereditary cancer syndromes. We also discussed genetic testing, including the appropriate family members to test, the process of testing, insurance coverage and turn-around-time for results. We discussed the implications of a negative, positive and/or variant of uncertain significant result. We recommended Mr. Mesta pursue genetic testing for the [facility] Multi-Cancer Panel + Brain Panel.  The Multi-Cancer Panel offered by [facility] includes sequencing and/or deletion duplication testing of the  following 83 genes: AIP, ALK, APC, ATM, AXIN2,BAP1,  BARD1, BLM, BMPR1A, BRCA1, BRCA2, BRIP1, CASR, CDC73, CDH1, CDK4, CDKN1B, CDKN1C, CDKN2A (p14ARF), CDKN2A (p16INK4a), CEBPA, CHEK2, CTNNA1, DICER1, DIS3L2, EGFR (c.2369C>T, p.Thr790Met variant only), EPCAM (Deletion/duplication testing only), FH, FLCN, GATA2, GPC3, GREM1 (Promoter region deletion/duplication testing only), HOXB13 (c.251G>A, p.Gly84Glu), HRAS, KIT, MAX, MEN1, MET, MITF (c.952G>A, p.Glu318Lys variant only), MLH1, MSH2, MSH3, MSH6, MUTYH, NBN, NF1, NF2, NTHL1, PALB2, PDGFRA, PHOX2B, PMS2, POLD1, POLE, POT1, PRKAR1A, PTCH1, PTEN, RAD50, RAD51C, RAD51D, RB1, RECQL4, RET, RUNX1, SDHAF2, SDHA (sequence changes only), SDHB, SDHC, SDHD, SMAD4, SMARCA4, SMARCB1, SMARCE1, STK11, SUFU, TERC, TERT, TMEM127, TP53, TSC1, TSC2, VHL, WRN and WT1.   The Brain Cancer Panel offered by [facility] includes sequencing and/or deletion duplication testing of the following genes: AIP ALK APC DICER1 EPCAM HRAS LZTR1 MEN1 MLH1 MSH2 MSH6 NF1 NF2 PHOX2B PMS2 PRKAR1A PTCH1 PTEN RB1 SMARCA4 SMARCB1 SMARCE1 SUFU TP53 TSC1 TSC2 VHL.   We discussed that if he is found to have a mutation in one of these genes it may alter future medical management recommendations such as increased cancer screenings and potentially open up other treatment options.  A positive result could also have implications for the patient's family members.  A Negative result would mean we were unable to identify a hereditary component to his cancer, but does not rule out the possibility of a hereditary basis for his cancer.  There could be mutations that are undetectable by current technology, or in genes not yet tested or identified to increase cancer risk.    We discussed the potential to find a Variant of Uncertain Significance or VUS.  These are variants that have not yet been identified as pathogenic or benign, and it is unknown if this variant is associated with increased cancer risk or if this is a  normal finding.  Most VUS's are reclassified to benign or likely benign.   It should not be used to make medical management decisions. With time, we suspect the lab will determine the significance of any VUS's identified if any.   Based on Mr. Pafford personal history of pancreatic cancer, he meets medical criteria for genetic testing. Despite that he meets criteria, he may still have an out of pocket cost. The lab will notify him of an out of pocket cost, if any.   PLAN: After considering the risks, benefits, and limitations, Mr. Wegner  provided informed consent to pursue genetic testing and the blood sample was sent to [facility] for analysis of the Multi-Cancer Panel + Brain Cancer Panel. Results should be available within approximately 2-3 weeks' time, at which point they will be disclosed by telephone to Mr. Golson, as will any additional recommendations warranted by these results. Mr. Cornfield will receive a summary of his genetic counseling visit and a copy of his results once available. This information will also be available in Epic.   Lastly, we encouraged Mr. Grays to remain in contact with cancer genetics annually so that we can continuously update the family history and inform him of any changes in cancer genetics and testing that may be of benefit for this family.   Mr.  Tolbert questions were answered to his satisfaction today. Our contact information was provided should additional questions or concerns arise. Thank you for the referral and allowing [place] to share in the care of your patient.   Epimenio Foot, MS Genetic Counselor [email or web address] Phone: [phone]  The patient was seen for a total of 40 minutes in face-to-face genetic counseling.  The patient was accompanied today by his wife, Crystal.

## 2018-01-23 ENCOUNTER — Inpatient Hospital Stay: Payer: Medicare Other

## 2018-01-23 DIAGNOSIS — C251 Malignant neoplasm of body of pancreas: Secondary | ICD-10-CM

## 2018-01-23 MED ORDER — HEPARIN SOD (PORK) LOCK FLUSH 100 UNIT/ML IV SOLN
500.0000 [IU] | Freq: Once | INTRAVENOUS | Status: AC | PRN
  Administered 2018-01-23: 500 [IU]
  Filled 2018-01-23: qty 5

## 2018-01-23 MED ORDER — PEGFILGRASTIM-CBQV 6 MG/0.6ML ~~LOC~~ SOSY
6.0000 mg | PREFILLED_SYRINGE | Freq: Once | SUBCUTANEOUS | Status: AC
  Administered 2018-01-23: 6 mg via SUBCUTANEOUS

## 2018-01-23 MED ORDER — PEGFILGRASTIM-CBQV 6 MG/0.6ML ~~LOC~~ SOSY
PREFILLED_SYRINGE | SUBCUTANEOUS | Status: AC
  Filled 2018-01-23: qty 0.6

## 2018-01-23 MED ORDER — SODIUM CHLORIDE 0.9% FLUSH
10.0000 mL | INTRAVENOUS | Status: DC | PRN
  Administered 2018-01-23: 10 mL
  Filled 2018-01-23: qty 10

## 2018-01-23 NOTE — Patient Instructions (Signed)
Pegfilgrastim injection What is this medicine? PEGFILGRASTIM (PEG fil gra stim) is a long-acting granulocyte colony-stimulating factor that stimulates the growth of neutrophils, a type of white blood cell important in the body's fight against infection. It is used to reduce the incidence of fever and infection in patients with certain types of cancer who are receiving chemotherapy that affects the bone marrow, and to increase survival after being exposed to high doses of radiation. This medicine may be used for other purposes; ask your health care provider or pharmacist if you have questions. COMMON BRAND NAME(S): Neulasta What should I tell my health care provider before I take this medicine? They need to know if you have any of these conditions: -kidney disease -latex allergy -ongoing radiation therapy -sickle cell disease -skin reactions to acrylic adhesives (On-Body Injector only) -an unusual or allergic reaction to pegfilgrastim, filgrastim, other medicines, foods, dyes, or preservatives -pregnant or trying to get pregnant -breast-feeding How should I use this medicine? This medicine is for injection under the skin. If you get this medicine at home, you will be taught how to prepare and give the pre-filled syringe or how to use the On-body Injector. Refer to the patient Instructions for Use for detailed instructions. Use exactly as directed. Tell your healthcare provider immediately if you suspect that the On-body Injector may not have performed as intended or if you suspect the use of the On-body Injector resulted in a missed or partial dose. It is important that you put your used needles and syringes in a special sharps container. Do not put them in a trash can. If you do not have a sharps container, call your pharmacist or healthcare provider to get one. Talk to your pediatrician regarding the use of this medicine in children. While this drug may be prescribed for selected conditions,  precautions do apply. Overdosage: If you think you have taken too much of this medicine contact a poison control center or emergency room at once. NOTE: This medicine is only for you. Do not share this medicine with others. What if I miss a dose? It is important not to miss your dose. Call your doctor or health care professional if you miss your dose. If you miss a dose due to an On-body Injector failure or leakage, a new dose should be administered as soon as possible using a single prefilled syringe for manual use. What may interact with this medicine? Interactions have not been studied. Give your health care provider a list of all the medicines, herbs, non-prescription drugs, or dietary supplements you use. Also tell them if you smoke, drink alcohol, or use illegal drugs. Some items may interact with your medicine. This list may not describe all possible interactions. Give your health care provider a list of all the medicines, herbs, non-prescription drugs, or dietary supplements you use. Also tell them if you smoke, drink alcohol, or use illegal drugs. Some items may interact with your medicine. What should I watch for while using this medicine? You may need blood work done while you are taking this medicine. If you are going to need a MRI, CT scan, or other procedure, tell your doctor that you are using this medicine (On-Body Injector only). What side effects may I notice from receiving this medicine? Side effects that you should report to your doctor or health care professional as soon as possible: -allergic reactions like skin rash, itching or hives, swelling of the face, lips, or tongue -dizziness -fever -pain, redness, or irritation at site   where injected -pinpoint red spots on the skin -red or dark-brown urine -shortness of breath or breathing problems -stomach or side pain, or pain at the shoulder -swelling -tiredness -trouble passing urine or change in the amount of urine Side  effects that usually do not require medical attention (report to your doctor or health care professional if they continue or are bothersome): -bone pain -muscle pain This list may not describe all possible side effects. Call your doctor for medical advice about side effects. You may report side effects to FDA at 1-800-FDA-1088. Where should I keep my medicine? Keep out of the reach of children. Store pre-filled syringes in a refrigerator between 2 and 8 degrees C (36 and 46 degrees F). Do not freeze. Keep in carton to protect from light. Throw away this medicine if it is left out of the refrigerator for more than 48 hours. Throw away any unused medicine after the expiration date. NOTE: This sheet is a summary. It may not cover all possible information. If you have questions about this medicine, talk to your doctor, pharmacist, or health care provider.  2018 [employer] Standard (2016-04-04 12:58:03)  

## 2018-01-23 NOTE — Progress Notes (Signed)
  Oncology Nurse Navigator Documentation  Navigator Location: [facility] (01/23/18 1215)   )Navigator Encounter Type: Telephone (01/23/18 1215) Telephone: Outgoing Call;Patient Update (01/23/18 1215)    Called and spoke with patient's wife to assess for navigation needs. Patient is tolerating first chemo tx without complaints. He is using his prochlorperazine. No questions or needs voiced. They have my contact information for questions or concerns.             Treatment Initiated Date: 01/21/18 (01/23/18 1215)     Barriers/Navigation Needs: No Questions;No Needs (01/23/18 1215)   Interventions: Psycho-social support (01/23/18 1215)            Acuity: Level 2 (01/23/18 1215)   Acuity Level 2: Ongoing guidance and education throughout treatment as needed (01/23/18 1215)     Time Spent with Patient: 15 (01/23/18 1215)

## 2018-01-26 ENCOUNTER — Other Ambulatory Visit (HOSPITAL_COMMUNITY): Payer: Medicare Other

## 2018-01-26 ENCOUNTER — Ambulatory Visit (HOSPITAL_COMMUNITY): Payer: Medicare Other

## 2018-01-30 ENCOUNTER — Ambulatory Visit: Payer: Medicare Other | Admitting: Cardiovascular Disease

## 2018-01-30 DIAGNOSIS — R0989 Other specified symptoms and signs involving the circulatory and respiratory systems: Secondary | ICD-10-CM

## 2018-02-02 ENCOUNTER — Encounter: Payer: Self-pay | Admitting: *Deleted

## 2018-02-03 ENCOUNTER — Other Ambulatory Visit: Payer: Self-pay | Admitting: Hematology

## 2018-02-03 ENCOUNTER — Inpatient Hospital Stay: Payer: Medicare Other

## 2018-02-03 ENCOUNTER — Encounter: Payer: Self-pay | Admitting: Hematology

## 2018-02-03 ENCOUNTER — Inpatient Hospital Stay: Payer: Medicare Other | Admitting: Nurse Practitioner

## 2018-02-03 ENCOUNTER — Telehealth: Payer: Self-pay | Admitting: Hematology

## 2018-02-03 ENCOUNTER — Encounter: Payer: Self-pay | Admitting: Nurse Practitioner

## 2018-02-03 VITALS — BP 121/67 | HR 66 | Temp 98.1°F | Resp 17 | Ht 71.0 in | Wt 121.4 lb

## 2018-02-03 DIAGNOSIS — C251 Malignant neoplasm of body of pancreas: Secondary | ICD-10-CM

## 2018-02-03 DIAGNOSIS — I10 Essential (primary) hypertension: Secondary | ICD-10-CM | POA: Diagnosis not present

## 2018-02-03 DIAGNOSIS — K649 Unspecified hemorrhoids: Secondary | ICD-10-CM | POA: Diagnosis not present

## 2018-02-03 DIAGNOSIS — F419 Anxiety disorder, unspecified: Secondary | ICD-10-CM | POA: Diagnosis not present

## 2018-02-03 DIAGNOSIS — Z803 Family history of malignant neoplasm of breast: Secondary | ICD-10-CM

## 2018-02-03 DIAGNOSIS — Z95828 Presence of other vascular implants and grafts: Secondary | ICD-10-CM

## 2018-02-03 DIAGNOSIS — Z808 Family history of malignant neoplasm of other organs or systems: Secondary | ICD-10-CM

## 2018-02-03 DIAGNOSIS — E119 Type 2 diabetes mellitus without complications: Secondary | ICD-10-CM

## 2018-02-03 LAB — CBC WITH DIFFERENTIAL (CANCER CENTER ONLY)
Abs Immature Granulocytes: 0.02 10*3/uL (ref 0.00–0.07)
BASOS ABS: 0 10*3/uL (ref 0.0–0.1)
Basophils Relative: 0 %
EOS ABS: 0.1 10*3/uL (ref 0.0–0.5)
EOS PCT: 3 %
HCT: 31 % — ABNORMAL LOW (ref 39.0–52.0)
HEMOGLOBIN: 10.2 g/dL — AB (ref 13.0–17.0)
Immature Granulocytes: 0 %
LYMPHS PCT: 22 %
Lymphs Abs: 1 10*3/uL (ref 0.7–4.0)
MCH: 29.7 pg (ref 26.0–34.0)
MCHC: 32.9 g/dL (ref 30.0–36.0)
MCV: 90.4 fL (ref 80.0–100.0)
Monocytes Absolute: 0.3 10*3/uL (ref 0.1–1.0)
Monocytes Relative: 7 %
NRBC: 0 % (ref 0.0–0.2)
Neutro Abs: 3.1 10*3/uL (ref 1.7–7.7)
Neutrophils Relative %: 68 %
Platelet Count: 179 10*3/uL (ref 150–400)
RBC: 3.43 MIL/uL — AB (ref 4.22–5.81)
RDW: 12.7 % (ref 11.5–15.5)
WBC: 4.7 10*3/uL (ref 4.0–10.5)

## 2018-02-03 LAB — COMPREHENSIVE METABOLIC PANEL
ALK PHOS: 46 U/L (ref 38–126)
ALT: 22 U/L (ref 0–44)
ANION GAP: 9 (ref 5–15)
AST: 20 U/L (ref 15–41)
Albumin: 3.8 g/dL (ref 3.5–5.0)
BUN: 13 mg/dL (ref 8–23)
CALCIUM: 9.1 mg/dL (ref 8.9–10.3)
CO2: 26 mmol/L (ref 22–32)
Chloride: 103 mmol/L (ref 98–111)
Creatinine, Ser: 0.84 mg/dL (ref 0.61–1.24)
GLUCOSE: 232 mg/dL — AB (ref 70–99)
POTASSIUM: 3.8 mmol/L (ref 3.5–5.1)
Sodium: 138 mmol/L (ref 135–145)
TOTAL PROTEIN: 6.1 g/dL — AB (ref 6.5–8.1)
Total Bilirubin: 0.4 mg/dL (ref 0.3–1.2)

## 2018-02-03 MED ORDER — FLUOROURACIL CHEMO INJECTION 2.5 GM/50ML
400.0000 mg/m2 | Freq: Once | INTRAVENOUS | Status: AC
  Administered 2018-02-03: 700 mg via INTRAVENOUS
  Filled 2018-02-03: qty 14

## 2018-02-03 MED ORDER — DEXAMETHASONE SODIUM PHOSPHATE 10 MG/ML IJ SOLN
INTRAMUSCULAR | Status: AC
  Filled 2018-02-03: qty 1

## 2018-02-03 MED ORDER — DEXTROSE 5 % IV SOLN
Freq: Once | INTRAVENOUS | Status: AC
  Administered 2018-02-03: 11:00:00 via INTRAVENOUS
  Filled 2018-02-03: qty 250

## 2018-02-03 MED ORDER — SODIUM CHLORIDE 0.9% FLUSH
10.0000 mL | INTRAVENOUS | Status: DC | PRN
  Filled 2018-02-03: qty 10

## 2018-02-03 MED ORDER — ATROPINE SULFATE 1 MG/ML IJ SOLN
0.5000 mg | Freq: Once | INTRAMUSCULAR | Status: AC | PRN
  Administered 2018-02-03: 0.5 mg via INTRAVENOUS

## 2018-02-03 MED ORDER — ACETAMINOPHEN 500 MG PO TABS
ORAL_TABLET | ORAL | Status: AC
  Filled 2018-02-03: qty 1

## 2018-02-03 MED ORDER — HYDROCODONE-ACETAMINOPHEN 5-325 MG PO TABS
1.0000 | ORAL_TABLET | Freq: Once | ORAL | Status: AC
  Administered 2018-02-03: 1 via ORAL

## 2018-02-03 MED ORDER — DEXAMETHASONE SODIUM PHOSPHATE 10 MG/ML IJ SOLN
10.0000 mg | Freq: Once | INTRAMUSCULAR | Status: AC
  Administered 2018-02-03: 10 mg via INTRAVENOUS

## 2018-02-03 MED ORDER — ACETAMINOPHEN 500 MG PO TABS
500.0000 mg | ORAL_TABLET | Freq: Once | ORAL | Status: AC
  Administered 2018-02-03: 500 mg via ORAL

## 2018-02-03 MED ORDER — HYDROCORTISONE 2.5 % RE CREA: 1.0000 "application " | TOPICAL_CREAM | Freq: Two times a day (BID) | RECTAL | 1 refills | Status: AC | PRN

## 2018-02-03 MED ORDER — SODIUM CHLORIDE 0.9 % IV SOLN: 400.0000 mg/m2 | Freq: Once | INTRAVENOUS | Status: DC

## 2018-02-03 MED ORDER — SODIUM CHLORIDE 0.9 % IV SOLN
400.0000 mg/m2 | Freq: Once | INTRAVENOUS | Status: AC
  Administered 2018-02-03: 676 mg via INTRAVENOUS
  Filled 2018-02-03: qty 33.8

## 2018-02-03 MED ORDER — LORAZEPAM 0.5 MG PO TABS: 0.2500 mg | ORAL_TABLET | Freq: Three times a day (TID) | ORAL | 0 refills | Status: DC | PRN

## 2018-02-03 MED ORDER — DEXTROSE 5 % IV SOLN
Freq: Once | INTRAVENOUS | Status: DC
  Filled 2018-02-03: qty 250

## 2018-02-03 MED ORDER — SODIUM CHLORIDE 0.9% FLUSH
10.0000 mL | Freq: Once | INTRAVENOUS | Status: AC
  Administered 2018-02-03: 10 mL
  Filled 2018-02-03: qty 10

## 2018-02-03 MED ORDER — INFLUENZA VAC SPLIT HIGH-DOSE 0.5 ML IM SUSY
0.5000 mL | PREFILLED_SYRINGE | Freq: Once | INTRAMUSCULAR | Status: AC
  Administered 2018-02-03: 0.5 mL via INTRAMUSCULAR
  Filled 2018-02-03: qty 0.5

## 2018-02-03 MED ORDER — SODIUM CHLORIDE 0.9 % IV SOLN
2400.0000 mg/m2 | INTRAVENOUS | Status: DC
  Administered 2018-02-03: 4050 mg via INTRAVENOUS
  Filled 2018-02-03: qty 81

## 2018-02-03 MED ORDER — ATROPINE SULFATE 1 MG/ML IJ SOLN
INTRAMUSCULAR | Status: AC
  Filled 2018-02-03: qty 1

## 2018-02-03 MED ORDER — HEPARIN SOD (PORK) LOCK FLUSH 100 UNIT/ML IV SOLN
500.0000 [IU] | Freq: Once | INTRAVENOUS | Status: DC | PRN
  Filled 2018-02-03: qty 5

## 2018-02-03 MED ORDER — OXALIPLATIN CHEMO INJECTION 100 MG/20ML
88.0000 mg/m2 | Freq: Once | INTRAVENOUS | Status: AC
  Administered 2018-02-03: 150 mg via INTRAVENOUS
  Filled 2018-02-03: qty 30

## 2018-02-03 MED ORDER — HYDROCODONE-ACETAMINOPHEN 5-325 MG PO TABS
ORAL_TABLET | ORAL | Status: AC
  Filled 2018-02-03: qty 1

## 2018-02-03 MED ORDER — PALONOSETRON HCL INJECTION 0.25 MG/5ML
0.2500 mg | Freq: Once | INTRAVENOUS | Status: AC
  Administered 2018-02-03: 0.25 mg via INTRAVENOUS

## 2018-02-03 MED ORDER — SODIUM CHLORIDE 0.9 % IV SOLN
150.0000 mg/m2 | Freq: Once | INTRAVENOUS | Status: AC
  Administered 2018-02-03: 260 mg via INTRAVENOUS
  Filled 2018-02-03: qty 13

## 2018-02-03 MED ORDER — PALONOSETRON HCL INJECTION 0.25 MG/5ML
INTRAVENOUS | Status: AC
  Filled 2018-02-03: qty 5

## 2018-02-03 NOTE — Progress Notes (Addendum)
[place]  [phone] Fax:([phone]  Clinic Follow up Note   Patient Care Team: Nolene Ebbs, MD as PCP - General (Internal Medicine) Lorretta Harp, MD as PCP - Cardiology (Cardiology) Carol Ada, MD as Consulting Physician (Gastroenterology) Truitt Merle, MD as Consulting Physician (Hematology) 02/03/2018  SUMMARY OF ONCOLOGIC HISTORY: Oncology History   Cancer Staging Malignant neoplasm of body of pancreas [facility]) Staging form: Exocrine Pancreas, AJCC 8th Edition - Clinical stage from 12/19/2017: Stage IIA (cT3, cN0, cM0) - Signed by Truitt Merle, MD on 01/13/2018       Malignant neoplasm of body of pancreas ([place])   12/11/2017 Imaging    CT AP WITH CONTRAST IMPRESSION: Focal area of abnormal low attenuation in the pancreatic body measuring 3.7 x 2.9 cm with cut off of the duct most concerning for primary pancreatic neoplasm.  2 small low-attenuation foci identified within the liver which are nonspecific however could represent metastatic disease; one is 1.0 cm complex lesion within the hepatic dome, the other area is a low-attenuation focus adjacent to the falciform ligament likely representing a perfusion anomaly or possible local fatty infiltration  Extensive atherosclerotic changes in the abdominal aorta, iliac vessels, splenic vessels.  Occlusion of the right and possibly left iliac.  Suspected chronic re-constitution of flow identified within the femoral vessels bilaterally.  Prostate brachytherapy consistent with history of malignancy  No evidence of small bowel obstruction.  Findings suggestive of constipation     12/19/2017 Procedure    EUS biopsy: In the body of the pancreas there was a large 20 x 30 mm, approximately, hypoechoic mass. It had irregular margin, but it was round/oval in appearance. There was pancreatic tail atrophy, but the PD was dilated up to 7.6 mm. The celiac axis was clearly viewed and there was no evidence of  invasion. In the head of the pancreas a 1 cm lymph node was identified and the pancreas was lobular in the head and uncinate process. The left adrenal was clearly identified and it was enlarged, but there was no evidence of any masses.    12/19/2017 Initial Biopsy    Diagnosis FINE NEEDLE ASPIRATION,ENDOSCOPIC, PANCREAS BODY (SPECIMEN 1 OF 1 COLLECTED 12/19/17): MALIGNANT CELLS CONSISTENT WITH ADENOCARCINOMA.    12/19/2017 Initial Diagnosis    Malignant neoplasm of body of pancreas ([place])    12/19/2017 Cancer Staging    Staging form: Exocrine Pancreas, AJCC 8th Edition - Clinical stage from 12/19/2017: Stage IIA (cT3, cN0, cM0) - Signed by Truitt Merle, MD on 01/13/2018    01/05/2018 Imaging    01/05/2018 MRI Abdomen IMPRESSION: 1. Poorly marginated hypoenhancing 4.2 x 2.5 x 5.2 cm pancreatic body mass with pancreatic tail atrophy and duct dilation, compatible with known pancreatic adenocarcinoma. Pancreatic tumor appears locally advanced with significant vascular involvement as detailed. No biliary ductal dilatation. 2. No abdominal adenopathy. 3. Two similar subcentimeter enhancing left liver lobe masses as detailed, which are too small to accurately characterize on this mildly motion degraded scan. Differential includes small metastases or hemangiomas. Suggest follow-up MRI abdomen without and with IV contrast in 3 months. These lesions are probably too small to characterize by PET-CT. 4.  Aortic Atherosclerosis (ICD10-I70.0).    01/06/2018 Imaging    01/06/2018 CT Chest IMPRESSION: 1. Two non-specific small left lower lobe pulmonary nodules. Otherwise, no evidence of metastatic disease within the chest. 2.  Aortic Atherosclerosis (ICD10-I70.0).  Emphysema (ICD10-J43.9).    01/13/2018 Tumor Marker    Tumor Marker Results for KENLEY, TROOP B (  MRN [id number]) as of 01/13/2018 09:17  Ref. Range 12/30/2017 14:59  CA 19-9 Latest Ref Range: 0 - 35 U/mL 1,263 (H)      01/20/2018 -   Chemotherapy    The patient had palonosetron (ALOXI) injection 0.25 mg, 0.25 mg, Intravenous,  Once, 2 of 4 cycles Administration: 0.25 mg (01/21/2018) pegfilgrastim-cbqv (UDENYCA) injection 6 mg, 6 mg, Subcutaneous, Once, 2 of 4 cycles Administration: 6 mg (01/23/2018) irinotecan (CAMPTOSAR) 260 mg in sodium chloride 0.9 % 500 mL chemo infusion, 150 mg/m2 = 260 mg (100 % of original dose 150 mg/m2), Intravenous,  Once, 2 of 4 cycles Dose modification: 150 mg/m2 (original dose 150 mg/m2, Cycle 1, Reason: Provider Judgment) Administration: 260 mg (01/21/2018) leucovorin 676 mg in sodium chloride 0.9 % 250 mL infusion, 400 mg/m2 = 676 mg, Intravenous,  Once, 2 of 4 cycles Administration: 676 mg (01/21/2018) oxaliplatin (ELOXATIN) 120 mg in dextrose 5 % 500 mL chemo infusion, 70 mg/m2 = 120 mg (100 % of original dose 70 mg/m2), Intravenous,  Once, 2 of 4 cycles Dose modification: 70 mg/m2 (original dose 70 mg/m2, Cycle 1, Reason: Provider Judgment, Comment: first cycle only ) Administration: 120 mg (01/21/2018) fluorouracil (ADRUCIL) chemo injection 700 mg, 400 mg/m2 = 700 mg, Intravenous,  Once, 2 of 4 cycles Administration: 700 mg (01/21/2018) fluorouracil (ADRUCIL) 4,050 mg in sodium chloride 0.9 % 69 mL chemo infusion, 2,400 mg/m2 = 4,050 mg, Intravenous, 1 Day/Dose, 2 of 4 cycles Administration: 4,050 mg (01/21/2018)  for chemotherapy treatment.    CURRENT THERAPY: first line chemo FOLFIRINOX every 2 weeks; starting 01/21/18. Oxaliplatin dose reduced with cycle 1  INTERVAL HISTORY: Mr. Jagiello returns for follow up and cycle 2 FOLFIRINOX as scheduled. He completed cycle 1 with dose-reduced oxaliplatin on 10/2. He received Udenyca with pump d/c on 10/4, took Claritin for 3 days and did not experience bone pain. He feels that treatment went well. He was mildly fatigued for 3 days with 5FU pump, did not eat or drink much. By day 4 his taste came back and po intake increased. He think mirtazapine began  helping after 1 week. Drinks 3 boost per day. He saw dietician. Denies mucositis. He was constipated without BM from days 2-4, took Senakot, colace, and mag citrate and had BM which are normal now. He has hemorrhoid flare up with minimal bleeding. Preparation H does not help. Denies diarrhea. Takes compazine qHS preventatively, otherwise denies n/v. Cold sensitivity lasted 6 days; denies neuropathy. He had mild dizziness on standing since chemotherapy, that worsened today when he "felt faint" with blood draw. His sister says he never did well with blood. Abdominal pain is "easing a little;" he rates it 7/10. Takes norco 5-325 and tylenol 325 mg simultaneously which improves his band-like abdominal pain for 4-5 hours.     MEDICAL HISTORY:  Past Medical History:  Diagnosis Date  . Arthritis   . Carotid artery disease ([place]) followed by dr berry--- per last duplex 27-09-2374  LICA 28-31%, bilateral ECA >50%,  patent RICA post intervention   hx acute high grade stenosis RICA >80% 02-22-2009 in setting of  stroke  s/p  right carotid endarterectomy 02-28-2009/    . Claudication of both lower extremities ([place])    due to PAD ----  right > left  . COPD (chronic obstructive pulmonary disease) ([place])   . Coronary artery disease CARDIOLOGIST-  DR BERRY   hx positive myoview 07-29-2013;  08-16-2013 per cardiac cath, severe 3V CAD w/ 60% subclavian stenosis;  08-23-2013  s/p  CABG x3  (LIMA to LAD, SVG to OM1, SVG to PDA)  . CVA (cerebral vascular accident) ([place]) 02/22/2009   acute infarct right hemisphere w/ severe RICA stentosis----  per pt no residuals  . Family history of brain cancer   . Family history of breast cancer   . GERD (gastroesophageal reflux disease)   . HTN (hypertension)   . Hyperlipidemia   . Hyperplasia of prostate with lower urinary tract symptoms (LUTS)   . Peripheral arterial disease ([place]) followed by dr berry--- last dobbler ABIs bilateral SFA occlusions (pt is scheduled for  intervention 05-08-2017   hx PTA and stenting to bilateral SFA 2002 and PTA w/ stenting left common iliac artery and for in-stent restenosis 2003 Left SFA and 2005  stenting right SFA for in-stent restenosis  . Prostate cancer [facility]) urologist-  dr ottelin/  oncologist-  dr Tammi Klippel   dx 11-20-2016-- Stage T1c, Gleason 4+3, PSA 5.73, vol 35.95cc--- scheduled for radioactive seed implants 04-11-2017  . 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 S/P peripheral artery angioplasty with stent placement    bilateral SFA 2002;  in-stenosis bilateral SFA  (right 2003, left 2005) and left CIA 2003  . Shortness of breath   . Type II diabetes mellitus ([place])   . Weak urinary stream   . Wears glasses     SURGICAL HISTORY: Past Surgical History:  Procedure Laterality Date  . ABDOMINAL AORTOGRAM N/A 05/08/2017   Procedure: ABDOMINAL AORTOGRAM;  Surgeon: Lorretta Harp, MD;  Location: [place] [facility];  Service: Cardiovascular;  Laterality: N/A;  . CARDIAC CATHETERIZATION  12-01-2000    dr berry    non-critial CAD & nonischemic cardiolite-- mLAD 50%, mLCx 70%, prox. to mid RCA 75% focal midsegment 80-85%  . CARDIOVASCULAR STRESS TEST  07-29-2013    dr berry   High risk nuclear study w/ inferoseptal ischemia/  normal LV function and wall motion,  nuclear study ef 52%  . CAROTID ENDARTERECTOMY Right 02-28-2009    dr c. Scot Dock  [facility]   patch angioplasty  . COLONOSCOPY    . CORONARY ARTERY BYPASS GRAFT N/A 08/23/2013   Procedure: CORONARY ARTERY BYPASS GRAFTING (CABG);  Surgeon: Melrose Nakayama, MD;  Location: [place];  Service: Open Heart Surgery;  Laterality: N/A;  Coronary Artery Bypass graft times three using left internal mammary artery and left leg saphenous vein   . CYSTOSCOPY N/A 04/11/2017   Procedure: CYSTOSCOPY FLEXIBLE;  Surgeon: Kathie Rhodes, MD;  Location: [facility];  Service: Urology;  Laterality: N/A;  no seeds found in bladder  . ENDOVASCULAR  STENT INSERTION Bilateral right 12-25-2000 ;  left 03-02-2001   dr berry   PTA and stenting to bilateral SFA  . ENDOVASCULAR STENT INSERTION Bilateral right 08-30-2003 ;  left 11-10-2001    dr berry   PTA and stenting for in-stent restenosis right SFA 08-30-2003:  PTA and stenting distal left common iliac artery and in-stent restenosis left SFA 11-10-2001  . FINE NEEDLE ASPIRATION N/A 12/19/2017   Procedure: FINE NEEDLE ASPIRATION (FNA) LINEAR;  Surgeon: Carol Ada, MD;  Location: [facility];  Service: Endoscopy;  Laterality: N/A;  . INTRAOPERATIVE TRANSESOPHAGEAL ECHOCARDIOGRAM N/A 08/23/2013   Procedure: INTRAOPERATIVE TRANSESOPHAGEAL ECHOCARDIOGRAM;  Surgeon: Melrose Nakayama, MD;  Location: [place];  Service: Open Heart Surgery;  Laterality: N/A;  . IR IMAGING GUIDED PORT INSERTION  01/20/2018  . LEFT HEART CATHETERIZATION WITH CORONARY ANGIOGRAM  N/A 08/16/2013   Procedure: LEFT HEART CATHETERIZATION WITH CORONARY ANGIOGRAM;  Surgeon: Lorretta Harp, MD;  Location: [facility] CATH LAB;  Service: Cardiovascular;  Laterality: N/A;   severe 3V CAD and 60% subclavian sternosis  . LOWER EXTREMITY ANGIOGRAM N/A 08/16/2013   Procedure: LOWER EXTREMITY ANGIOGRAM;  Surgeon: Lorretta Harp, MD;  Location[facility] C[facility] LAB;  Service: Cardiovascular;  Laterality: N/A;  . LOWER EXTREMITY ANGIOGRAPHY Bilateral 05/08/2017   Procedure: Lower Extremity Angiography;  Surgeon: Lorretta Harp, MD;  Location: [place] [facility];  Service: Cardiovascular;  Laterality: Bilateral;  . PERIPHERAL VASCULAR BALLOON ANGIOPLASTY Left 05/08/2017   Procedure: PERIPHERAL VASCULAR BALLOON ANGIOPLASTY;  Surgeon: Lorretta Harp, MD;  Location: [place] [facility];  Service: Cardiovascular;  Laterality: Left;  COMMON ILIAC  . RADIOACTIVE SEED IMPLANT N/A 04/11/2017   Procedure: RADIOACTIVE SEED IMPLANT/BRACHYTHERAPY IMPLANT;  Surgeon: Kathie Rhodes, MD;  Location: [facility];  Service: Urology;  Laterality: N/A;    67    seeds implanted  . SPACE OAR INSTILLATION N/A 04/11/2017   Procedure: SPACE OAR INSTILLATION;  Surgeon: Kathie Rhodes, MD;  Location: [facility];  Service: Urology;  Laterality: N/A;  . TRANSTHORACIC ECHOCARDIOGRAM  07/15/2013   ef 08-65%, grade 1 diastolic dysfunction/  trivial MR, mild to moderate PR, mild TR   . UPPER ESOPHAGEAL ENDOSCOPIC ULTRASOUND (EUS) N/A 12/19/2017   Procedure: UPPER ESOPHAGEAL ENDOSCOPIC ULTRASOUND (EUS);  Surgeon: Carol Ada, MD;  Location: [facility];  Service: Endoscopy;  Laterality: N/A;    I have reviewed the social history and family history with the patient and they are unchanged from previous note.  ALLERGIES:  has No Known Allergies.  MEDICATIONS:  Current Outpatient Medications  Medication Sig Dispense Refill  . aspirin 81 MG chewable tablet Chew 1 tablet (81 mg total) by mouth daily. (Patient taking differently: Chew 81 mg by mouth at bedtime. )    . cilostazol (PLETAL) 50 MG tablet Take 1 tablet (50 mg total) by mouth 2 (two) times daily. 60 tablet 6  . clopidogrel (PLAVIX) 75 MG tablet Take 75 mg by mouth at bedtime.     Marland Kitchen dexlansoprazole (DEXILANT) 60 MG capsule Take 60 mg by mouth daily as needed (for reflux.).     Marland Kitchen doxycycline (VIBRAMYCIN) 100 MG capsule Take 100 mg by mouth daily as needed for itching.  1  . HYDROcodone-acetaminophen (NORCO) 5-325 MG tablet Take 1-2 tablets by mouth every 6 (six) hours as needed for moderate pain. 45 tablet 0  . hydrocortisone (ANUSOL-HC) 2.5 % rectal cream Place 1 application rectally 2 (two) times daily as needed for hemorrhoids. 30 g 1  . hydrocortisone 2.5 % ointment Apply 1 application topically 2 (two) times daily as needed (for skin irritation).   0  . lidocaine-prilocaine (EMLA) cream Apply 1 application topically as needed (local anesthesia).    . LORazepam (ATIVAN) 0.5 MG tablet Take 0.5 tablets (0.25 mg total) by mouth every 8 (eight) hours as needed for anxiety. 30 tablet 0  .  metFORMIN (GLUCOPHAGE) 500 MG tablet Take 500 mg by mouth at bedtime.   2  . metoprolol succinate (TOPROL-XL) 25 MG 24 hr tablet Take 25 mg by mouth at bedtime.     . mirtazapine (REMERON) 7.5 MG tablet Take 1 tablet (7.5 mg total) by mouth at bedtime. 30 tablet 1  . Multiple Vitamins-Minerals (MULTIVITAMIN WITH MINERALS) tablet Take 1 tablet by mouth daily.    . ondansetron (ZOFRAN) 8 MG tablet Take 1  tablet (8 mg total) by mouth 2 (two) times daily as needed for refractory nausea / vomiting. Start on day 3 after chemotherapy. 30 tablet 1  . prochlorperazine (COMPAZINE) 10 MG tablet Take 1 tablet (10 mg total) by mouth every 6 (six) hours as needed (NAUSEA). 30 tablet 1  . simvastatin (ZOCOR) 20 MG tablet Take 20 mg by mouth at bedtime.     . tamsulosin (FLOMAX) 0.4 MG CAPS capsule Take 0.4 mg by mouth every evening.  11  . traMADol (ULTRAM) 50 MG tablet Take 50 mg by mouth every 6 (six) hours as needed (pain).      No current facility-administered medications for this visit.    Facility-Administered Medications Ordered in Other Visits  Medication Dose Route Frequency Provider Last Rate Last Dose  . atropine injection 0.5 mg  0.5 mg Intravenous Once PRN Truitt Merle, MD      . dextrose 5 % solution   Intravenous Once Truitt Merle, MD      . fluorouracil (ADRUCIL) 4,050 mg in sodium chloride 0.9 % 69 mL chemo infusion  2,400 mg/m2 (Treatment Plan Recorded) Intravenous 1 day or 1 dose Truitt Merle, MD      . fluorouracil (ADRUCIL) chemo injection 700 mg  400 mg/m2 (Treatment Plan Recorded) Intravenous Once Truitt Merle, MD      . heparin lock flush 100 unit/mL  500 Units Intracatheter Once PRN Truitt Merle, MD      . irinotecan (CAMPTOSAR) 260 mg in sodium chloride 0.9 % 500 mL chemo infusion  150 mg/m2 (Treatment Plan Recorded) Intravenous Once Truitt Merle, MD      . leucovorin 676 mg in sodium chloride 0.9 % 250 mL infusion  400 mg/m2 (Treatment Plan Recorded) Intravenous Once Truitt Merle, MD      . oxaliplatin  (ELOXATIN) 150 mg in dextrose 5 % 500 mL chemo infusion  88 mg/m2 (Treatment Plan Recorded) Intravenous Once Truitt Merle, MD 265 mL/hr at 02/03/18 1229 150 mg at 02/03/18 1229  . sodium chloride flush (NS) 0.9 % injection 10 mL  10 mL Intracatheter PRN Truitt Merle, MD        PHYSICAL EXAMINATION: ECOG PERFORMANCE STATUS: 1 - Symptomatic but completely ambulatory  Vitals:   02/03/18 0934  BP: 121/67  Pulse: 66  Resp: 17  Temp: 98.1 F (36.7 C)  SpO2: 100%   Orthostatic BP readings in clinic: Lying 141/60, HR 57 Sitting 115/62, HR 63 Standing 103/84, HR 71    Filed Weights   02/03/18 0934  Weight: 121 lb 6.4 oz (55.1 kg)    GENERAL:alert, no distress and comfortable SKIN: no rashes or significant lesions EYES: sclera clear OROPHARYNX:no thrush or ulcers  LYMPH:  no palpable cervical or supraclavicular lymphadenopathy  LUNGS: distant breath sounds with normal breathing effort HEART: regular rate & rhythm, no lower extremity edema ABDOMEN:abdomen soft, non-tender and normal bowel sounds Musculoskeletal:no cyanosis of digits and no clubbing  NEURO: alert & oriented x 3 with fluent speech, no focal motor/sensory deficits PAC without erythema   LABORATORY DATA:  I have reviewed the data as listed CBC Latest Ref Rng & Units 02/03/2018 01/21/2018 01/20/2018  WBC 4.0 - 10.5 K/uL 4.7 4.5 4.3  Hemoglobin 13.0 - 17.0 g/dL 10.2(L) 13.3 12.9(L)  Hematocrit 39.0 - 52.0 % 31.0(L) 39.2 40.8  Platelets 150 - 400 K/uL 179 229 209     CMP Latest Ref Rng & Units 02/03/2018 01/21/2018 01/20/2018  Glucose 70 - 99 mg/dL 232(H) 170(H) 129(H)  BUN 8 -  23 mg/dL 13 9 12   Creatinine 0.61 - 1.24 mg/dL 0.84 0.82 0.79  Sodium 135 - 145 mmol/L 138 138 136  Potassium 3.5 - 5.1 mmol/L 3.8 4.7 3.6  Chloride 98 - 111 mmol/L 103 101 101  CO2 22 - 32 mmol/L 26 26 28   Calcium 8.9 - 10.3 mg/dL 9.1 9.9 9.4  Total Protein 6.5 - 8.1 g/dL 6.1(L) 7.4 -  Total Bilirubin 0.3 - 1.2 mg/dL 0.4 0.6 -  Alkaline Phos  38 - 126 U/L 46 42 -  AST 15 - 41 U/L 20 22 -  ALT 0 - 44 U/L 22 16 -      RADIOGRAPHIC STUDIES: I have personally reviewed the radiological images as listed and agreed with the findings in the report. No results found.   ASSESSMENT & PLAN: Michael Maddox is a [id number] y.o.male with history of prostate cancer presented with abdominal pain, 30 lbs weight loss, and constipation worsening over 3-4 months   1. Adenocarcinoma of body of pancreas, cT3N0Mx, unresectable, with indeterminate lung nodes and liver lesions 2. Adenocarcinoma of the prostate stage T1c -on 11/20/2016 with gleason score 4+3 and PSA 5.73 s/p radioactive seed implant per Dr. Tammi Klippel on 04/11/18, followed by Dr. Karsten Ro. 3.HTN, HL,DM -ON plavix, aspirin, metformin, followed by PCP Dr. Jeanie Cooks 4.TIA in 2010 s/p endarterectomy, CAD s/p CABG in 2015, PVD -requiring revascularization and stenting in the LEs and on anticoagulation with plavixand aspirin; followed by cardiologist Dr. [name]. 5. Genetics - had genetics appts on 10/3, labs pending  6.Goal of care discussion  -The patient understands the goal of care is palliative; he is full code now 7. Orthostatic hypotension  8. Hemorrhoids   Mr. Weida appears stable. He completed cycle 1 FOLFIRINOX with dose-reduced oxaliplatin. He tolerated well overall with constipation, mild fatigue and weight loss. He has anxiety on the day of his treatments, denies depression. I will prescribe low dose ativan for him to take PRN, especially with increased anxiety on days with chemo. His appetite is improved today, he continues mirtazapine and nutrition supplement. He is followed by dietician who will see him today. We reviewed food choices for diabetics, such as being conscious of carb and sugar intake. I discussed with Joli who recommends to continue boost due to it's higher calorie content rather than switch to glucerna, I agree. He does not check BG at home. I explained BG  can be elevated on chemo, and we will monitor carefully. If persistently elevated may increase metformin, he is currently taking 500 mg qHS.   He developed orthostatic hypotension and positional dizziness, likely related to BP medication and dehydration. I encouraged him to drink more. Will hold metoprolol for now. Due to large fluid volume he will receive with FOLFIRINOX today, I will arrange for him to receive 1 liter NS over 2 hours on 10/17 with pump d/c. He agrees to the plan.   For hemorrhoids, I prescribed annusol PRN today. He will hold aspirin PRN for bleeding hemorrhoids.   Labs reviewed, CBC with mild anemia Hgb 10.7, labs otherwise stable and adequate to proceed with cycle 2 FOLFIRINOX, with oxaliplatin increased to full dose. He is aware of the dosage increase today, and knows to call [facility] if he experiences increased side effects or new concerns. He continues pain regimen. CA 19-9 pending.   He will return for follow up in 2 weeks with cycle 3.  PLAN: -Labs reviewed  -Proceed with cycle 2 FOLFIRINOX, oxaliplatin increased to full dose -Hold metoprolol for  orthostatic hypotension -Hold aspirin for bleeding hemorrhoids -IVF with pump d/c on day 3 -Rx: annusol topical BID PRN, ativan 0.25 mg q8 PRN; prescriptions sent today   -High dose flu vaccine today  -F/u in 2 weeks with cycle 3 -CA 19-9 pending  -Dietician visit today during chemo   Orders Placed This Encounter  Procedures  . Comprehensive metabolic panel   All questions were answered. The patient knows to call the clinic with any problems, questions or concerns. No barriers to learning was detected. I spent 20 minutes counseling the patient face to face. Total time spent in the appointment was 25 minutes and more than 50% was spent on counseling the patient, review of test results, and coordination of care.      Alla Feeling, NP 02/03/18

## 2018-02-03 NOTE — Progress Notes (Signed)
Nutrition Follow-up:  Patient with adenocarcinoma of the pancreas.  Patient receiving chemotherapy.    Met with patient and sister who is in from [place].  Patient reports appetite is much better.  Sister reports it has only been better since this past Saturday.  Has had trouble with pain and constipation but that is better.  Patient reports Sunday was about to eat 2 pancakes with syrup, 2 egg omelettes with 4 strips of bacon, slice of pizza and baked chicken, few bites of mashed sweet potato. Patient has been drinking boost plus as well.     Medications: noted pain medications have been adjusted and remeron added  Labs: glucose 232  Anthropometrics:   Weight has decreased to 121 lb 6.4 oz from 129 lb on RD first visit    NUTRITION DIAGNOSIS: Malnutrition continues   MALNUTRITION DIAGNOSIS: severe malnutrition continues   INTERVENTION:  Encouraged intake of higher calorie shake to better meet nutritional needs at this time.  Would encouraged medication adjustment to better control blood glucose at this time and liberalizing diet.  Discussed with Regan Rakers, NP.   Encouraged small frequent meals.  Discussed foods high in calories and protein and fact sheet provided.   Contact information given again to patient and sister.     MONITORING, EVALUATION, GOAL: Patient will work to increase calories and protein to prevent further weight loss   NEXT VISIT: Thursday, October 31 during infusion  Omarie Parcell B. Zenia Resides, [place], [place] Registered Dietitian [phone] (pager)

## 2018-02-03 NOTE — Telephone Encounter (Signed)
Spoke with patient regarding appointment time change.  He will be here @ 1:30 for IVF and pump D/C @ 2:00 per 10/15 sch msg

## 2018-02-03 NOTE — Progress Notes (Signed)
Confirmed dose incr of Oxaliplatin to 85 mg/m2 w/ Cira Rue, NP. Kennith Center, Pharm.D., CPP 02/03/2018@11 :33 AM

## 2018-02-03 NOTE — Progress Notes (Signed)
Went to infusion to introduce myself as Arboriculturist and to offer available resources.  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 Based on income guidelines for a household of 2, patient states he is over-income but will call if anything changes and he is unable to return to work. Gave my card for any additional financial questions or concerns.

## 2018-02-04 ENCOUNTER — Telehealth: Payer: Self-pay | Admitting: Licensed Clinical Social Worker

## 2018-02-04 ENCOUNTER — Telehealth: Payer: Self-pay | Admitting: Hematology

## 2018-02-04 ENCOUNTER — Ambulatory Visit: Payer: Self-pay | Admitting: Licensed Clinical Social Worker

## 2018-02-04 ENCOUNTER — Encounter: Payer: Self-pay | Admitting: Licensed Clinical Social Worker

## 2018-02-04 DIAGNOSIS — Z808 Family history of malignant neoplasm of other organs or systems: Secondary | ICD-10-CM

## 2018-02-04 DIAGNOSIS — Z1379 Encounter for other screening for genetic and chromosomal anomalies: Secondary | ICD-10-CM

## 2018-02-04 DIAGNOSIS — C251 Malignant neoplasm of body of pancreas: Secondary | ICD-10-CM

## 2018-02-04 DIAGNOSIS — C61 Malignant neoplasm of prostate: Secondary | ICD-10-CM

## 2018-02-04 DIAGNOSIS — Z803 Family history of malignant neoplasm of breast: Secondary | ICD-10-CM

## 2018-02-04 LAB — CANCER ANTIGEN 19-9: CA 19-9: 1373 U/mL — ABNORMAL HIGH (ref 0–35)

## 2018-02-04 NOTE — Telephone Encounter (Signed)
Appts scheduled per 10/15 los

## 2018-02-04 NOTE — Telephone Encounter (Signed)
Revealed negative genetic testing.  We discussed that we do not know why he has a history of cancer or why there is cancer in the family. It could be due to a different gene that we are not testing, or something our current technology cannot pick up.  It will be important for him to keep in contact with genetics to learn if additional testing may be needed in the future.

## 2018-02-04 NOTE — Progress Notes (Signed)
HPI:  Mr. Fandrich was previously seen in the [facility] clinic on 01/22/2018 due to a personal history of pancreatic and prostate cancer and concerns regarding a hereditary predisposition to cancer. Please refer to our prior cancer genetics clinic note for more information regarding Mr. Ducre medical, social and family histories, and our assessment and recommendations, at the time. Mr. Faircloth recent genetic test results were disclosed to him, as well as recommendations warranted by these results. These results and recommendations are discussed in more detail below.  CANCER HISTORY:  Oncology History   Cancer Staging Malignant neoplasm of body of pancreas [facility]) Staging form: Exocrine Pancreas, AJCC 8th Edition - Clinical stage from 12/19/2017: Stage IIA (cT3, cN0, cM0) - Signed by Truitt Merle, MD on 01/13/2018       Malignant neoplasm of body of pancreas ([place])   12/11/2017 Imaging    CT AP WITH CONTRAST IMPRESSION: Focal area of abnormal low attenuation in the pancreatic body measuring 3.7 x 2.9 cm with cut off of the duct most concerning for primary pancreatic neoplasm.  2 small low-attenuation foci identified within the liver which are nonspecific however could represent metastatic disease; one is 1.0 cm complex lesion within the hepatic dome, the other area is a low-attenuation focus adjacent to the falciform ligament likely representing a perfusion anomaly or possible local fatty infiltration  Extensive atherosclerotic changes in the abdominal aorta, iliac vessels, splenic vessels.  Occlusion of the right and possibly left iliac.  Suspected chronic re-constitution of flow identified within the femoral vessels bilaterally.  Prostate brachytherapy consistent with history of malignancy  No evidence of small bowel obstruction.  Findings suggestive of constipation     12/19/2017 Procedure    EUS biopsy: In the body of the pancreas there was a large 20 x 30 mm, approximately,  hypoechoic mass. It had irregular margin, but it was round/oval in appearance. There was pancreatic tail atrophy, but the PD was dilated up to 7.6 mm. The celiac axis was clearly viewed and there was no evidence of invasion. In the head of the pancreas a 1 cm lymph node was identified and the pancreas was lobular in the head and uncinate process. The left adrenal was clearly identified and it was enlarged, but there was no evidence of any masses.    12/19/2017 Initial Biopsy    Diagnosis FINE NEEDLE ASPIRATION,ENDOSCOPIC, PANCREAS BODY (SPECIMEN 1 OF 1 COLLECTED 12/19/17): MALIGNANT CELLS CONSISTENT WITH ADENOCARCINOMA.    12/19/2017 Initial Diagnosis    Malignant neoplasm of body of pancreas ([place])    12/19/2017 Cancer Staging    Staging form: Exocrine Pancreas, AJCC 8th Edition - Clinical stage from 12/19/2017: Stage IIA (cT3, cN0, cM0) - Signed by Truitt Merle, MD on 01/13/2018    01/05/2018 Imaging    01/05/2018 MRI Abdomen IMPRESSION: 1. Poorly marginated hypoenhancing 4.2 x 2.5 x 5.2 cm pancreatic body mass with pancreatic tail atrophy and duct dilation, compatible with known pancreatic adenocarcinoma. Pancreatic tumor appears locally advanced with significant vascular involvement as detailed. No biliary ductal dilatation. 2. No abdominal adenopathy. 3. Two similar subcentimeter enhancing left liver lobe masses as detailed, which are too small to accurately characterize on this mildly motion degraded scan. Differential includes small metastases or hemangiomas. Suggest follow-up MRI abdomen without and with IV contrast in 3 months. These lesions are probably too small to characterize by PET-CT. 4.  Aortic Atherosclerosis (ICD10-I70.0).    01/06/2018 Imaging    01/06/2018 CT Chest IMPRESSION: 1. Two non-specific small left  lower lobe pulmonary nodules. Otherwise, no evidence of metastatic disease within the chest. 2.  Aortic Atherosclerosis (ICD10-I70.0).  Emphysema (ICD10-J43.9).     01/13/2018 Tumor Marker    Tumor Marker Results for HART, HAAS (MRN [id number]) as of 01/13/2018 09:17  Ref. Range 12/30/2017 14:59  CA 19-9 Latest Ref Range: 0 - 35 U/mL 1,263 (H)      01/20/2018 -  Chemotherapy    The patient had palonosetron (ALOXI) injection 0.25 mg, 0.25 mg, Intravenous,  Once, 2 of 4 cycles Administration: 0.25 mg (01/21/2018), 0.25 mg (02/03/2018) pegfilgrastim-cbqv (UDENYCA) injection 6 mg, 6 mg, Subcutaneous, Once, 2 of 4 cycles Administration: 6 mg (01/23/2018) irinotecan (CAMPTOSAR) 260 mg in sodium chloride 0.9 % 500 mL chemo infusion, 150 mg/m2 = 260 mg (100 % of original dose 150 mg/m2), Intravenous,  Once, 2 of 4 cycles Dose modification: 150 mg/m2 (original dose 150 mg/m2, Cycle 1, Reason: Provider Judgment) Administration: 260 mg (01/21/2018), 260 mg (02/03/2018) leucovorin 676 mg in sodium chloride 0.9 % 250 mL infusion, 400 mg/m2 = 676 mg, Intravenous,  Once, 2 of 4 cycles Administration: 676 mg (01/21/2018), 676 mg (02/03/2018) oxaliplatin (ELOXATIN) 120 mg in dextrose 5 % 500 mL chemo infusion, 70 mg/m2 = 120 mg (100 % of original dose 70 mg/m2), Intravenous,  Once, 2 of 4 cycles Dose modification: 70 mg/m2 (original dose 70 mg/m2, Cycle 1, Reason: Provider Judgment, Comment: first cycle only ) Administration: 120 mg (01/21/2018), 150 mg (02/03/2018) fluorouracil (ADRUCIL) chemo injection 700 mg, 400 mg/m2 = 700 mg, Intravenous,  Once, 2 of 4 cycles Administration: 700 mg (01/21/2018), 700 mg (02/03/2018) fluorouracil (ADRUCIL) 4,050 mg in sodium chloride 0.9 % 69 mL chemo infusion, 2,400 mg/m2 = 4,050 mg, Intravenous, 1 Day/Dose, 2 of 4 cycles Administration: 4,050 mg (01/21/2018), 4,050 mg (02/03/2018)  for chemotherapy treatment.     02/03/2018 Genetic Testing    Negative genetic testing on the [facility] Multi-Cancer Panel + Brain Cancer Panel. The Multi-Cancer Panel offered by [facility] includes sequencing and/or deletion duplication testing of the following  84 genes: AIP, ALK, APC, ATM, AXIN2,BAP1,  BARD1, BLM, BMPR1A, BRCA1, BRCA2, BRIP1, CASR, CDC73, CDH1, CDK4, CDKN1B, CDKN1C, CDKN2A (p14ARF), CDKN2A (p16INK4a), CEBPA, CHEK2, CTNNA1, DICER1, DIS3L2, EGFR (c.2369C>T, p.Thr790Met variant only), EPCAM (Deletion/duplication testing only), FH, FLCN, GATA2, GPC3, GREM1 (Promoter region deletion/duplication testing only), HOXB13 (c.251G>A, p.Gly84Glu), HRAS, KIT, MAX, MEN1, MET, MITF (c.952G>A, p.Glu318Lys variant only), MLH1, MSH2, MSH3, MSH6, MUTYH, NBN, NF1, NF2, NTHL1, PALB2, PDGFRA, PHOX2B, PMS2, POLD1, POLE, POT1, PRKAR1A, PTCH1, PTEN, RAD50, RAD51C, RAD51D, RB1, RECQL4, RET, RUNX1, SDHAF2, SDHA (sequence changes only), SDHB, SDHC, SDHD, SMAD4, SMARCA4, SMARCB1, SMARCE1, STK11, SUFU, TERC, TERT, TMEM127, TP53, TSC1, TSC2, VHL, WRN and WT1.  The CNS/Brain Cancer Panel offered by [facility] includes sequencing and/or deletion duplication testing of the following 42 genes: ALK, APC, BAP1, BARD1, CDK4, CDKN2A, DICER1, EPCAM, EZH2, GPC3, HRAS, KIF1B, MEN1, MLH1, MSH2, MSH6, NF1, NF2, PHOX2B, PMS2, POT1, PRKAR1A, PTCH2, PTEN, RB1, SMARCA4, SMARCB1, SMARCE1, SUFU, TP53, TSC1, TSC2, and VHL.   The report date is 02/03/2018.       FAMILY HISTORY:  We obtained a detailed, 4-generation family history.  Significant diagnoses are listed below: Family History  Problem Relation Age of Onset  . Diabetes Mother   . Breast cancer Mother 33  . Heart disease Father   . Brain cancer Maternal Grandfather    Mr. Ohalloran has two sons, ages 39 and 71 and a daughter age 25. He has grandchildren through his sons. Mr. Creps  has a full brother and sister, four half sister and four half brothers through his dad, and a half sister through his mother. No cancer history for any of these individuals.   Mr. Albus mother was diagnosed with breast cancer at 61, she is living at 76. She had one brother that died when he was 43 years old but Mr. Splawn does not know details. Mr. Perrott  maternal grandmother died at 95 after complications from a tooth extraction, and his maternal grandfather had brain cancer and died at 17.   Mr. Lamorte father died recently at 67. He had five sisters and a brother, no cancer history. There is also no history of cancer in Mr. Stogsdill paternal cousins. Mr. Kirschenmann paternal grandmother died at 58 and his paternal grandfather died at 38, he did have a sister (Mr. Liberto great aunt) who had bone cancer at 24 and died at 22.  Mr. Bonano is unaware of previous family history of genetic testing for hereditary cancer risks. Patient's maternal ancestors are of [place] descent, and paternal ancestors are of [place] descent. There is no reported Ashkenazi Jewish ancestry. There is no known consanguinity.  GENETIC TEST RESULTS: Genetic testing performed through [facility]'s Multi-Cancer Panel + Brain Cancer Panel reported out on 02/03/2018 showed no pathogenic mutations. The Multi-Cancer Panel offered by [facility] includes sequencing and/or deletion duplication testing of the following 84 genes: AIP, ALK, APC, ATM, AXIN2,BAP1,  BARD1, BLM, BMPR1A, BRCA1, BRCA2, BRIP1, CASR, CDC73, CDH1, CDK4, CDKN1B, CDKN1C, CDKN2A (p14ARF), CDKN2A (p16INK4a), CEBPA, CHEK2, CTNNA1, DICER1, DIS3L2, EGFR (c.2369C>T, p.Thr790Met variant only), EPCAM (Deletion/duplication testing only), FH, FLCN, GATA2, GPC3, GREM1 (Promoter region deletion/duplication testing only), HOXB13 (c.251G>A, p.Gly84Glu), HRAS, KIT, MAX, MEN1, MET, MITF (c.952G>A, p.Glu318Lys variant only), MLH1, MSH2, MSH3, MSH6, MUTYH, NBN, NF1, NF2, NTHL1, PALB2, PDGFRA, PHOX2B, PMS2, POLD1, POLE, POT1, PRKAR1A, PTCH1, PTEN, RAD50, RAD51C, RAD51D, RB1, RECQL4, RET, RUNX1, SDHAF2, SDHA (sequence changes only), SDHB, SDHC, SDHD, SMAD4, SMARCA4, SMARCB1, SMARCE1, STK11, SUFU, TERC, TERT, TMEM127, TP53, TSC1, TSC2, VHL, WRN and WT1. The CNS/Brain Cancer Panel offered by [facility] includes sequencing and/or deletion duplication  testing of the following 42 genes: ALK, APC, BAP1, BARD1, CDK4, CDKN2A, DICER1, EPCAM, EZH2, GPC3, HRAS, KIF1B, MEN1, MLH1, MSH2, MSH6, NF1, NF2, PHOX2B, PMS2, POT1, PRKAR1A, PTCH2, PTEN, RB1, SMARCA4, SMARCB1, SMARCE1, SUFU, TP53, TSC1, TSC2, and VHL.  The test report will be scanned into EPIC and will be located under the Molecular Pathology section of the Results Review tab. A portion of the result report is included below for reference.    We discussed with Mr. Herbers that because current genetic testing is not perfect, it is possible there may be a gene mutation in one of these genes that current testing cannot detect, but that chance is small.  We also discussed, that there could be another gene that has not yet been discovered, or that we have not yet tested, that is responsible for the cancer diagnoses in the family. It is also possible there is a hereditary cause for the cancer in the family that Mr. Deeg did not inherit and therefore was not identified in his testing.  Therefore, it is important to remain in touch with cancer genetics in the future so that we can continue to offer Mr. Orgeron the most up to date genetic testing.   ADDITIONAL GENETIC TESTING: We discussed with Mr. Raper that his genetic testing was fairly extensive.  If there are are genes identified to increase cancer risk that can be analyzed in  the future, we would be happy to discuss and coordinate this testing at that time.    CANCER SCREENING RECOMMENDATIONS: Mr. Brickle test result is considered negative (normal).  This means that we have not identified a hereditary cause for his personal and family history of cancer at this time.   While reassuring, this does not definitively rule out a hereditary predisposition to cancer. It is still possible that there could be genetic mutations that are undetectable by current technology, or genetic mutations in genes that have not been tested or identified to increase cancer risk.   Therefore, it is recommended he continue to follow the cancer management and screening guidelines provided by his oncology and primary healthcare provider. An individual's cancer risk is not determined by genetic test results alone.  Overall cancer risk assessment includes additional factors such as personal medical history, family history, etc.  These should be used to make a personalized plan for cancer prevention and surveillance.    RECOMMENDATIONS FOR FAMILY MEMBERS:  Relatives in this family might be at some increased risk of developing cancer, over the general population risk, simply due to the family history of cancer.  We recommended women in this family have a yearly mammogram beginning at age 66, or 94 years younger than the earliest onset of cancer, an annual clinical breast exam, and perform monthly breast self-exams. Women in this family should also have a gynecological exam as recommended by their primary provider. All family members should have a colonoscopy by age 66 (or as directed by their doctors).  All family members should inform their physicians about the family history of cancer so their doctors can make the most appropriate screening recommendations for them.   FOLLOW-UP: Lastly, we discussed with Mr. Patrone that cancer genetics is a rapidly advancing field and it is possible that new genetic tests will be appropriate for him and/or his family members in the future. We encouraged him to remain in contact with cancer genetics on an annual basis so we can update his personal and family histories and let him know of advances in cancer genetics that may benefit this family.   Our contact number was provided. Mr. Sagar questions were answered to his satisfaction, and he knows he is welcome to call us at anytime with additional questions or concerns.  Faith Rogue, MS Genetic Counselor [email or web address] Phone: [phone]

## 2018-02-05 ENCOUNTER — Inpatient Hospital Stay: Payer: Medicare Other

## 2018-02-05 VITALS — BP 142/55 | HR 63 | Temp 98.6°F | Resp 18

## 2018-02-05 DIAGNOSIS — Z95828 Presence of other vascular implants and grafts: Secondary | ICD-10-CM

## 2018-02-05 DIAGNOSIS — C251 Malignant neoplasm of body of pancreas: Secondary | ICD-10-CM | POA: Diagnosis not present

## 2018-02-05 MED ORDER — SODIUM CHLORIDE 0.9% FLUSH
10.0000 mL | INTRAVENOUS | Status: DC | PRN
  Administered 2018-02-05: 10 mL
  Filled 2018-02-05: qty 10

## 2018-02-05 MED ORDER — SODIUM CHLORIDE 0.9 % IV SOLN
Freq: Once | INTRAVENOUS | Status: AC
  Administered 2018-02-05: 14:00:00 via INTRAVENOUS
  Filled 2018-02-05: qty 250

## 2018-02-05 MED ORDER — PEGFILGRASTIM-CBQV 6 MG/0.6ML ~~LOC~~ SOSY
6.0000 mg | PREFILLED_SYRINGE | Freq: Once | SUBCUTANEOUS | Status: AC
  Administered 2018-02-05: 6 mg via SUBCUTANEOUS

## 2018-02-05 MED ORDER — PEGFILGRASTIM-CBQV 6 MG/0.6ML ~~LOC~~ SOSY
PREFILLED_SYRINGE | SUBCUTANEOUS | Status: AC
  Filled 2018-02-05: qty 0.6

## 2018-02-05 MED ORDER — HEPARIN SOD (PORK) LOCK FLUSH 100 UNIT/ML IV SOLN
500.0000 [IU] | Freq: Once | INTRAVENOUS | Status: AC | PRN
  Administered 2018-02-05: 500 [IU]
  Filled 2018-02-05: qty 5

## 2018-02-05 NOTE — Progress Notes (Signed)
Pt tolerated infusion well, No further problems or concerns noted.

## 2018-02-05 NOTE — Patient Instructions (Signed)
Pegfilgrastim injection(Udenyca) What is this medicine? PEGFILGRASTIM (PEG fil gra stim) is a long-acting granulocyte colony-stimulating factor that stimulates the growth of neutrophils, a type of white blood cell important in the body's fight against infection. It is used to reduce the incidence of fever and infection in patients with certain types of cancer who are receiving chemotherapy that affects the bone marrow, and to increase survival after being exposed to high doses of radiation. This medicine may be used for other purposes; ask your health care provider or pharmacist if you have questions. COMMON BRAND NAME(S): Neulasta, Udenyca Dehydration, Adult Dehydration is when there is not enough fluid or water in your body. This happens when you lose more fluids than you take in. Dehydration can range from mild to very bad. It should be treated right away to keep it from getting very bad. Symptoms of mild dehydration may include:  Thirst.  Dry lips.  Slightly dry mouth.  Dry, warm skin.  Dizziness. Symptoms of moderate dehydration may include:  Very dry mouth.  Muscle cramps.  Dark pee (urine). Pee may be the color of tea.  Your body making less pee.  Your eyes making fewer tears.  Heartbeat that is uneven or faster than normal (palpitations).  Headache.  Light-headedness, especially when you stand up from sitting.  Fainting (syncope). Symptoms of very bad dehydration may include:  Changes in skin, such as: ? Cold and clammy skin. ? Blotchy (mottled) or pale skin. ? Skin that does not quickly return to normal after being lightly pinched and let go (poor skin turgor).  Changes in body fluids, such as: ? Feeling very thirsty. ? Your eyes making fewer tears. ? Not sweating when body temperature is high, such as in hot weather. ? Your body making very little pee.  Changes in vital signs, such as: ? Weak pulse. ? Pulse that is more than 100 beats a minute when you  are sitting still. ? Fast breathing. ? Low blood pressure.  Other changes, such as: ? Sunken eyes. ? Cold hands and feet. ? Confusion. ? Lack of energy (lethargy). ? Trouble waking up from sleep. ? Short-term weight loss. ? Unconsciousness. Follow these instructions at home:  If told by your doctor, drink an ORS: ? Make an ORS by using instructions on the package. ? Start by drinking small amounts, about  cup (120 mL) every 5-10 minutes. ? Slowly drink more until you have had the amount that your doctor said to have.  Drink enough clear fluid to keep your pee clear or pale yellow. If you were told to drink an ORS, finish the ORS first, then start slowly drinking clear fluids. Drink fluids such as: ? Water. Do not drink only water by itself. Doing that can make the salt (sodium) level in your body get too low (hyponatremia). ? Ice chips. ? Fruit juice that you have added water to (diluted). ? Low-calorie sports drinks.  Avoid: ? Alcohol. ? Drinks that have a lot of sugar. These include high-calorie sports drinks, fruit juice that does not have water added, and soda. ? Caffeine. ? Foods that are greasy or have a lot of fat or sugar.  Take over-the-counter and prescription medicines only as told by your doctor.  Do not take salt tablets. Doing that can make the salt level in your body get too high (hypernatremia).  Eat foods that have minerals (electrolytes). Examples include bananas, oranges, potatoes, tomatoes, and spinach.  Keep all follow-up visits as told by your   doctor. This is important. Contact a doctor if:  You have belly (abdominal) pain that: ? Gets worse. ? Stays in one area (localizes).  You have a rash.  You have a stiff neck.  You get angry or annoyed more easily than normal (irritability).  You are more sleepy than normal.  You have a harder time waking up than normal.  You feel: ? Weak. ? Dizzy. ? Very thirsty.  You have peed (urinated) only a  small amount of very dark pee during 6-8 hours. Get help right away if:  You have symptoms of very bad dehydration.  You cannot drink fluids without throwing up (vomiting).  Your symptoms get worse with treatment.  You have a fever.  You have a very bad headache.  You are throwing up or having watery poop (diarrhea) and it: ? Gets worse. ? Does not go away.  You have blood or something green (bile) in your throw-up.  You have blood in your poop (stool). This may cause poop to look black and tarry.  You have not peed in 6-8 hours.  You pass out (faint).  Your heart rate when you are sitting still is more than 100 beats a minute.  You have trouble breathing. This information is not intended to replace advice given to you by your health care provider. Make sure you discuss any questions you have with your health care provider. Document Released: 02/02/2009 Document Revised: 10/27/2015 Document Reviewed: 06/02/2015 Elsevier Interactive Patient Education  2018 Elsevier Inc.  What should I tell my health care provider before I take this medicine? They need to know if you have any of these conditions: -kidney disease -latex allergy -ongoing radiation therapy -sickle cell disease -skin reactions to acrylic adhesives (On-Body Injector only) -an unusual or allergic reaction to pegfilgrastim, filgrastim, other medicines, foods, dyes, or preservatives -pregnant or trying to get pregnant -breast-feeding How should I use this medicine? This medicine is for injection under the skin. If you get this medicine at home, you will be taught how to prepare and give the pre-filled syringe or how to use the On-body Injector. Refer to the patient Instructions for Use for detailed instructions. Use exactly as directed. Tell your healthcare provider immediately if you suspect that the On-body Injector may not have performed as intended or if you suspect the use of the On-body Injector resulted in a  missed or partial dose. It is important that you put your used needles and syringes in a special sharps container. Do not put them in a trash can. If you do not have a sharps container, call your pharmacist or healthcare provider to get one. Talk to your pediatrician regarding the use of this medicine in children. While this drug may be prescribed for selected conditions, precautions do apply. Overdosage: If you think you have taken too much of this medicine contact a poison control center or emergency room at once. NOTE: This medicine is only for you. Do not share this medicine with others. What if I miss a dose? It is important not to miss your dose. Call your doctor or health care professional if you miss your dose. If you miss a dose due to an On-body Injector failure or leakage, a new dose should be administered as soon as possible using a single prefilled syringe for manual use. What may interact with this medicine? Interactions have not been studied. Give your health care provider a list of all the medicines, herbs, non-prescription drugs, or dietary supplements you   use. Also tell them if you smoke, drink alcohol, or use illegal drugs. Some items may interact with your medicine. This list may not describe all possible interactions. Give your health care provider a list of all the medicines, herbs, non-prescription drugs, or dietary supplements you use. Also tell them if you smoke, drink alcohol, or use illegal drugs. Some items may interact with your medicine. What should I watch for while using this medicine? You may need blood work done while you are taking this medicine. If you are going to need a MRI, CT scan, or other procedure, tell your doctor that you are using this medicine (On-Body Injector only). What side effects may I notice from receiving this medicine? Side effects that you should report to your doctor or health care professional as soon as possible: -allergic reactions like  skin rash, itching or hives, swelling of the face, lips, or tongue -dizziness -fever -pain, redness, or irritation at site where injected -pinpoint red spots on the skin -red or dark-brown urine -shortness of breath or breathing problems -stomach or side pain, or pain at the shoulder -swelling -tiredness -trouble passing urine or change in the amount of urine Side effects that usually do not require medical attention (report to your doctor or health care professional if they continue or are bothersome): -bone pain -muscle pain This list may not describe all possible side effects. Call your doctor for medical advice about side effects. You may report side effects to FDA at 1-800-FDA-1088. Where should I keep my medicine? Keep out of the reach of children. Store pre-filled syringes in a refrigerator between 2 and 8 degrees C (36 and 46 degrees F). Do not freeze. Keep in carton to protect from light. Throw away this medicine if it is left out of the refrigerator for more than 48 hours. Throw away any unused medicine after the expiration date. NOTE: This sheet is a summary. It may not cover all possible information. If you have questions about this medicine, talk to your doctor, pharmacist, or health care provider.  2018 Elsevier/Gold Standard (2016-04-04 12:58:03)  

## 2018-02-11 ENCOUNTER — Other Ambulatory Visit: Payer: Self-pay | Admitting: Cardiovascular Disease

## 2018-02-11 DIAGNOSIS — I739 Peripheral vascular disease, unspecified: Secondary | ICD-10-CM

## 2018-02-12 ENCOUNTER — Other Ambulatory Visit: Payer: Self-pay | Admitting: Medical

## 2018-02-12 ENCOUNTER — Telehealth: Payer: Self-pay | Admitting: Medical

## 2018-02-12 ENCOUNTER — Other Ambulatory Visit: Payer: Self-pay | Admitting: Nurse Practitioner

## 2018-02-12 ENCOUNTER — Telehealth: Payer: Self-pay | Admitting: Nurse Practitioner

## 2018-02-12 DIAGNOSIS — C251 Malignant neoplasm of body of pancreas: Secondary | ICD-10-CM

## 2018-02-12 DIAGNOSIS — K5909 Other constipation: Secondary | ICD-10-CM

## 2018-02-12 NOTE — Telephone Encounter (Signed)
Received a call from Pt.'s sister Jewel Powers in reference to Pt's symptoms. TC to Mr Yono, Pt. Stated that he has not had a bowel movement since Sunday and has taken senokot, prune juice, colace, miralax, suppositories, and magnesium citrate and still has no relief. Cira Rue NP informed,Per Lacie Pt. Will see Sandi Mealy PA tomorrow. Appointment confirmed with Pt.

## 2018-02-12 NOTE — Telephone Encounter (Signed)
Pt sched per 10/24 sch message. Pt aware

## 2018-02-13 ENCOUNTER — Inpatient Hospital Stay (HOSPITAL_BASED_OUTPATIENT_CLINIC_OR_DEPARTMENT_OTHER): Payer: Medicare Other | Admitting: Medical

## 2018-02-13 ENCOUNTER — Ambulatory Visit (HOSPITAL_COMMUNITY)
Admission: RE | Admit: 2018-02-13 | Discharge: 2018-02-13 | Disposition: A | Discharge status: Home / Self Care | Payer: Medicare Other | Source: Ambulatory Visit | Attending: Nurse Practitioner | Admitting: Nurse Practitioner

## 2018-02-13 ENCOUNTER — Inpatient Hospital Stay: Payer: Medicare Other

## 2018-02-13 VITALS — BP 131/65 | HR 86 | Temp 97.9°F | Resp 18 | Ht 71.0 in | Wt 120.6 lb

## 2018-02-13 DIAGNOSIS — Z87891 Personal history of nicotine dependence: Secondary | ICD-10-CM

## 2018-02-13 DIAGNOSIS — K5909 Other constipation: Secondary | ICD-10-CM | POA: Diagnosis not present

## 2018-02-13 DIAGNOSIS — K5900 Constipation, unspecified: Secondary | ICD-10-CM

## 2018-02-13 DIAGNOSIS — C251 Malignant neoplasm of body of pancreas: Secondary | ICD-10-CM | POA: Diagnosis not present

## 2018-02-13 LAB — CMP (CANCER CENTER ONLY)
ALK PHOS: 65 U/L (ref 38–126)
ALT: 22 U/L (ref 0–44)
AST: 13 U/L — AB (ref 15–41)
Albumin: 3.9 g/dL (ref 3.5–5.0)
Anion gap: 9 (ref 5–15)
BILIRUBIN TOTAL: 0.4 mg/dL (ref 0.3–1.2)
BUN: 12 mg/dL (ref 8–23)
CALCIUM: 9.3 mg/dL (ref 8.9–10.3)
CO2: 28 mmol/L (ref 22–32)
Chloride: 102 mmol/L (ref 98–111)
Creatinine: 0.91 mg/dL (ref 0.61–1.24)
GFR, Est AFR Am: >60 mL/min (ref >60)
GFR, Estimated: >60 mL/min (ref >60)
Glucose, Bld: 213 mg/dL — ABNORMAL HIGH (ref 70–99)
POTASSIUM: 4.3 mmol/L (ref 3.5–5.1)
Sodium: 139 mmol/L (ref 135–145)
TOTAL PROTEIN: 6.7 g/dL (ref 6.5–8.1)

## 2018-02-13 LAB — CBC WITH DIFFERENTIAL (CANCER CENTER ONLY)
Abs Immature Granulocytes: 0.37 10*3/uL — ABNORMAL HIGH (ref 0.00–0.07)
BASOS PCT: 0 %
Basophils Absolute: 0 10*3/uL (ref 0.0–0.1)
EOS ABS: 0 10*3/uL (ref 0.0–0.5)
EOS PCT: 0 %
HEMATOCRIT: 33.6 % — AB (ref 39.0–52.0)
HEMOGLOBIN: 10.8 g/dL — AB (ref 13.0–17.0)
Immature Granulocytes: 5 %
Lymphocytes Relative: 17 %
Lymphs Abs: 1.3 10*3/uL (ref 0.7–4.0)
MCH: 30 pg (ref 26.0–34.0)
MCHC: 32.1 g/dL (ref 30.0–36.0)
MCV: 93.3 fL (ref 80.0–100.0)
MONO ABS: 0.8 10*3/uL (ref 0.1–1.0)
Monocytes Relative: 10 %
NEUTROS ABS: 5.2 10*3/uL (ref 1.7–7.7)
Neutrophils Relative %: 68 %
Platelet Count: 181 10*3/uL (ref 150–400)
RBC: 3.6 MIL/uL — AB (ref 4.22–5.81)
RDW: 14.4 % (ref 11.5–15.5)
WBC: 7.7 10*3/uL (ref 4.0–10.5)
nRBC: 0 % (ref 0.0–0.2)

## 2018-02-13 MED ORDER — SODIUM CHLORIDE 0.9% FLUSH
10.0000 mL | Freq: Once | INTRAVENOUS | Status: AC
  Filled 2018-02-13: qty 10

## 2018-02-13 NOTE — Patient Instructions (Signed)

## 2018-02-13 NOTE — Progress Notes (Signed)
[facility] Progress Note   Michael Maddox [id number] 14-Aug-1950 67 y.o.  Michael Maddox is managed by Dr. Truitt Merle  Actively treated with chemotherapy/immunotherapy: yes  Current Therapy: FOLFIRINOX  Last Treated: 02/03/2018 (cycle 2)  Assessment: Plan:    Constipation, unspecified constipation type  Malignant neoplasm of body of pancreas ([place])   Constipation: Patient was referred for a KUB which returned showing: Large stool burden is noted.  No abnormal bowel dilatation is noted.  Michael Maddox was given the following information regarding management of constipation.  Magnesium Citrate, drink 1/2 bottle, drink remainder if no bowel movement with 30 to 60 minutes  Or  30 mg (1 tablespoon) of Milk of Magnesia in 8 ounces of prune juice, warm in microwave for 20 seconds    Begin the following after you have had a bowel movement:  Senna-S, 1 to 2 tablets twice daily  MiraLAX 17 grams in 8 ounces of liquids 1 to 2 times daily as needed   Remember to remain well hydrated. Drink, Drink, Drink non-caffeinated beverages.   Adjust these medications based on your response. If your bowel movements become too loose then decrease the amount of Senna-S and/or MiraLAX that you are using. If your bowel movements become too firm or are difficult to pass, the increase the amount of Senna-S and/or MiraLAX that you are using and increase your intake of water.  Pancreatic cancer: Michael Maddox is status post cycle 2 of FOLFIRINOX which was last dosed on 02/03/2018.  He is scheduled to follow-up with Dr. Burr Medico on 02/19/2018.  Please see After Visit Summary for patient specific instructions.  Future Appointments  Date Time Provider [place]  02/21/2018  1:00 PM [place] FLUSH [facility] None  03/05/2018 10:00 AM [facility] LAB 6 [facility] None  03/05/2018 10:15 AM [facility] [place] FLUSH [facility] None  03/05/2018 10:45 AM Truitt Merle, MD [facility] None    03/05/2018 11:30 AM [facility] INFUSION [facility] None  03/07/2018  2:00 PM [facility] [place] FL[facility] None    No orders of the defined types were placed in this encounter.      Subjective:   Patient ID:  Michael Maddox is a 67 y.o. (DOB April 12, 1951) male.  Chief Complaint:  Chief Complaint  Patient presents with  . Constipation    HPI Michael Maddox is a [id number] y.o.male with history of prostate cancer who is managed by Dr. Truitt Merle.  He is status post cycle 2 of FOLFIRINOX on 02/03/2018.  He presents to the office today stating that he has not had a normal bowel movement since Sunday.  He has tried senna, MiraLAX, prune juice, magnesium citrate, and a fleets enema.  He was able to have one small bowel movement on Wednesday.  He denies nausea, vomiting, bright red blood per rectum, melena, fevers, chills, or sweats.  He has a history of hemorrhoids.  Medications: I have reviewed the patient's current medications.  Allergies: No Known Allergies  Past Medical History:  Diagnosis Date  . Arthritis   . Carotid artery disease ([place]) followed by dr berry--- per last duplex [id number]  LICA 89-21%, bilateral ECA >50%,  patent RICA post intervention   hx acute high grade stenosis RICA >80% 02-22-2009 in setting of  stroke  s/p  right carotid endarterectomy 02-28-2009/    . Claudication of both lower extremities ([place])    due to PAD ----  right > left  . COPD (chronic obstructive pulmonary disease) ([place])   .  Coronary artery disease CARDIOLOGIST-  DR BERRY   hx positive myoview 07-29-2013;  08-16-2013 per cardiac cath, severe 3V CAD w/ 60% subclavian stenosis;  08-23-2013   s/p  CABG x3  (LIMA to LAD, SVG to OM1, SVG to PDA)  . CVA (cerebral vascular accident) ([place]) 02/22/2009   acute infarct right hemisphere w/ severe RICA stentosis----  per pt no residuals  . Family history of brain cancer   . Family history of breast cancer   . GERD (gastroesophageal reflux disease)   .  HTN (hypertension)   . Hyperlipidemia   . Hyperplasia of prostate with lower urinary tract symptoms (LUTS)   . Peripheral arterial disease ([place]) followed by dr berry--- last dobbler ABIs bilateral SFA occlusions (pt is scheduled for intervention 05-08-2017   hx PTA and stenting to bilateral SFA 2002 and PTA w/ stenting left common iliac artery and for in-stent restenosis 2003 Left SFA and 2005  stenting right SFA for in-stent restenosis  . Prostate cancer [facility]) urologist-  dr ottelin/  oncologist-  dr Tammi Klippel   dx 11-20-2016-- Stage T1c, Gleason 4+3, PSA 5.73, vol 35.95cc--- scheduled for radioactive seed implants 04-11-2017  . 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 S/P peripheral artery angioplasty with stent placement    bilateral SFA 2002;  in-stenosis bilateral SFA  (right 2003, left 2005) and left CIA 2003  . Shortness of breath   . Type II diabetes mellitus ([place])   . Weak urinary stream   . Wears glasses     Past Surgical History:  Procedure Laterality Date  . ABDOMINAL AORTOGRAM N/A 05/08/2017   Procedure: ABDOMINAL AORTOGRAM;  Surgeon: Lorretta Harp, MD;  Location: [facility] [facility];  Service: Cardiovascular;  Laterality: N/A;  . CARDIAC CATHETERIZATION  12-01-2000    dr berry    non-critial CAD & nonischemic cardiolite-- mLAD 50%, mLCx 70%, prox. to mid RCA 75% focal midsegment 80-85%  . CARDIOVASCULAR STRESS TEST  07-29-2013    dr berry   High risk nuclear study w/ inferoseptal ischemia/  normal LV function and wall motion,  nuclear study ef 52%  . CAROTID ENDARTERECTOMY Right 02-28-2009    dr c. Scot Dock  [facility]   patch angioplasty  . COLONOSCOPY    . CORONARY ARTERY BYPASS GRAFT N/A 08/23/2013   Procedure: CORONARY ARTERY BYPASS GRAFTING (CABG);  Surgeon: Melrose Nakayama, MD;  Location: [facility];  Service: Open Heart Surgery;  Laterality: N/A;  Coronary Artery Bypass graft times three using left internal mammary artery and left leg saphenous vein     . CYSTOSCOPY N/A 04/11/2017   Procedure: CYSTOSCOPY FLEXIBLE;  Surgeon: Kathie Rhodes, MD;  Location: [facility];  Service: Urology;  Laterality: N/A;  no seeds found in bladder  . ENDOVASCULAR STENT INSERTION Bilateral right 12-25-2000 ;  left 03-02-2001   dr berry   PTA and stenting to bilateral SFA  . ENDOVASCULAR STENT INSERTION Bilateral right 08-30-2003 ;  left 11-10-2001    dr berry   PTA and stenting for in-stent restenosis right SFA 08-30-2003:  PTA and stenting distal left common iliac artery and in-stent restenosis left SFA 11-10-2001  . FINE NEEDLE ASPIRATION N/A 12/19/2017   Procedure: FINE NEEDLE ASPIRATION (FNA) LINEAR;  Surgeon: Carol Ada, MD;  Location: [facility];  Service: Endoscopy;  Laterality: N/A;  . INTRAOPERATIVE TRANSESOPHAGEAL ECHOCARDIOGRAM N/A 08/23/2013   Procedure: INTRAOPERATIVE TRANSESOPHAGEAL ECHOCARDIOGRAM;  Surgeon: Melrose Nakayama, MD;  Location: [facility];  Service: Open Heart Surgery;  Laterality: N/A;  . IR IMAGING GUIDED PORT INSERTION  01/20/2018  . LEFT HEART CATHETERIZATION WITH CORONARY ANGIOGRAM N/A 08/16/2013   Procedure: LEFT HEART CATHETERIZATION WITH CORONARY ANGIOGRAM;  Surgeon: Lorretta Harp, MD;  Location: [facility] CATH LAB;  Service: Cardiovascular;  Laterality: N/A;   severe 3V CAD and 60% subclavian sternosis  . LOWER EXTREMITY ANGIOGRAM N/A 08/16/2013   Procedure: LOWER EXTREMITY ANGIOGRAM;  Surgeon: Lorretta Harp, MD;  Location: [facility] CATH LAB;  Service: Cardiovascular;  Laterality: N/A;  . LOWER EXTREMITY ANGIOGRAPHY Bilateral 05/08/2017   Procedure: Lower Extremity Angiography;  Surgeon: Lorretta Harp, MD;  Location: [place] [facility];  Service: Cardiovascular;  Laterality: Bilateral;  . PERIPHERAL VASCULAR BALLOON ANGIOPLASTY Left 05/08/2017   Procedure: PERIPHERAL VASCULAR BALLOON ANGIOPLASTY;  Surgeon: Lorretta Harp, MD;  Location: [place] [facility];  Service: Cardiovascular;  Laterality: Left;  COMMON ILIAC  .  RADIOACTIVE SEED IMPLANT N/A 04/11/2017   Procedure: RADIOACTIVE SEED IMPLANT/BRACHYTHERAPY IMPLANT;  Surgeon: Kathie Rhodes, MD;  Location: [facility];  Service: Urology;  Laterality: N/A;    67   seeds implanted  . SPACE OAR INSTILLATION N/A 04/11/2017   Procedure: SPACE OAR INSTILLATION;  Surgeon: Kathie Rhodes, MD;  Location: [facility];  Service: Urology;  Laterality: N/A;  . TRANSTHORACIC ECHOCARDIOGRAM  07/15/2013   ef 16-10%, grade 1 diastolic dysfunction/  trivial MR, mild to moderate PR, mild TR   . UPPER ESOPHAGEAL ENDOSCOPIC ULTRASOUND (EUS) N/A 12/19/2017   Procedure: UPPER ESOPHAGEAL ENDOSCOPIC ULTRASOUND (EUS);  Surgeon: Carol Ada, MD;  Location: [facility];  Service: Endoscopy;  Laterality: N/A;    Family History  Problem Relation Age of Onset  . Diabetes Mother   . Breast cancer Mother 74  . Heart disease Father   . Brain cancer Maternal Grandfather     Social History   Socioeconomic History  . Marital status: Married    Spouse name: Not on file  . Number of children: 3  . Years of education: Not on file  . Highest education level: Not on file  Occupational History  . Not on file  Social Needs  . Financial resource strain: Not on file  . Food insecurity:    Worry: Not on file    Inability: Not on file  . Transportation needs:    Medical: Not on file    Non-medical: Not on file  Tobacco Use  . Smoking status: Former Smoker    Packs/day: 1.00    Years: 35.00    Pack years: 35.00    Types: Cigarettes    Last attempt to quit: 05/04/2013    Years since quitting: 4.8  . Smokeless tobacco: Never Used  Substance and Sexual Activity  . Alcohol use: Yes    Alcohol/week: 4.0 standard drinks    Types: 4 Standard drinks or equivalent per week    Comment: none in 2 months, previously 1-2 drinks per week   . Drug use: No  . Sexual activity: Not on file  Lifestyle  . Physical activity:    Days per week: Not on file    Minutes  per session: Not on file  . Stress: Not on file  Relationships  . Social connections:    Talks on phone: Not on file    Gets together: Not on file    Attends religious service: Not on file    Active member of club or organization: Not  on file    Attends meetings of clubs or organizations: Not on file    Relationship status: Not on file  . Intimate partner violence:    Fear of current or ex partner: Not on file    Emotionally abused: Not on file    Physically abused: Not on file    Forced sexual activity: Not on file  Other Topics Concern  . Not on file  Social History Narrative  . Not on file    Past Medical History, Surgical history, Social history, and Family history were reviewed and updated as appropriate.   Please see review of systems for further details on the patient's review from today.   Review of Systems:  Review of Systems  Constitutional: Negative for appetite change, chills, diaphoresis, fever and unexpected weight change.  HENT: Negative for trouble swallowing.   Respiratory: Negative for cough, choking, shortness of breath and wheezing.   Cardiovascular: Negative for chest pain and palpitations.  Gastrointestinal: Positive for constipation. Negative for abdominal distention, abdominal pain, anal bleeding, blood in stool, diarrhea, nausea, rectal pain and vomiting.  Genitourinary: Negative for decreased urine volume.  Neurological: Negative for headaches.    Objective:   Physical Exam:  BP 131/65 (BP Location: Right Arm, Patient Position: Sitting)   Pulse 86   Temp 97.9 F (36.6 C) (Oral)   Resp 18   Ht 5\' 11"  (1.803 m)   Wt 120 lb 9.6 oz (54.7 kg)   SpO2 100%   BMI 16.82 kg/m  ECOG: 0  Physical Exam  Constitutional: No distress.  HENT:  Head: Normocephalic and atraumatic.  Mouth/Throat: Oropharynx is clear and moist.  Cardiovascular: Normal rate, regular rhythm and normal heart sounds. Exam reveals no gallop and no friction rub.  No murmur  heard. Pulmonary/Chest: Effort normal and breath sounds normal. No respiratory distress. He has no wheezes. He has no rales.  Abdominal: Soft. Bowel sounds are normal. He exhibits no distension and no mass. There is no tenderness. There is no rebound and no guarding.  Musculoskeletal: He exhibits no edema.  Neurological: He is alert.  Skin: Skin is warm and dry. He is not diaphoretic.    Lab Review:     Component Value Date/Time   NA 136 02/19/2018 0759   K 4.2 02/19/2018 0759   CL 99 02/19/2018 0759   CO2 24 02/19/2018 0759   GLUCOSE 303 (H) 02/19/2018 0759   BUN 15 02/19/2018 0759   CREATININE 1.01 02/19/2018 0759   CREATININE 1.03 08/10/2013 0701   CALCIUM 9.6 02/19/2018 0759   PROT 6.9 02/19/2018 0759   ALBUMIN 4.0 02/19/2018 0759   AST 12 (L) 02/19/2018 0759   ALT 15 02/19/2018 0759   ALKPHOS 79 02/19/2018 0759   BILITOT 0.4 02/19/2018 0759   GFRNONAA >60 02/19/2018 0759   GFRAA >60 02/19/2018 0759       Component Value Date/Time   WBC 7.9 02/19/2018 0759   WBC 4.3 01/20/2018 1358   RBC 3.76 (L) 02/19/2018 0759   HGB 11.5 (L) 02/19/2018 0759   HCT 35.3 (L) 02/19/2018 0759   PLT 233 02/19/2018 0759   MCV 93.9 02/19/2018 0759   MCH 30.6 02/19/2018 0759   MCHC 32.6 02/19/2018 0759   RDW 15.6 (H) 02/19/2018 0759   LYMPHSABS 1.5 02/19/2018 0759   MONOABS 0.7 02/19/2018 0759   EOSABS 0.0 02/19/2018 0759   BASOSABS 0.0 02/19/2018 0759   -------------------------------  Imaging from last 24 hours (if applicable):  Radiology interpretation: Dg Abd  1 View  Result Date: 02/13/2018 CLINICAL DATA:  Constipation, abdominal pain. EXAM: ABDOMEN - 1 VIEW COMPARISON:  Radiograph of November 21, 2017. FINDINGS: No abnormal bowel dilatation is noted. Large amount of stool seen throughout the colon. Status post brachytherapy seed placement of prostate. No radio-opaque calculi or other significant radiographic abnormality are seen. IMPRESSION: Large stool burden is noted.  No  abnormal bowel dilatation is noted. Electronically Signed   By: Marijo Conception, M.D.   On: 02/13/2018 14:29      Constipation Management  Magnesium Citrate, drink 1/2 bottle, drink remainder if no bowel movement with 30 to 60 minutes  Or  30 mg (1 tablespoon) of Milk of Magnesia in 8 ounces of prune juice, warm in microwave for 20 seconds    Begin the following after you have had a bowel movement:  Senna-S, 1 to 2 tablets twice daily  MiraLAX 17 grams in 8 ounces of liquids 1 to 2 times daily as needed   Remember to remain well hydrated. Drink, Drink, Drink non-caffeinated beverages.   Adjust these medications based on your response. If your bowel movements become too loose then decrease the amount of Senna-S and/or MiraLAX that you are using. If your bowel movements become too firm or are difficult to pass, the increase the amount of Senna-S and/or MiraLAX that you are using and increase your intake of water.

## 2018-02-19 ENCOUNTER — Inpatient Hospital Stay: Payer: Medicare Other

## 2018-02-19 ENCOUNTER — Inpatient Hospital Stay: Payer: Medicare Other | Admitting: Hematology

## 2018-02-19 ENCOUNTER — Inpatient Hospital Stay: Payer: Medicare Other | Admitting: Nutrition

## 2018-02-19 ENCOUNTER — Encounter: Payer: Self-pay | Admitting: Hematology

## 2018-02-19 VITALS — BP 132/71 | HR 81 | Temp 98.7°F | Resp 18 | Ht 71.0 in | Wt 122.4 lb

## 2018-02-19 DIAGNOSIS — I10 Essential (primary) hypertension: Secondary | ICD-10-CM | POA: Diagnosis not present

## 2018-02-19 DIAGNOSIS — C251 Malignant neoplasm of body of pancreas: Secondary | ICD-10-CM

## 2018-02-19 DIAGNOSIS — F419 Anxiety disorder, unspecified: Secondary | ICD-10-CM

## 2018-02-19 DIAGNOSIS — E119 Type 2 diabetes mellitus without complications: Secondary | ICD-10-CM

## 2018-02-19 DIAGNOSIS — R109 Unspecified abdominal pain: Secondary | ICD-10-CM

## 2018-02-19 DIAGNOSIS — K5909 Other constipation: Secondary | ICD-10-CM | POA: Diagnosis not present

## 2018-02-19 DIAGNOSIS — J449 Chronic obstructive pulmonary disease, unspecified: Secondary | ICD-10-CM

## 2018-02-19 DIAGNOSIS — C61 Malignant neoplasm of prostate: Secondary | ICD-10-CM

## 2018-02-19 DIAGNOSIS — E1165 Type 2 diabetes mellitus with hyperglycemia: Secondary | ICD-10-CM | POA: Diagnosis not present

## 2018-02-19 DIAGNOSIS — R634 Abnormal weight loss: Secondary | ICD-10-CM

## 2018-02-19 DIAGNOSIS — Z95828 Presence of other vascular implants and grafts: Secondary | ICD-10-CM

## 2018-02-19 DIAGNOSIS — F329 Major depressive disorder, single episode, unspecified: Secondary | ICD-10-CM

## 2018-02-19 LAB — CBC WITH DIFFERENTIAL (CANCER CENTER ONLY)
Abs Immature Granulocytes: 0.07 10*3/uL (ref 0.00–0.07)
BASOS PCT: 0 %
Basophils Absolute: 0 10*3/uL (ref 0.0–0.1)
EOS ABS: 0 10*3/uL (ref 0.0–0.5)
Eosinophils Relative: 1 %
HCT: 35.3 % — ABNORMAL LOW (ref 39.0–52.0)
Hemoglobin: 11.5 g/dL — ABNORMAL LOW (ref 13.0–17.0)
Immature Granulocytes: 1 %
Lymphocytes Relative: 19 %
Lymphs Abs: 1.5 10*3/uL (ref 0.7–4.0)
MCH: 30.6 pg (ref 26.0–34.0)
MCHC: 32.6 g/dL (ref 30.0–36.0)
MCV: 93.9 fL (ref 80.0–100.0)
MONO ABS: 0.7 10*3/uL (ref 0.1–1.0)
MONOS PCT: 8 %
Neutro Abs: 5.6 10*3/uL (ref 1.7–7.7)
Neutrophils Relative %: 71 %
PLATELETS: 233 10*3/uL (ref 150–400)
RBC: 3.76 MIL/uL — ABNORMAL LOW (ref 4.22–5.81)
RDW: 15.6 % — AB (ref 11.5–15.5)
WBC Count: 7.9 10*3/uL (ref 4.0–10.5)
nRBC: 0 % (ref 0.0–0.2)

## 2018-02-19 LAB — CMP (CANCER CENTER ONLY)
ALT: 15 U/L (ref 0–44)
ANION GAP: 13 (ref 5–15)
AST: 12 U/L — ABNORMAL LOW (ref 15–41)
Albumin: 4 g/dL (ref 3.5–5.0)
Alkaline Phosphatase: 79 U/L (ref 38–126)
BUN: 15 mg/dL (ref 8–23)
CHLORIDE: 99 mmol/L (ref 98–111)
CO2: 24 mmol/L (ref 22–32)
CREATININE: 1.01 mg/dL (ref 0.61–1.24)
Calcium: 9.6 mg/dL (ref 8.9–10.3)
Glucose, Bld: 303 mg/dL — ABNORMAL HIGH (ref 70–99)
POTASSIUM: 4.2 mmol/L (ref 3.5–5.1)
Sodium: 136 mmol/L (ref 135–145)
Total Bilirubin: 0.4 mg/dL (ref 0.3–1.2)
Total Protein: 6.9 g/dL (ref 6.5–8.1)

## 2018-02-19 MED ORDER — DEXAMETHASONE SODIUM PHOSPHATE 10 MG/ML IJ SOLN
INTRAMUSCULAR | Status: AC
  Filled 2018-02-19: qty 1

## 2018-02-19 MED ORDER — DEXTROSE 5 % IV SOLN
Freq: Once | INTRAVENOUS | Status: AC
  Administered 2018-02-19: 10:00:00 via INTRAVENOUS
  Filled 2018-02-19: qty 250

## 2018-02-19 MED ORDER — DEXAMETHASONE SODIUM PHOSPHATE 10 MG/ML IJ SOLN
10.0000 mg | Freq: Once | INTRAMUSCULAR | Status: AC
  Administered 2018-02-19: 10 mg via INTRAVENOUS

## 2018-02-19 MED ORDER — ATROPINE SULFATE 1 MG/ML IJ SOLN
INTRAMUSCULAR | Status: AC
  Filled 2018-02-19: qty 1

## 2018-02-19 MED ORDER — PALONOSETRON HCL INJECTION 0.25 MG/5ML
INTRAVENOUS | Status: AC
  Filled 2018-02-19: qty 5

## 2018-02-19 MED ORDER — DEXTROSE 5 % IV SOLN
Freq: Once | INTRAVENOUS | Status: DC
  Filled 2018-02-19: qty 250

## 2018-02-19 MED ORDER — ATROPINE SULFATE 1 MG/ML IJ SOLN
0.5000 mg | Freq: Once | INTRAMUSCULAR | Status: AC | PRN
  Administered 2018-02-19: 0.5 mg via INTRAVENOUS

## 2018-02-19 MED ORDER — PALONOSETRON HCL INJECTION 0.25 MG/5ML
0.2500 mg | Freq: Once | INTRAVENOUS | Status: AC
  Administered 2018-02-19: 0.25 mg via INTRAVENOUS

## 2018-02-19 MED ORDER — OXYCODONE HCL 5 MG PO TABS: 5.0000 mg | ORAL_TABLET | Freq: Four times a day (QID) | ORAL | 0 refills | Status: DC | PRN

## 2018-02-19 MED ORDER — LEUCOVORIN CALCIUM INJECTION 350 MG
400.0000 mg/m2 | Freq: Once | INTRAVENOUS | Status: AC
  Administered 2018-02-19: 676 mg via INTRAVENOUS
  Filled 2018-02-19: qty 33.8

## 2018-02-19 MED ORDER — OXALIPLATIN CHEMO INJECTION 100 MG/20ML
70.0000 mg/m2 | Freq: Once | INTRAVENOUS | Status: AC
  Administered 2018-02-19: 120 mg via INTRAVENOUS
  Filled 2018-02-19: qty 20

## 2018-02-19 MED ORDER — IRINOTECAN HCL CHEMO INJECTION 100 MG/5ML
130.0000 mg/m2 | Freq: Once | INTRAVENOUS | Status: AC
  Administered 2018-02-19: 220 mg via INTRAVENOUS
  Filled 2018-02-19: qty 11

## 2018-02-19 MED ORDER — SODIUM CHLORIDE 0.9% FLUSH
10.0000 mL | Freq: Once | INTRAVENOUS | Status: AC
  Administered 2018-02-19: 10 mL
  Filled 2018-02-19: qty 10

## 2018-02-19 MED ORDER — SODIUM CHLORIDE 0.9 % IV SOLN
Freq: Once | INTRAVENOUS | Status: AC
  Administered 2018-02-19: 13:00:00 via INTRAVENOUS
  Filled 2018-02-19: qty 250

## 2018-02-19 MED ORDER — SODIUM CHLORIDE 0.9 % IV SOLN
2400.0000 mg/m2 | INTRAVENOUS | Status: DC
  Administered 2018-02-19: 4050 mg via INTRAVENOUS
  Filled 2018-02-19: qty 81

## 2018-02-19 NOTE — Progress Notes (Signed)
Nutrition follow-up completed with patient during infusion for pancreas cancer. Weight has slightly improved to 122.4 pounds October 31 from 121 pounds October 15. Patient reports that he is eating well most days.   He reports he drinks boost plus twice daily. He does report taste alterations.  Nutrition diagnosis: Malnutrition continues.  Intervention: Encourage patient to continue high-calorie high-protein foods and shakes between meals. Educated patient regarding strategies to improve taste alterations.  Fact sheets were provided. Questions were answered.  Teach back method used.  Contact information given.  Monitoring, evaluation, goals: Patient will tolerate adequate calories and protein to promote weight gain/weight maintenance.  Next visit: We will schedule follow-up as needed.  **Disclaimer: This note was dictated with voice recognition software. Similar sounding words can inadvertently be transcribed and this note may contain transcription errors which may not have been corrected upon publication of note.**

## 2018-02-19 NOTE — Progress Notes (Signed)
[name]  [phone] [phone]  Clinic Follow up Note   Patient Care Team: Michael Ebbs, MD as PCP - General (Internal Medicine) Michael Harp, MD as PCP - Cardiology (Cardiology) Michael Ada, MD as Consulting Physician (Gastroenterology) Michael Merle, MD as Consulting Physician (Hematology)   Date of Service:  02/19/2018   CHIEF COMPLAINTS:  F/u on Adenocarcinoma of pancreas, unresectable   SUMMARY OF ONCOLOGIC HISTORY: Oncology History   Cancer Staging Malignant neoplasm of body of pancreas [facility]) Staging form: Exocrine Pancreas, AJCC 8th Edition - Clinical stage from 12/19/2017: Stage IIA (cT3, cN0, cM0) - Signed by Michael Merle, MD on 01/13/2018       Malignant neoplasm of body of pancreas ([place])   12/11/2017 Imaging    CT AP WITH CONTRAST IMPRESSION: Focal area of abnormal low attenuation in the pancreatic body measuring 3.7 x 2.9 cm with cut off of the duct most concerning for primary pancreatic neoplasm.  2 small low-attenuation foci identified within the liver which are nonspecific however could represent metastatic disease; one is 1.0 cm complex lesion within the hepatic dome, the other area is a low-attenuation focus adjacent to the falciform ligament likely representing a perfusion anomaly or possible local fatty infiltration  Extensive atherosclerotic changes in the abdominal aorta, iliac vessels, splenic vessels.  Occlusion of the right and possibly left iliac.  Suspected chronic re-constitution of flow identified within the femoral vessels bilaterally.  Prostate brachytherapy consistent with history of malignancy  No evidence of small bowel obstruction.  Findings suggestive of constipation     12/19/2017 Procedure    EUS biopsy: In the body of the pancreas there was a large 20 x 30 mm, approximately, hypoechoic mass. It had irregular margin, but it was round/oval in appearance. There was pancreatic tail atrophy, but the  PD was dilated up to 7.6 mm. The celiac axis was clearly viewed and there was no evidence of invasion. In the head of the pancreas a 1 cm lymph node was identified and the pancreas was lobular in the head and uncinate process. The left adrenal was clearly identified and it was enlarged, but there was no evidence of any masses.    12/19/2017 Initial Biopsy    Diagnosis FINE NEEDLE ASPIRATION,ENDOSCOPIC, PANCREAS BODY (SPECIMEN 1 OF 1 COLLECTED 12/19/17): MALIGNANT CELLS CONSISTENT WITH ADENOCARCINOMA.    12/19/2017 Initial Diagnosis    Malignant neoplasm of body of pancreas ([place])    12/19/2017 Cancer Staging    Staging form: Exocrine Pancreas, AJCC 8th Edition - Clinical stage from 12/19/2017: Stage IIA (cT3, cN0, cM0) - Signed by Michael Merle, MD on 01/13/2018    01/05/2018 Imaging    01/05/2018 MRI Abdomen IMPRESSION: 1. Poorly marginated hypoenhancing 4.2 x 2.5 x 5.2 cm pancreatic body mass with pancreatic tail atrophy and duct dilation, compatible with known pancreatic adenocarcinoma. Pancreatic tumor appears locally advanced with significant vascular involvement as detailed. No biliary ductal dilatation. 2. No abdominal adenopathy. 3. Two similar subcentimeter enhancing left liver lobe masses as detailed, which are too small to accurately characterize on this mildly motion degraded scan. Differential includes small metastases or hemangiomas. Suggest follow-up MRI abdomen without and with IV contrast in 3 months. These lesions are probably too small to characterize by PET-CT. 4.  Aortic Atherosclerosis (ICD10-I70.0).    01/06/2018 Imaging    01/06/2018 CT Chest IMPRESSION: 1. Two non-specific small left lower lobe pulmonary nodules. Otherwise, no evidence of metastatic disease within the chest. 2.  Aortic Atherosclerosis (ICD10-I70.0).  Emphysema (ICD10-J43.9).    01/13/2018 Tumor Marker    Tumor Marker Results for [name], [name] (MRN [id number]) as of 01/13/2018 09:17  Ref. Range  12/30/2017 14:59  CA 19-9 Latest Ref Range: 0 - 35 U/mL 1,263 (H)      01/21/2018 -  Chemotherapy    FOLFIRINOX every 2 weeks starting 01/21/18. Increased to full dose oxaliplatin on cycle 2    02/03/2018 Genetic Testing    Negative genetic testing on the [facility] Multi-Cancer Panel + Brain Cancer Panel. The Multi-Cancer Panel offered by [facility] includes sequencing and/or deletion duplication testing of the following 84 genes: AIP, ALK, APC, ATM, AXIN2,BAP1,  BARD1, BLM, BMPR1A, BRCA1, BRCA2, BRIP1, CASR, CDC73, CDH1, CDK4, CDKN1B, CDKN1C, CDKN2A (p14ARF), CDKN2A (p16INK4a), CEBPA, CHEK2, CTNNA1, DICER1, DIS3L2, EGFR (c.2369C>T, p.Thr790Met variant only), EPCAM (Deletion/duplication testing only), FH, FLCN, GATA2, GPC3, GREM1 (Promoter region deletion/duplication testing only), HOXB13 (c.251G>A, p.Gly84Glu), HRAS, KIT, MAX, MEN1, MET, MITF (c.952G>A, p.Glu318Lys variant only), MLH1, MSH2, MSH3, MSH6, MUTYH, NBN, NF1, NF2, NTHL1, PALB2, PDGFRA, PHOX2B, PMS2, POLD1, POLE, POT1, PRKAR1A, PTCH1, PTEN, RAD50, RAD51C, RAD51D, RB1, RECQL4, RET, RUNX1, SDHAF2, SDHA (sequence changes only), SDHB, SDHC, SDHD, SMAD4, SMARCA4, SMARCB1, SMARCE1, STK11, SUFU, TERC, TERT, TMEM127, TP53, TSC1, TSC2, VHL, WRN and WT1.  The CNS/Brain Cancer Panel offered by [facility] includes sequencing and/or deletion duplication testing of the following 42 genes: ALK, APC, BAP1, BARD1, CDK4, CDKN2A, DICER1, EPCAM, EZH2, GPC3, HRAS, KIF1B, MEN1, MLH1, MSH2, MSH6, NF1, NF2, PHOX2B, PMS2, POT1, PRKAR1A, PTCH2, PTEN, RB1, SMARCA4, SMARCB1, SMARCE1, SUFU, TP53, TSC1, TSC2, and VHL.   The report date is 02/03/2018.     HISTORY OF PRESENTING ILLNESS:  (According to NP Michael Maddox on 12/30/2017) Michael Maddox 67 y.o. male is here at the request of Dr. Carol Maddox. He presented to PCP Dr. Jeanie Maddox c/o 1.5 - 2 month history of abdominal pain, constipation, and 30 lbs weight loss that gradually worsened over time. There was associated epigastric pain. He  underwent CT AP with contrast on 12/11/17 that showed focal area of enlargement involving the pancreatic body measuring approximately 3.7 x 2.9 cm concerning for primary pancreatic neoplasm. Two small lesions were noted in the liver which are indeterminate.  He underwent EUS biopsy on 12/19/2017 per Dr. Benson Norway which showed a large 20 x 30 mm hypoechoic mass in the body of the pancreas with irregular margins.  There was pancreatic tail atrophy and pancreatic duct dilatation up to 7.6 mm.  In the head of the pancreas a 1 cm lymph node was identified.  Biopsy of the pancreas body mass was positive for malignant cells consistent with adenocarcinoma.   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 Tammi Klippel on 04/11/18, followed by Dr. Karsten Ro. HTN, HL, TIA in 2010 s/p endarterectomy, CAD s/p CABG in 2015, PVD requiring revascularization and stenting in the LEs and on anticoagulation with plavix and aspirin; followed by cardiologist Dr. [name]. He is recently diagnosed with DM on metformin, most recent BG 151, and A1c "6.?"   Socially, he He is a former smoker 1 PPD x35 years, quit in 2015; denies drug use or current alcohol use. He quit drinking 2 months ago and before that he drank 1-2 drinks per week. 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 Drives himself and is mostly independent with ADLs. He previously was not very physically active due to claudication.  He has family history of  breast cancer in his mother diagnosed at age 48. Denies other breast, gyn, or colon cancers. He has 3 children who are healthy.   Today, he reports mild fatigue and decreased appetite that began after his prostate cancer treatment that has gradually worsened over 3-4 months. He has lost 30 lbs, drinks glucerna occasionally. He has occasional post-prandial emesis and constant band-like pain across his abdomen, he rates 5/10. He  has taken percocet which he does not find very helpful. He has constipation, but when he does have BM it is light colored and floats, with found odor. Denies dark urine, juandice, fever, chills, diarrhea, cough, chest pain, dyspnea, or leg edema.   CURRENT THERAPY: first line chemo FOLFIRINOX every 2 weeks starting 01/21/18   INTERVAL HISTORY:  BONNY EGGER is a 67 y.o. male who is here for follow-up accompanied by his sister who did most of the talking.  He recently was seen by Symptom management clinic for his severe constipation. He notes having BM every 3 or so days. He was given Senakot-s and Colase. Prior to being seen he was taking Miralax and magnesium citrate as needed but this did not work. His last BM 3 days ago was not hard. When he passes hard stool this effects his hemorrhoids.   He notes he is currently in pain. He takes hydrocodone 4 tabs a day, extra strength tylenol. This does not always get pain relief.  His daughter notes he is not very interested in doing anything since his diagnosis. He denies being depressed. His sister notes she flew out to him for support but does have to go back home soon.  She notes he would get sick on the 3rd day after treatment which lasts for 5 days.     REVIEW OF SYSTEMS:   Constitutional: Denies fevers, chills (+) decreased appetite, stable weight (+) fatigue  Eyes: Denies blurriness of vision Ears, nose, mouth, throat, and face: Denies mucositis or sore throat Respiratory: Denies cough, dyspnea or wheezes Cardiovascular: Denies palpitation, chest discomfort or lower extremity swelling Gastrointestinal:  Denies nausea, heartburn (+) abdominal pain, midabdominal and radiates to his right side and back (+) constipation Skin: Denies abnormal skin rashes Lymphatics: Denies new lymphadenopathy or easy bruising Neurological:Denies numbness, tingling or new weaknesses Behavioral/Psych: Mood is stable, no new changes (+) saddened All other systems  were reviewed with the patient and are negative.  MEDICAL HISTORY:  Past Medical History:  Diagnosis Date  . Arthritis   . Carotid artery disease ([place]) followed by dr berry--- per last duplex [phone]  LICA 38-75%, bilateral ECA >50%,  patent RICA post intervention   hx acute high grade stenosis RICA >80% 02-22-2009 in setting of  stroke  s/p  right carotid endarterectomy 02-28-2009/    . Claudication of both lower extremities ([place])    due to PAD ----  right > left  . COPD (chronic obstructive pulmonary disease) ([place])   . Coronary artery disease CARDIOLOGIST-  DR BERRY   hx positive myoview 07-29-2013;  08-16-2013 per cardiac cath, severe 3V CAD w/ 60% subclavian stenosis;  08-23-2013   s/p  CABG x3  (LIMA to LAD, SVG to OM1, SVG to PDA)  . CVA (cerebral vascular accident) ([place]) 02/22/2009   acute infarct right hemisphere w/ severe RICA stentosis----  per pt no residuals  . Family history of brain cancer   . Family history of breast cancer   . GERD (gastroesophageal reflux disease)   . HTN (hypertension)   . Hyperlipidemia   .  Hyperplasia of prostate with lower urinary tract symptoms (LUTS)   . Peripheral arterial disease ([place]) followed by dr berry--- last dobbler ABIs bilateral SFA occlusions (pt is scheduled for intervention 05-08-2017   hx PTA and stenting to bilateral SFA 2002 and PTA w/ stenting left common iliac artery and for in-stent restenosis 2003 Left SFA and 2005  stenting right SFA for in-stent restenosis  . Prostate cancer [facility]) urologist-  dr ottelin/  oncologist-  dr Tammi Klippel   dx 11-20-2016-- Stage T1c, Gleason 4+3, PSA 5.73, vol 35.95cc--- scheduled for radioactive seed implants 04-11-2017  . 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 S/P peripheral artery angioplasty with stent placement    bilateral SFA 2002;  in-stenosis bilateral SFA  (right 2003, left 2005) and left CIA 2003  . Shortness of breath   . Type II diabetes mellitus ([place])   .  Weak urinary stream   . Wears glasses     SURGICAL HISTORY: Past Surgical History:  Procedure Laterality Date  . ABDOMINAL AORTOGRAM N/A 05/08/2017   Procedure: ABDOMINAL AORTOGRAM;  Surgeon: Michael Harp, MD;  Location: [facility];  Service: Cardiovascular;  Laterality: N/A;  . CARDIAC CATHETERIZATION  12-01-2000    dr berry    non-critial CAD & nonischemic cardiolite-- mLAD 50%, mLCx 70%, prox. to mid RCA 75% focal midsegment 80-85%  . CARDIOVASCULAR STRESS TEST  07-29-2013    dr berry   High risk nuclear study w/ inferoseptal ischemia/  normal LV function and wall motion,  nuclear study ef 52%  . CAROTID ENDARTERECTOMY Right 02-28-2009    dr c. Scot Dock  [facility]   patch angioplasty  . COLONOSCOPY    . CORONARY ARTERY BYPASS GRAFT N/A 08/23/2013   Procedure: CORONARY ARTERY BYPASS GRAFTING (CABG);  Surgeon: Melrose Nakayama, MD;  Location: [place];  Service: Open Heart Surgery;  Laterality: N/A;  Coronary Artery Bypass graft times three using left internal mammary artery and left leg saphenous vein   . CYSTOSCOPY N/A 04/11/2017   Procedure: CYSTOSCOPY FLEXIBLE;  Surgeon: Kathie Rhodes, MD;  Location: [facility];  Service: Urology;  Laterality: N/A;  no seeds found in bladder  . ENDOVASCULAR STENT INSERTION Bilateral right 12-25-2000 ;  left 03-02-2001   dr berry   PTA and stenting to bilateral SFA  . ENDOVASCULAR STENT INSERTION Bilateral right 08-30-2003 ;  left 11-10-2001    dr berry   PTA and stenting for in-stent restenosis right SFA 08-30-2003:  PTA and stenting distal left common iliac artery and in-stent restenosis left SFA 11-10-2001  . FINE NEEDLE ASPIRATION N/A 12/19/2017   Procedure: FINE NEEDLE ASPIRATION (FNA) LINEAR;  Surgeon: Michael Ada, MD;  Location: [facility];  Service: Endoscopy;  Laterality: N/A;  . INTRAOPERATIVE TRANSESOPHAGEAL ECHOCARDIOGRAM N/A 08/23/2013   Procedure: INTRAOPERATIVE TRANSESOPHAGEAL ECHOCARDIOGRAM;  Surgeon: Melrose Nakayama, MD;  Location: [place];  Service: Open Heart Surgery;  Laterality: N/A;  . IR IMAGING GUIDED PORT INSERTION  01/20/2018  . LEFT HEART CATHETERIZATION WITH CORONARY ANGIOGRAM N/A 08/16/2013   Procedure: LEFT HEART CATHETERIZATION WITH CORONARY ANGIOGRAM;  Surgeon: Michael Harp, MD;  Location: [facility] CATH LAB;  Service: Cardiovascular;  Laterality: N/A;   severe 3V CAD and 60% subclavian sternosis  . LOWER EXTREMITY ANGIOGRAM N/A 08/16/2013   Procedure: LOWER EXTREMITY ANGIOGRAM;  Surgeon: Michael Harp, MD;  Location: [facility] C[facility] LAB;  Service: Cardiovascular;  Laterality: N/A;  . LOWER EXTREMITY  ANGIOGRAPHY Bilateral 05/08/2017   Procedure: Lower Extremity Angiography;  Surgeon: Michael Harp, MD;  Location: [place] [facility];  Service: Cardiovascular;  Laterality: Bilateral;  . PERIPHERAL VASCULAR BALLOON ANGIOPLASTY Left 05/08/2017   Procedure: PERIPHERAL VASCULAR BALLOON ANGIOPLASTY;  Surgeon: Michael Harp, MD;  Location: [place] [facility];  Service: Cardiovascular;  Laterality: Left;  COMMON ILIAC  . RADIOACTIVE SEED IMPLANT N/A 04/11/2017   Procedure: RADIOACTIVE SEED IMPLANT/BRACHYTHERAPY IMPLANT;  Surgeon: Kathie Rhodes, MD;  Location: [facility];  Service: Urology;  Laterality: N/A;    67   seeds implanted  . SPACE OAR INSTILLATION N/A 04/11/2017   Procedure: SPACE OAR INSTILLATION;  Surgeon: Kathie Rhodes, MD;  Location: [facility];  Service: Urology;  Laterality: N/A;  . TRANSTHORACIC ECHOCARDIOGRAM  07/15/2013   ef 47-65%, grade 1 diastolic dysfunction/  trivial MR, mild to moderate PR, mild TR   . UPPER ESOPHAGEAL ENDOSCOPIC ULTRASOUND (EUS) N/A 12/19/2017   Procedure: UPPER ESOPHAGEAL ENDOSCOPIC ULTRASOUND (EUS);  Surgeon: Michael Ada, MD;  Location: [facility];  Service: Endoscopy;  Laterality: N/A;    I have reviewed the social history and family history with the patient and they are unchanged from previous note.  ALLERGIES:  has  No Known Allergies.  MEDICATIONS:  Current Outpatient Medications  Medication Sig Dispense Refill  . aspirin 81 MG chewable tablet Chew 1 tablet (81 mg total) by mouth daily.    . cilostazol (PLETAL) 50 MG tablet Take 1 tablet (50 mg total) by mouth 2 (two) times daily. 60 tablet 6  . clopidogrel (PLAVIX) 75 MG tablet Take 75 mg by mouth at bedtime.     Marland Kitchen dexlansoprazole (DEXILANT) 60 MG capsule Take 60 mg by mouth daily as needed (for reflux.).     Marland Kitchen doxycycline (VIBRAMYCIN) 100 MG capsule Take 100 mg by mouth daily as needed for itching.  1  . HYDROcodone-acetaminophen (NORCO) 5-325 MG tablet Take 1-2 tablets by mouth every 6 (six) hours as needed for moderate pain. 45 tablet 0  . hydrocortisone 2.5 % ointment Apply 1 application topically 2 (two) times daily as needed (for skin irritation).   0  . lidocaine-prilocaine (EMLA) cream Apply 1 application topically as needed (local anesthesia).    . LORazepam (ATIVAN) 0.5 MG tablet Take 0.5 tablets (0.25 mg total) by mouth every 8 (eight) hours as needed for anxiety. 30 tablet 0  . metFORMIN (GLUCOPHAGE) 500 MG tablet Take 500 mg by mouth at bedtime.   2  . mirtazapine (REMERON) 7.5 MG tablet Take 1 tablet (7.5 mg total) by mouth at bedtime. 30 tablet 1  . ondansetron (ZOFRAN) 8 MG tablet Take 1 tablet (8 mg total) by mouth 2 (two) times daily as needed for refractory nausea / vomiting. Start on day 3 after chemotherapy. 30 tablet 1  . prochlorperazine (COMPAZINE) 10 MG tablet Take 1 tablet (10 mg total) by mouth every 6 (six) hours as needed (NAUSEA). 30 tablet 1  . simvastatin (ZOCOR) 20 MG tablet Take 20 mg by mouth at bedtime.     . tamsulosin (FLOMAX) 0.4 MG CAPS capsule Take 0.4 mg by mouth every evening.  11  . hydrocortisone (ANUSOL-HC) 2.5 % rectal cream Place 1 application rectally 2 (two) times daily as needed for hemorrhoids. 30 g 1  . metoprolol succinate (TOPROL-XL) 25 MG 24 hr tablet Take 25 mg by mouth at bedtime.     . Multiple  Vitamins-Minerals (MULTIVITAMIN WITH MINERALS) tablet Take 1 tablet by mouth  daily.    . oxyCODONE (OXY IR/ROXICODONE) 5 MG immediate release tablet Take 1 tablet (5 mg total) by mouth every 6 (six) hours as needed for severe pain. 40 tablet 0  . traMADol (ULTRAM) 50 MG tablet Take 50 mg by mouth every 6 (six) hours as needed (pain).      No current facility-administered medications for this visit.    Facility-Administered Medications Ordered in Other Visits  Medication Dose Route Frequency Provider Last Rate Last Dose  . dextrose 5 % solution   Intravenous Once Michael Merle, MD      . fluorouracil (ADRUCIL) 4,050 mg in sodium chloride 0.9 % 69 mL chemo infusion  2,400 mg/m2 (Treatment Plan Recorded) Intravenous 1 day or 1 dose Michael Merle, MD      . irinotecan (CAMPTOSAR) 220 mg in dextrose 5 % 500 mL chemo infusion  130 mg/m2 (Treatment Plan Recorded) Intravenous Once Michael Merle, MD 341 mL/hr at 02/19/18 1310 220 mg at 02/19/18 1310  . leucovorin 676 mg in dextrose 5 % 250 mL infusion  400 mg/m2 (Treatment Plan Recorded) Intravenous Once Michael Merle, MD 189 mL/hr at 02/19/18 1313 676 mg at 02/19/18 1313  . sodium chloride flush (NS) 0.9 % injection 10 mL  10 mL Intracatheter Once Michael Merle, MD        PHYSICAL EXAMINATION: ECOG PERFORMANCE STATUS: 1 - Symptomatic but completely ambulatory  Vitals:   02/19/18 0829  BP: 132/71  Pulse: 81  Resp: 18  Temp: 98.7 F (37.1 C)  SpO2: 100%   Filed Weights   02/19/18 0829  Weight: 122 lb 6.4 oz (55.5 kg)    GENERAL:alert, no distress and comfortable SKIN: skin color, texture, turgor are normal, no rashes or significant lesions EYES: normal, Conjunctiva are pink and non-injected, sclera clear OROPHARYNX:no exudate, no erythema and lips, buccal mucosa, and tongue normal  NECK: supple, thyroid normal size, non-tender, without nodularity LYMPH:  no palpable lymphadenopathy in the cervical, axillary or inguinal LUNGS: clear to auscultation and  percussion with normal breathing effort HEART: regular rate & rhythm and no murmurs and no lower extremity edema ABDOMEN:abdomen soft, (+) tenderness at epigastric area, normal bowel sounds Musculoskeletal:no cyanosis of digits and no clubbing  NEURO: alert & oriented x 3 with fluent speech, no focal motor/sensory deficits  LABORATORY DATA:  I have reviewed the data as listed CBC Latest Ref Rng & Units 02/19/2018 02/13/2018 02/03/2018  WBC 4.0 - 10.5 K/uL 7.9 7.7 4.7  Hemoglobin 13.0 - 17.0 g/dL 11.5(L) 10.8(L) 10.2(L)  Hematocrit 39.0 - 52.0 % 35.3(L) 33.6(L) 31.0(L)  Platelets 150 - 400 K/uL 233 181 179     CMP Latest Ref Rng & Units 02/19/2018 02/13/2018 02/03/2018  Glucose 70 - 99 mg/dL 303(H) 213(H) 232(H)  BUN 8 - 23 mg/dL '15 12 13  '$ Creatinine 0.61 - 1.24 mg/dL 1.01 0.91 0.84  Sodium 135 - 145 mmol/L 136 139 138  Potassium 3.5 - 5.1 mmol/L 4.2 4.3 3.8  Chloride 98 - 111 mmol/L 99 102 103  CO2 22 - 32 mmol/L '24 28 26  '$ Calcium 8.9 - 10.3 mg/dL 9.6 9.3 9.1  Total Protein 6.5 - 8.1 g/dL 6.9 6.7 6.1(L)  Total Bilirubin 0.3 - 1.2 mg/dL 0.4 0.4 0.4  Alkaline Phos 38 - 126 U/L 79 65 46  AST 15 - 41 U/L 12(L) 13(L) 20  ALT 0 - 44 U/L '15 22 22    '$ Tumor Marker CA19.9 (0-35 U/ML) 12/30/2017: 1263 10/15/29019: 1337    PATHOLOGY  Diagnosis 12/19/17  FINE NEEDLE ASPIRATION,ENDOSCOPIC, PANCREAS BODY (SPECIMEN 1 OF 1 COLLECTED 12/19/17): MALIGNANT CELLS CONSISTENT WITH ADENOCARCINOMA.   PROCEDURE  Upper endoscopic [place] 12/19/17 per Dr. Carol Maddox  In the body of the pancreas there was a large 20 x 30 mm, approximately, hypoechoic mass. It had irregular margin, but it was round/oval in appearance. There was pancreatic tail atrophy, but the PD was dilated up to 7.6 mm. The celiac axis was clearly viewed and there was no evidence of invasion. In the head of the pancreas a 1 cm lymph node was identified and the pancreas was lobular in the head and uncinate process. The left adrenal  was clearly identified and it was enlarged, but there was no evidence of any masses.   RADIOGRAPHIC STUDIES: I have personally reviewed the radiological images as listed and agreed with the findings in the report.  01/06/2018 CT Chest IMPRESSION: 1. Two non-specific small left lower lobe pulmonary nodules. Otherwise, no evidence of metastatic disease within the chest. 2.  Aortic Atherosclerosis (ICD10-I70.0).  Emphysema (ICD10-J43.9).  01/05/2018 MRI Abdomen IMPRESSION: 1. Poorly marginated hypoenhancing 4.2 x 2.5 x 5.2 cm pancreatic body mass with pancreatic tail atrophy and duct dilation, compatible with known pancreatic adenocarcinoma. Pancreatic tumor appears locally advanced with significant vascular involvement as detailed. No biliary ductal dilatation. 2. No abdominal adenopathy. 3. Two similar subcentimeter enhancing left liver lobe masses as detailed, which are too small to accurately characterize on this mildly motion degraded scan. Differential includes small metastases or hemangiomas. Suggest follow-up MRI abdomen without and with IV contrast in 3 months. These lesions are probably too small to characterize by PET-CT. 4.  Aortic Atherosclerosis (ICD10-I70.0).  OUTSIDE CT CT AP WITH CONTRAST IMPRESSION: (outside records) 12/11/2017 Focal area of abnormal low attenuation in the pancreatic body measuring 3.7 x 2.9 cm with cut off of the duct most concerning for primary pancreatic neoplasm.  2 small low-attenuation foci identified within the liver which are nonspecific however could represent metastatic disease; one is 1.0 cm complex lesion within the hepatic dome, the other area is a low-attenuation focus adjacent to the falciform ligament likely representing a perfusion anomaly or possible local fatty infiltration  Extensive atherosclerotic changes in the abdominal aorta, iliac vessels, splenic vessels.  Occlusion of the right and possibly left iliac.  Suspected chronic  re-constitution of flow identified within the femoral vessels bilaterally.  Prostate brachytherapy consistent with history of malignancy  No evidence of small bowel obstruction.  Findings suggestive of constipation     ASSESSMENT & PLAN:  Michael Maddox is a pleasant 67 y.o. male with history of prostate cancer presented with abdominal pain, 30 lbs weight loss, and constipation worsening over 3-4 months   1. Adenocarcinoma of body of pancreas, cT3N0Mx, unresectable, with indeterminate lung nodes and liver lesions, MSI pending, BRCA mutation (-) -We previously reviewed his medical record including imaging and pathology in detail with the patient and family.  -He has what appears to be localized adenocarcinoma in the body of the pancreas, with 2 indeterminate liver lesions and lung nodules.  -I previously reviewed his recent CT chest, and abdominal MRI image by myself and agree with the radiologist interpretation.  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  His case was discussed in our GI tumor board. -we discussed the incurable nature of his cancer and the goal  of therapy is is palliative  -His genetics was negative for BRCA1 and BRCA2 mutation, he is not a candidate for PARP inhibitor  -I have requested MMR test on his biopsy, I am not sure if we have enough tissue for foundation one molecular testing -He started FOLFIRINOX on 01/21/18 and has moderately tolerated with constipation, fatigue, weight loss and appetite loss.  -I discussed with patient and his sister that his cancer is not curable but still treatable to control his disease and prolong his life. He and his family is concerned with his quality of life on chemo. I will reduce his chemo so he can better tolerate.   -Labs reviewed,  Hg at 11.5, Glucose at 303. I discussed needing to control his sugar as this can cause other complications. Overall adequate to proceed with cycle 3 today at reduced dose.  -Plan to repeat restaging scan after cycle 5 --F/u in 2 weeks.    2. Constipation and abdominal pain  -He is currently on low dose Hydrocodone given 02/03/18 about 4 tabs daily, pain not consistently controlled.  -I will prescribed him Oxycodone '5mg'$  q6hours today (02/19/18). Will continue the Norco he has at home as needed in addition to oxycodone  -I discussed his constipation can worsen with pain medication, he understands.  -I encouraged him to increase water and prune juice.  -I discussed increasing Senakot-s or Miralax up to 8 times a day as needed to make sure he has more regular bowel movements, will monitor.  -if his pain is not well controlled, will add long acting narcotics in a few weeks    3. Low appetite, weight loss  -He has previously seen by Lorrin Jackson. I encouraged him to continue to follow up with her.  -He is currently on Mirtazapine 7.'5mg'$ , I recommend he increase to '15mg'$  daily to help his appetite. He agreed.   4. Depression and Anxiety -Pt denies depression, but he feels down, lost interests in many things  -Due to anxiety on treatment days NP Michael Maddox previously prescribed him Ativan as needed.  -I will set up consult with social work -I increased his mirtazapine dose   5. Adenocarcinoma of the prostate stage T1c  -on 11/20/2016 with gleason score 4+3 and PSA 5.73 s/p radioactive seed implant per Dr. Tammi Klippel on 04/11/18, followed by Dr. Karsten Ro.   6. HTN, HL, DM -ON plavis, aspirin, metformin -followed by PCP Dr. Jeanie Maddox -We previously discussed the impact of chemotherapy on his blood pressure and glucose level, and adjust his medication as needed. -BP normal, Glucose at 303 today (02/19/18). I discussed this high glucose can be related to his pancreatic cancer and steroid pre-med.  -I  discussed needing to control his BG as this can cause other complications. I strongly encouraged him to follow up with PCP for medication adjustment.   -I strongly encouraged him to check his glucose levels at home daily and lower his sugar intake.     7. TIA in 2010 s/p endarterectomy, CAD s/p CABG in 2015, PVD  -requiring revascularization and stenting in the LEs and on anticoagulation with plavix and aspirin; followed by cardiologist Dr. [name].   8. Genetics  -we previously referred pt to genetics  -His genetic testing was negative, including BRCA1 and BRCA2  9. Goal of care discussion  -We again discussed the incurable nature of his cancer, and the overall poor prognosis, especially if he does not have good response to chemotherapy or progress on chemo -The patient understands the goal of  care is palliative. -We reviewed the goal of care again today, had a long conversation about quality of life versus quality of life, due to the moderate side effects from chemo, I will reduce his chemo dose, to make it more tolerable.  Also discussed if he wishes we can stop chemo.  -He is full code now    PLAN: -I prescribed him Oxycodone today and encouraged him to increase stool softeners and laxatives.  -Labs reviewed and adequate to proceed with FOLFIRINOX today at reduced dose.  -Lab, flush, F/u and FOLFIRINOX in 2 weeks  -Lab, flush, f/u and chemo FOLFIRINOX in 5 and 7 weeks      No problem-specific Assessment & Plan notes found for this encounter.   No orders of the defined types were placed in this encounter.  Pt's sister had many questions today, especially the goal of care, management of constipation and depression, all questions were answered to the best I can. The patient knows to call the clinic with any problems, questions or concerns. No barriers to learning was detected. I spent 35 minutes counseling the patient face to face. The total time spent in the appointment was 45  minutes and more than 50% was on counseling and review of test results  I, Joslyn Devon, am acting as scribe for Michael Merle, MD.   I have reviewed the above documentation for accuracy and completeness, and I agree with the above.      Michael Merle, MD 02/19/2018

## 2018-02-19 NOTE — Patient Instructions (Signed)
[facility] Discharge Instructions for Patients Receiving Chemotherapy  Today you received the following chemotherapy agents :  Oxaliplatin,  Irinotecan, Leucovorin, Fluorouracil.  To help prevent nausea and vomiting after your treatment, we encourage you to take your nausea medication as prescribed.   If you develop nausea and vomiting that is not controlled by your nausea medication, call the clinic.   BELOW ARE SYMPTOMS THAT SHOULD BE REPORTED IMMEDIATELY:  *FEVER GREATER THAN 100.5 F  *CHILLS WITH OR WITHOUT FEVER  NAUSEA AND VOMITING THAT IS NOT CONTROLLED WITH YOUR NAUSEA MEDICATION  *UNUSUAL SHORTNESS OF BREATH  *UNUSUAL BRUISING OR BLEEDING  TENDERNESS IN MOUTH AND THROAT WITH OR WITHOUT PRESENCE OF ULCERS  *URINARY PROBLEMS  *BOWEL PROBLEMS  UNUSUAL RASH Items with * indicate a potential emergency and should be followed up as soon as possible.  Feel free to call the clinic should you have any questions or concerns. The clinic phone number is [phone].  Please show the CHEMO ALERT CARD at check-in to the Emergency Department and triage nurse.   

## 2018-02-20 ENCOUNTER — Telehealth: Payer: Self-pay | Admitting: Hematology

## 2018-02-20 ENCOUNTER — Telehealth: Payer: Self-pay | Admitting: General Practice

## 2018-02-20 NOTE — Telephone Encounter (Signed)
[facility] CSW Progress Notes  Request received from MD to reach out to patient to offer support and resources.  Called patient, scheduled to meet him at noon on 11/14 when he is here for infusion.  Pt did not want to come in any earlier, but that is an option if desired.  CSW will mail him information packet w resources and my contact information, encouraged to reach out as needed at any time.  Edwyna Shell, LCSW Clinical Social Worker Phone:  [phone]

## 2018-02-20 NOTE — Telephone Encounter (Signed)
Appts scheduled patient notified he will get updated schedule at next appt per 10/31 los

## 2018-02-21 ENCOUNTER — Emergency Department (HOSPITAL_COMMUNITY): Payer: Medicare Other

## 2018-02-21 ENCOUNTER — Other Ambulatory Visit: Payer: Self-pay

## 2018-02-21 ENCOUNTER — Inpatient Hospital Stay: Payer: Medicare Other | Attending: Hematology

## 2018-02-21 ENCOUNTER — Encounter (HOSPITAL_COMMUNITY): Payer: Self-pay | Admitting: Emergency Medicine

## 2018-02-21 ENCOUNTER — Emergency Department (HOSPITAL_COMMUNITY)
Admission: EM | Admit: 2018-02-21 | Discharge: 2018-02-21 | Disposition: A | Discharge status: Home / Self Care | Payer: Medicare Other | Attending: Emergency Medicine | Admitting: Emergency Medicine

## 2018-02-21 VITALS — BP 147/67 | HR 86 | Temp 98.3°F | Resp 18

## 2018-02-21 DIAGNOSIS — Z95828 Presence of other vascular implants and grafts: Secondary | ICD-10-CM | POA: Diagnosis not present

## 2018-02-21 DIAGNOSIS — I2510 Atherosclerotic heart disease of native coronary artery without angina pectoris: Secondary | ICD-10-CM | POA: Diagnosis not present

## 2018-02-21 DIAGNOSIS — I700 Atherosclerosis of aorta: Secondary | ICD-10-CM | POA: Insufficient documentation

## 2018-02-21 DIAGNOSIS — C61 Malignant neoplasm of prostate: Secondary | ICD-10-CM | POA: Insufficient documentation

## 2018-02-21 DIAGNOSIS — C251 Malignant neoplasm of body of pancreas: Secondary | ICD-10-CM | POA: Diagnosis not present

## 2018-02-21 DIAGNOSIS — Z5111 Encounter for antineoplastic chemotherapy: Secondary | ICD-10-CM | POA: Diagnosis not present

## 2018-02-21 DIAGNOSIS — Z87891 Personal history of nicotine dependence: Secondary | ICD-10-CM | POA: Diagnosis not present

## 2018-02-21 DIAGNOSIS — R112 Nausea with vomiting, unspecified: Secondary | ICD-10-CM | POA: Insufficient documentation

## 2018-02-21 DIAGNOSIS — E119 Type 2 diabetes mellitus without complications: Secondary | ICD-10-CM | POA: Insufficient documentation

## 2018-02-21 DIAGNOSIS — Z5189 Encounter for other specified aftercare: Secondary | ICD-10-CM | POA: Diagnosis not present

## 2018-02-21 DIAGNOSIS — I10 Essential (primary) hypertension: Secondary | ICD-10-CM | POA: Diagnosis not present

## 2018-02-21 DIAGNOSIS — Z7982 Long term (current) use of aspirin: Secondary | ICD-10-CM | POA: Insufficient documentation

## 2018-02-21 DIAGNOSIS — F329 Major depressive disorder, single episode, unspecified: Secondary | ICD-10-CM | POA: Insufficient documentation

## 2018-02-21 DIAGNOSIS — J449 Chronic obstructive pulmonary disease, unspecified: Secondary | ICD-10-CM | POA: Insufficient documentation

## 2018-02-21 DIAGNOSIS — R42 Dizziness and giddiness: Secondary | ICD-10-CM | POA: Diagnosis not present

## 2018-02-21 DIAGNOSIS — F419 Anxiety disorder, unspecified: Secondary | ICD-10-CM | POA: Diagnosis not present

## 2018-02-21 DIAGNOSIS — E785 Hyperlipidemia, unspecified: Secondary | ICD-10-CM | POA: Insufficient documentation

## 2018-02-21 DIAGNOSIS — Z79899 Other long term (current) drug therapy: Secondary | ICD-10-CM | POA: Diagnosis not present

## 2018-02-21 DIAGNOSIS — R110 Nausea: Secondary | ICD-10-CM | POA: Insufficient documentation

## 2018-02-21 DIAGNOSIS — Z794 Long term (current) use of insulin: Secondary | ICD-10-CM | POA: Insufficient documentation

## 2018-02-21 DIAGNOSIS — R634 Abnormal weight loss: Secondary | ICD-10-CM | POA: Insufficient documentation

## 2018-02-21 DIAGNOSIS — R066 Hiccough: Secondary | ICD-10-CM | POA: Diagnosis not present

## 2018-02-21 LAB — COMPREHENSIVE METABOLIC PANEL
ALT: 33 U/L (ref 0–44)
AST: 39 U/L (ref 15–41)
Albumin: 4.9 g/dL (ref 3.5–5.0)
Alkaline Phosphatase: 69 U/L (ref 38–126)
Anion gap: 9 (ref 5–15)
BILIRUBIN TOTAL: 1.5 mg/dL — AB (ref 0.3–1.2)
BUN: 24 mg/dL — ABNORMAL HIGH (ref 8–23)
CHLORIDE: 97 mmol/L — AB (ref 98–111)
CO2: 28 mmol/L (ref 22–32)
Calcium: 9.7 mg/dL (ref 8.9–10.3)
Creatinine, Ser: 1.04 mg/dL (ref 0.61–1.24)
Glucose, Bld: 225 mg/dL — ABNORMAL HIGH (ref 70–99)
POTASSIUM: 5 mmol/L (ref 3.5–5.1)
Sodium: 134 mmol/L — ABNORMAL LOW (ref 135–145)
TOTAL PROTEIN: 7.8 g/dL (ref 6.5–8.1)

## 2018-02-21 LAB — CBC
HEMATOCRIT: 39.9 % (ref 39.0–52.0)
HEMOGLOBIN: 12.6 g/dL — AB (ref 13.0–17.0)
MCH: 30.1 pg (ref 26.0–34.0)
MCHC: 31.6 g/dL (ref 30.0–36.0)
MCV: 95.2 fL (ref 80.0–100.0)
Platelets: 155 10*3/uL (ref 150–400)
RBC: 4.19 MIL/uL — ABNORMAL LOW (ref 4.22–5.81)
RDW: 15.9 % — ABNORMAL HIGH (ref 11.5–15.5)
WBC: 4.7 10*3/uL (ref 4.0–10.5)
nRBC: 0 % (ref 0.0–0.2)

## 2018-02-21 LAB — CBG MONITORING, ED
GLUCOSE-CAPILLARY: 209 mg/dL — AB (ref 70–99)
Glucose-Capillary: 239 mg/dL — ABNORMAL HIGH (ref 70–99)

## 2018-02-21 LAB — LIPASE, BLOOD: LIPASE: 34 U/L (ref 11–51)

## 2018-02-21 LAB — URINALYSIS, ROUTINE W REFLEX MICROSCOPIC
Bilirubin Urine: NEGATIVE
Glucose, UA: >=500 mg/dL — AB
Hgb urine dipstick: NEGATIVE
Ketones, ur: NEGATIVE mg/dL
LEUKOCYTES UA: NEGATIVE
NITRITE: NEGATIVE
PH: 5 (ref 5.0–8.0)
PROTEIN: NEGATIVE mg/dL
Specific Gravity, Urine: 1.022 (ref 1.005–1.030)

## 2018-02-21 LAB — I-STAT TROPONIN, ED: Troponin i, poc: 0 ng/mL (ref 0.00–0.08)

## 2018-02-21 MED ORDER — PEGFILGRASTIM-CBQV 6 MG/0.6ML ~~LOC~~ SOSY
PREFILLED_SYRINGE | SUBCUTANEOUS | Status: AC
  Filled 2018-02-21: qty 0.6

## 2018-02-21 MED ORDER — SODIUM CHLORIDE 0.9% FLUSH
10.0000 mL | INTRAVENOUS | Status: DC | PRN
  Administered 2018-02-21: 10 mL
  Filled 2018-02-21: qty 10

## 2018-02-21 MED ORDER — HEPARIN SOD (PORK) LOCK FLUSH 100 UNIT/ML IV SOLN
500.0000 [IU] | Freq: Once | INTRAVENOUS | Status: AC | PRN
  Administered 2018-02-21: 500 [IU]
  Filled 2018-02-21: qty 5

## 2018-02-21 MED ORDER — CHLORPROMAZINE HCL 25 MG PO TABS: 25.0000 mg | ORAL_TABLET | Freq: Three times a day (TID) | ORAL | 0 refills | Status: DC | PRN

## 2018-02-21 MED ORDER — SODIUM CHLORIDE 0.9 % IV BOLUS
1000.0000 mL | Freq: Once | INTRAVENOUS | Status: AC
  Administered 2018-02-21: 1000 mL via INTRAVENOUS

## 2018-02-21 MED ORDER — HYDROCODONE-ACETAMINOPHEN 5-325 MG PO TABS: 1.0000 | ORAL_TABLET | Freq: Four times a day (QID) | ORAL | 0 refills | Status: DC | PRN

## 2018-02-21 MED ORDER — LORAZEPAM 1 MG PO TABS: 1.0000 mg | ORAL_TABLET | Freq: Every day | ORAL | 0 refills | Status: DC

## 2018-02-21 MED ORDER — PEGFILGRASTIM-CBQV 6 MG/0.6ML ~~LOC~~ SOSY
6.0000 mg | PREFILLED_SYRINGE | Freq: Once | SUBCUTANEOUS | Status: AC
  Administered 2018-02-21: 6 mg via SUBCUTANEOUS

## 2018-02-21 NOTE — ED Notes (Signed)
Bed: WA08 Expected date:  Expected time:  Means of arrival:  Comments: Triage pt

## 2018-02-21 NOTE — ED Triage Notes (Signed)
Pt with emesis x several days and dizziness. Pt with loss of appetite and unable to keep anything down. Pt's family reports that his blood sugars have been running over 300 over the past several days and pt recently started insulin.

## 2018-02-21 NOTE — Discharge Instructions (Addendum)
Drink plenty of fluids and get plenty of rest.  Eat a diet of bland foods that will not upset your stomach.  Take your Zofran as needed for nausea.  You can take Thorazine up to 3 times daily as needed for hiccups.  He can take your hydrocodone and oxycodone as prescribed.  Take Ativan at night as needed for sleep.  Follow-up with your oncologist for reevaluation of your symptoms.  I have also provided you with the number for hospice and palliative care.  Give him a call and they can help set you up with further resources.  Return to the emergency department if any concerning signs or symptoms develop such as fevers, persistent vomiting, or worsening pain.

## 2018-02-21 NOTE — ED Provider Notes (Signed)
[facility] DEPT Provider Note   CSN: [id number] Arrival date & time: 02/21/18  1342     History   Chief Complaint Chief Complaint  Patient presents with  . Emesis  . Dizziness    HPI Michael Maddox is a 67 y.o. male with history of arthritis, CAD status post CABG x3, CVA, COPD, diabetes mellitus, hypertension, hyperlipidemia, PAD, GERD, prostate cancer presents for evaluation of acute onset, resolved episode of nausea and vomiting.  He was seen by his oncologist 3 days ago on 02/19/2018 where and his chemo dose was reduced.  He did receive an infusion that day.  He was hyperglycemic at the time and his oncologist recommended follow-up with his PCP immediately for further evaluation.  His PCP started him on insulin that day.  His sister notes that his blood sugars have been anywhere from the 100s to 200s.  He developed nausea and vomiting 2 days ago, hiccuping yesterday.  He denies any chest pain or shortness of breath.  No fevers.  He does note feeling lightheaded.  Earlier today while at the cancer center the patient acutely began to feel ill and had multiple episodes of nonbloody nonbilious emesis and became diaphoretic and lightheaded.  This lasted a few minutes and then resolved.  He does note issues with constipation but had a bowel movement yesterday which he states was "kind of soft ".  Denies melena or hematochezia.  His sister does note difficulty sleeping, ongoing weight loss, and decreased appetite. He does note chronic intermittent upper abdominal pain which is sharp, none currently.   The history is provided by the patient.    Past Medical History:  Diagnosis Date  . Arthritis   . Carotid artery disease ([place]) followed by dr berry--- per last duplex [id number]  LICA 38-10%, bilateral ECA >50%,  patent RICA post intervention   hx acute high grade stenosis RICA >80% 02-22-2009 in setting of  stroke  s/p  right carotid endarterectomy 02-28-2009/     . Claudication of both lower extremities ([place])    due to PAD ----  right > left  . COPD (chronic obstructive pulmonary disease) ([place])   . Coronary artery disease CARDIOLOGIST-  DR BERRY   hx positive myoview 07-29-2013;  08-16-2013 per cardiac cath, severe 3V CAD w/ 60% subclavian stenosis;  08-23-2013   s/p  CABG x3  (LIMA to LAD, SVG to OM1, SVG to PDA)  . CVA (cerebral vascular accident) ([place]) 02/22/2009   acute infarct right hemisphere w/ severe RICA stentosis----  per pt no residuals  . Family history of brain cancer   . Family history of breast cancer   . GERD (gastroesophageal reflux disease)   . HTN (hypertension)   . Hyperlipidemia   . Hyperplasia of prostate with lower urinary tract symptoms (LUTS)   . Peripheral arterial disease ([place]) followed by dr berry--- last dobbler ABIs bilateral SFA occlusions (pt is scheduled for intervention 05-08-2017   hx PTA and stenting to bilateral SFA 2002 and PTA w/ stenting left common iliac artery and for in-stent restenosis 2003 Left SFA and 2005  stenting right SFA for in-stent restenosis  . Prostate cancer [facility]) urologist-  dr ottelin/  oncologist-  dr Tammi Klippel   dx 11-20-2016-- Stage T1c, Gleason 4+3, PSA 5.73, vol 35.95cc--- scheduled for radioactive seed implants 04-11-2017  . 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 S/P peripheral artery angioplasty with stent placement  bilateral SFA 2002;  in-stenosis bilateral SFA  (right 2003, left 2005) and left CIA 2003  . Shortness of breath   . Type II diabetes mellitus ([place])   . Weak urinary stream   . Wears glasses     Patient Active Problem List   Diagnosis Date Noted  . Genetic testing 02/04/2018  . Port-A-Cath in place 02/03/2018  . Family history of breast cancer   . Family history of brain cancer   . Goals of care, counseling/discussion 01/13/2018  . Malignant neoplasm of body of pancreas ([place]) 12/30/2017  . Non-insulin treated type 2 diabetes mellitus  ([place]) 08/14/2017  . Claudication in peripheral vascular disease ([place]) 05/08/2017  . Adenocarcinoma of prostate ([place]) 01/15/2017  . S/P CABG x 3 08/23/2013  . CAD (coronary artery disease) 08/16/2013  . Peripheral arterial disease ([place]) 07/02/2013  . Carotid artery disease ([place]) 07/02/2013  . Essential hypertension 07/02/2013  . Hyperlipidemia 07/02/2013  . Palpitations 07/02/2013    Past Surgical History:  Procedure Laterality Date  . ABDOMINAL AORTOGRAM N/A 05/08/2017   Procedure: ABDOMINAL AORTOGRAM;  Surgeon: Lorretta Harp, MD;  Location: [facility];  Service: Cardiovascular;  Laterality: N/A;  . CARDIAC CATHETERIZATION  12-01-2000    dr berry    non-critial CAD & nonischemic cardiolite-- mLAD 50%, mLCx 70%, prox. to mid RCA 75% focal midsegment 80-85%  . CARDIOVASCULAR STRESS TEST  07-29-2013    dr berry   High risk nuclear study w/ inferoseptal ischemia/  normal LV function and wall motion,  nuclear study ef 52%  . CAROTID ENDARTERECTOMY Right 02-28-2009    dr c. Scot Dock  [facility]   patch angioplasty  . COLONOSCOPY    . CORONARY ARTERY BYPASS GRAFT N/A 08/23/2013   Procedure: CORONARY ARTERY BYPASS GRAFTING (CABG);  Surgeon: Melrose Nakayama, MD;  Location: [place];  Service: Open Heart Surgery;  Laterality: N/A;  Coronary Artery Bypass graft times three using left internal mammary artery and left leg saphenous vein   . CYSTOSCOPY N/A 04/11/2017   Procedure: CYSTOSCOPY FLEXIBLE;  Surgeon: Kathie Rhodes, MD;  Location: [facility];  Service: Urology;  Laterality: N/A;  no seeds found in bladder  . ENDOVASCULAR STENT INSERTION Bilateral right 12-25-2000 ;  left 03-02-2001   dr berry   PTA and stenting to bilateral SFA  . ENDOVASCULAR STENT INSERTION Bilateral right 08-30-2003 ;  left 11-10-2001    dr berry   PTA and stenting for in-stent restenosis right SFA 08-30-2003:  PTA and stenting distal left common iliac artery and in-stent restenosis left SFA 11-10-2001   . FINE NEEDLE ASPIRATION N/A 12/19/2017   Procedure: FINE NEEDLE ASPIRATION (FNA) LINEAR;  Surgeon: Carol Ada, MD;  Location: [facility];  Service: Endoscopy;  Laterality: N/A;  . INTRAOPERATIVE TRANSESOPHAGEAL ECHOCARDIOGRAM N/A 08/23/2013   Procedure: INTRAOPERATIVE TRANSESOPHAGEAL ECHOCARDIOGRAM;  Surgeon: Melrose Nakayama, MD;  Location: [facility];  Service: Open Heart Surgery;  Laterality: N/A;  . IR IMAGING GUIDED PORT INSERTION  01/20/2018  . LEFT HEART CATHETERIZATION WITH CORONARY ANGIOGRAM N/A 08/16/2013   Procedure: LEFT HEART CATHETERIZATION WITH CORONARY ANGIOGRAM;  Surgeon: Lorretta Harp, MD;  Location: [facility] CATH LAB;  Service: Cardiovascular;  Laterality: N/A;   severe 3V CAD and 60% subclavian sternosis  . LOWER EXTREMITY ANGIOGRAM N/A 08/16/2013   Procedure: LOWER EXTREMITY ANGIOGRAM;  Surgeon: Lorretta Harp, MD;  Location: [facility] CATH LAB;  Service: Cardiovascular;  Laterality: N/A;  . LOWER EXTREMITY ANGIOGRAPHY Bilateral 05/08/2017   Procedure: Lower  Extremity Angiography;  Surgeon: Lorretta Harp, MD;  Location: [place] CV LAB;  Service: Cardiovascular;  Laterality: Bilateral;  . PERIPHERAL VASCULAR BALLOON ANGIOPLASTY Left 05/08/2017   Procedure: PERIPHERAL VASCULAR BALLOON ANGIOPLASTY;  Surgeon: Lorretta Harp, MD;  Location: [place] CV LAB;  Service: Cardiovascular;  Laterality: Left;  COMMON ILIAC  . RADIOACTIVE SEED IMPLANT N/A 04/11/2017   Procedure: RADIOACTIVE SEED IMPLANT/BRACHYTHERAPY IMPLANT;  Surgeon: Kathie Rhodes, MD;  Location: [facility];  Service: Urology;  Laterality: N/A;    67   seeds implanted  . SPACE OAR INSTILLATION N/A 04/11/2017   Procedure: SPACE OAR INSTILLATION;  Surgeon: Kathie Rhodes, MD;  Location: [facility];  Service: Urology;  Laterality: N/A;  . TRANSTHORACIC ECHOCARDIOGRAM  07/15/2013   ef 70-17%, grade 1 diastolic dysfunction/  trivial MR, mild to moderate PR, mild TR   . UPPER ESOPHAGEAL  ENDOSCOPIC ULTRASOUND (EUS) N/A 12/19/2017   Procedure: UPPER ESOPHAGEAL ENDOSCOPIC ULTRASOUND (EUS);  Surgeon: Carol Ada, MD;  Location: [facility];  Service: Endoscopy;  Laterality: N/A;        Home Medications    Prior to Admission medications   Medication Sig Start Date End Date Taking? Authorizing Provider  aspirin 81 MG chewable tablet Chew 1 tablet (81 mg total) by mouth daily. 08/17/13   Brett Canales, PA-C  chlorproMAZINE (THORAZINE) 25 MG tablet Take 1 tablet (25 mg total) by mouth 3 (three) times daily as needed for hiccoughs. 02/21/18   Lamoine Magallon A, PA-C  cilostazol (PLETAL) 50 MG tablet Take 1 tablet (50 mg total) by mouth 2 (two) times daily. 05/21/17   Lorretta Harp, MD  clopidogrel (PLAVIX) 75 MG tablet Take 75 mg by mouth at bedtime.  07/21/13   [provider]  dexlansoprazole (DEXILANT) 60 MG capsule Take 60 mg by mouth daily as needed (for reflux.).     [provider]  doxycycline (VIBRAMYCIN) 100 MG capsule Take 100 mg by mouth daily as needed for itching. 12/24/17   [provider]  HYDROcodone-acetaminophen (NORCO/VICODIN) 5-325 MG tablet Take 1 tablet by mouth every 6 (six) hours as needed for severe pain. 02/21/18   Vedant Shehadeh A, PA-C  hydrocortisone (ANUSOL-HC) 2.5 % rectal cream Place 1 application rectally 2 (two) times daily as needed for hemorrhoids. 02/03/18   Alla Feeling, NP  hydrocortisone 2.5 % ointment Apply 1 application topically 2 (two) times daily as needed (for skin irritation).  02/18/17   [provider]  lidocaine-prilocaine (EMLA) cream Apply 1 application topically as needed (local anesthesia).    [provider]  LORazepam (ATIVAN) 1 MG tablet Take 1 tablet (1 mg total) by mouth at bedtime. 02/21/18   Nery Kalisz A, PA-C  metFORMIN (GLUCOPHAGE) 500 MG tablet Take 500 mg by mouth at bedtime.  04/14/17   [provider]  metoprolol succinate (TOPROL-XL) 25 MG 24 hr tablet Take 25 mg by  mouth at bedtime.  08/31/13   [provider]  mirtazapine (REMERON) 7.5 MG tablet Take 1 tablet (7.5 mg total) by mouth at bedtime. 01/21/18   Alla Feeling, NP  Multiple Vitamins-Minerals (MULTIVITAMIN WITH MINERALS) tablet Take 1 tablet by mouth daily.    [provider]  ondansetron (ZOFRAN) 8 MG tablet Take 1 tablet (8 mg total) by mouth 2 (two) times daily as needed for refractory nausea / vomiting. Start on day 3 after chemotherapy. 01/14/18   Truitt Merle, MD  oxyCODONE (OXY IR/ROXICODONE) 5 MG immediate release  tablet Take 1 tablet (5 mg total) by mouth every 6 (six) hours as needed for severe pain. 02/19/18   Truitt Merle, MD  prochlorperazine (COMPAZINE) 10 MG tablet Take 1 tablet (10 mg total) by mouth every 6 (six) hours as needed (NAUSEA). 01/14/18   Truitt Merle, MD  simvastatin (ZOCOR) 20 MG tablet Take 20 mg by mouth at bedtime.  08/31/13   [provider]  tamsulosin (FLOMAX) 0.4 MG CAPS capsule Take 0.4 mg by mouth every evening. 12/10/17   [provider]  traMADol (ULTRAM) 50 MG tablet Take 50 mg by mouth every 6 (six) hours as needed (pain).     [provider]    Family History Family History  Problem Relation Age of Onset  . Diabetes Mother   . Breast cancer Mother 54  . Heart disease Father   . Brain cancer Maternal Grandfather     Social History Social History   Tobacco Use  . Smoking status: Former Smoker    Packs/day: 1.00    Years: 35.00    Pack years: 35.00    Types: Cigarettes    Last attempt to quit: 05/04/2013    Years since quitting: 4.8  . Smokeless tobacco: Never Used  Substance Use Topics  . Alcohol use: Yes    Alcohol/week: 4.0 standard drinks    Types: 4 Standard drinks or equivalent per week    Comment: none in 2 months, previously 1-2 drinks per week   . Drug use: No     Allergies   Patient has no known allergies.   Review of Systems Review of Systems  Constitutional: Positive for diaphoresis.  Negative for chills and fever.  Respiratory: Negative for shortness of breath.   Cardiovascular: Negative for chest pain.  Gastrointestinal: Positive for abdominal pain, constipation, nausea and vomiting.  Neurological: Positive for light-headedness.  All other systems reviewed and are negative.    Physical Exam Updated Vital Signs BP 125/64 (BP Location: Left Arm)   Pulse 84   Temp 97.8 F (36.6 C) (Oral)   Resp 14   SpO2 98%   Physical Exam  Constitutional: He appears well-developed and well-nourished. No distress.  Cachectic  HENT:  Head: Normocephalic and atraumatic.  Eyes: Conjunctivae are normal. Right eye exhibits no discharge. Left eye exhibits no discharge.  Neck: Normal range of motion. Neck supple. No JVD present. No tracheal deviation present.  Cardiovascular: Normal rate, regular rhythm and intact distal pulses.  Well-healed midline sternotomy scar. 2+ radial and DP/PT pulses bilaterally, Homans sign absent bilaterally, no lower extremity edema, no palpable cords, compartments are soft   Pulmonary/Chest: Effort normal and breath sounds normal.  Port to right chest wall with no surrounding erythema or induration  Abdominal: Soft. Bowel sounds are normal. He exhibits no distension. There is no tenderness. There is no guarding.  Musculoskeletal: He exhibits no edema.  Neurological: He is alert.  Skin: Skin is warm and dry. No erythema.  Psychiatric: His behavior is normal.  Flat affect  Nursing note and vitals reviewed.    ED Treatments / Results  Labs (all labs ordered are listed, but only abnormal results are displayed) Labs Reviewed  COMPREHENSIVE METABOLIC PANEL - Abnormal; Notable for the following components:      Result Value   Sodium 134 (*)    Chloride 97 (*)    Glucose, Bld 225 (*)    BUN 24 (*)    Total Bilirubin 1.5 (*)    All other components within  normal limits  CBC - Abnormal; Notable for the following components:   RBC 4.19 (*)     Hemoglobin 12.6 (*)    RDW 15.9 (*)    All other components within normal limits  URINALYSIS, ROUTINE W REFLEX MICROSCOPIC - Abnormal; Notable for the following components:   Glucose, UA >=500 (*)    Bacteria, UA RARE (*)    All other components within normal limits  CBG MONITORING, ED - Abnormal; Notable for the following components:   Glucose-Capillary 239 (*)    All other components within normal limits  CBG MONITORING, ED - Abnormal; Notable for the following components:   Glucose-Capillary 209 (*)    All other components within normal limits  LIPASE, BLOOD  I-STAT TROPONIN, ED    EKG ED ECG REPORT   Date: 02/21/2018  Rate: 77  Rhythm: normal sinus rhythm  QRS Axis: normal  Intervals: normal  ST/T Wave abnormalities: normal  Conduction Disutrbances:none  Narrative Interpretation:   Old EKG Reviewed: since last EKG, rate faster, more regular   I have personally reviewed the EKG tracing and agree with the computerized printout as noted.   Radiology Dg Chest 2 View  Result Date: 02/21/2018 CLINICAL DATA:  Hiccups EXAM: CHEST - 2 VIEW COMPARISON:  CT chest 01/05/2018, 03/07/2017 FINDINGS: The lungs are hyperinflated likely secondary to COPD. There is no focal parenchymal opacity. There is no pleural effusion or pneumothorax. The heart and mediastinal contours are unremarkable. There is a right-sided Port-A-Cath with the tip projecting over the SVC. There is evidence of prior CABG. The osseous structures are unremarkable. IMPRESSION: No active cardiopulmonary disease. Electronically Signed   By: Kathreen Devoid   On: 02/21/2018 15:39    Procedures Procedures (including critical care time)  Medications Ordered in ED Medications  sodium chloride 0.9 % bolus 1,000 mL (0 mLs Intravenous Stopped 02/21/18 1658)     Initial Impression / Assessment and Plan / ED Course  I have reviewed the triage vital signs and the nursing notes.  Pertinent labs & imaging results that were  available during my care of the patient were reviewed by me and considered in my medical decision making (see chart for details).     Patient presents for evaluation of nausea and vomiting, decreased appetite, and hiccups.  Recently started on insulin.  He is afebrile, vital signs are stable.  He is cachectic but nontoxic in appearance.  Chest x-ray shows no acute cardiopulmonary abnormalities.  EKG with no ischemic changes or arrhythmia.  Troponin was obtained to rule put anginal equivalent; troponin negative, and he denies any chest pain. Doubt ACS/MI. Labwork reviewed by me shows no leukocytosis, mild stable anemia, lipase and creatinine within limits.  The only acute change on his lab work is an increase in his total bilirubin which is nonspecific as the remainder of his LFTs are within normal limits.  No evidence of acute surgical abdominal pathology including cholecystitis, ascending cholangitis, obstruction, perforation, appendicitis, or dissection.  UA does not suggest UTI or nephrolithiasis.  He was given IV fluids as he appears dehydrated.  He also tolerated p.o. fluids without difficulty.  No vomiting while in the ED.  No further emergent work-up required at this time.  On reevaluation he is resting comfortably no apparent distress.  He states he is feeling much better.  Will discharge with Thorazine for hiccups, Ativan to take at night to help with sleep, and provide a small amount of hydrocodone to help the patient get through the  weekend so that he can follow-up with his oncologist on Monday.  He was also given information for palliative care resources in [place].  Patient was seen and evaluated by Dr. Kathrynn Humble who agrees with assessment and plan at this time.  Discussed strict ED return precautions.  Patient and patient's family verbalized understanding of and agreement with plan and patient stable for discharge home at this time.  Final Clinical Impressions(s) / ED Diagnoses   Final  diagnoses:  Non-intractable vomiting with nausea, unspecified vomiting type    ED Discharge Orders         Ordered    chlorproMAZINE (THORAZINE) 25 MG tablet  3 times daily PRN     02/21/18 1805    HYDROcodone-acetaminophen (NORCO/VICODIN) 5-325 MG tablet  Every 6 hours PRN     02/21/18 1805    LORazepam (ATIVAN) 1 MG tablet  Daily at bedtime     02/21/18 1805           Renita Papa, PA-C 02/21/18 1925    Varney Biles, MD 02/22/18 [phone]

## 2018-02-21 NOTE — Progress Notes (Signed)
Upon arrival, pt's family member states that they are wanting a refill prescription for his home hydrocodone-acetaminophen. This RN stated that refill prescriptions usually have to wait until [facility] business hours. At the patient's request, on-call MD contacted. On-call MD denied request to refill any home medication at this time. Pt and family member notified and verbalized understanding. This RN suggested they contact MD Burr Medico on Monday for refill.   Pt became sick after being deaccessed from infusion pump. Patient vomited several times and became very light-headed stating "I feel like I'm going to pass out". Patient and family member report concerns for his pain and nausea at home. This RN explained that further intervention would require a visit to the ED. Patient and his family member are agreeable to go to the [facility] ED. Patient transported via wheelchair to the ED. At departure, Pt VSS LOC X 3.

## 2018-02-23 ENCOUNTER — Telehealth: Payer: Self-pay

## 2018-02-23 ENCOUNTER — Other Ambulatory Visit: Payer: Self-pay | Admitting: Hematology

## 2018-02-23 MED ORDER — HYDROCODONE-ACETAMINOPHEN 5-325 MG PO TABS: 1.0000 | ORAL_TABLET | Freq: Four times a day (QID) | ORAL | 0 refills | Status: DC | PRN

## 2018-02-23 NOTE — Telephone Encounter (Signed)
Spoke with patient regarding his current status after having to go to the ED over the weekend. He states he is no longer vomiting, offered to come in for IVF, does not feel he needs to presently, Does need more of hydrocodone sent into [place], Dr. Burr Medico aware.  This is helping to manage his pain by alternating the hydrocodone and oxycodone.   Instructed patient to call back if symptoms worsen, he verbalized an understanding.

## 2018-02-24 ENCOUNTER — Telehealth: Payer: Self-pay

## 2018-02-24 ENCOUNTER — Inpatient Hospital Stay (HOSPITAL_COMMUNITY): Admission: RE | Admit: 2018-02-24 | Payer: Medicare Other | Source: Ambulatory Visit

## 2018-02-24 ENCOUNTER — Encounter (HOSPITAL_COMMUNITY): Payer: Medicare Other

## 2018-02-24 NOTE — Telephone Encounter (Signed)
Received a phone call daughter, Caryl Pina, to schedule visit. Scheduled for tomorrow @ 10am.

## 2018-02-24 NOTE — Telephone Encounter (Signed)
Returned call to daughter, Caryl Pina, to confirm that referral was received and offer to schedule visit with NP. VM left

## 2018-02-25 ENCOUNTER — Other Ambulatory Visit: Payer: Medicare Other | Admitting: Primary Care

## 2018-02-25 DIAGNOSIS — C61 Malignant neoplasm of prostate: Secondary | ICD-10-CM

## 2018-02-25 DIAGNOSIS — Z515 Encounter for palliative care: Secondary | ICD-10-CM

## 2018-02-25 DIAGNOSIS — Z7189 Other specified counseling: Secondary | ICD-10-CM

## 2018-02-25 DIAGNOSIS — C251 Malignant neoplasm of body of pancreas: Secondary | ICD-10-CM

## 2018-02-25 NOTE — Progress Notes (Signed)
PALLIATIVE CARE CONSULT VISIT   PATIENT NAME: Michael Maddox DOB: 12-Oct-1950 MRN: [id number]  PRIMARY CARE PROVIDER:   Nolene Ebbs, MD  REFERRING PROVIDER:  Nolene Ebbs, MD [place], [place] [address]  RESPONSIBLE PARTY:   Wife Kathie Rhodes and self  ASSESSMENT and RECOMMENDATIONS:  1. Constipation: Lactulose 30 ml q 2 hrs until bm, called in prescription.  Can d/c other laxatives if lactuose is effective.   2. Nutrition: BMI 17.  Increased Mirtazipine 15 mg nightly for appetite. Taking boost supplements, will get glucose control type. Taking bg readings which have been labile (100s to 300's). Mirtazipine called in as he was soon out of 7.5 mg tabs. Instructed if too lethargic to return to 7.5 mg.   3. Nausea: Encouraged to use prescribed thorazine, ondansetron and/or compazine in the home. Prescription for thorazine for hiccups given but family stated they wanted to discuss possible interaction with chemo. Reassured that this drug would be helpful for intractable hiccups and could be given.  4. Pain: Reports 7/10.  Consider Oxycontin 30 mg q 12 hour with  Oxycodone 5 mg 4 hrs for break through pain. Then d/c hydrocodone. Can keep taking acetaminophen 650 mg every 6 hours if helpful. PCP to manage narcotic prescription. Family understands and will discuss at appointment 02/26/18.  4. Fatigue: Poor sleep, mirtazipine 15 mg q hs may help, see above.   5.Personal Care: Home Health referral discussed and family does not feel that is what he needs at this time. Would like to continue with palliative management at this time.  Next chemo due 11/14. PCP  appt 11/7.    6. Goals of Care: Declined to discuss hospice or advance directives. Broached possibility of chemo not being able to be continued due to nausea and debility. Neither patient or the many family in the meeting wanted to discuss this possibility. Reiterated plan for continued chemotherapy over next month.  Literature about hospice services left. Patient is hospice appropriate at this time if he decides to stop or cannot tolerate chemotherapy. If he continues, palliative can follow for symptom management.  Will continue to follow, evey 1-2 weeks to recommend symptom management, continue to discuss goals of care.  I spent 60 minutes providing this consultation,  from 10:00 to 11:00 . More than 50% of the time in this consultation was spent coordinating communication.   HISTORY OF PRESENT ILLNESS:  Michael Maddox is a 67 y.o. year old male with multiple medical problems including pancreatic cancer, prostate cancer, carotid artery disease, COPD. Palliative Care was asked to help address goals of care.   CODE STATUS: FULL CODE  PPS: 40% HOSPICE ELIGIBILITY/DIAGNOSIS: Pancreatic CA, appropriate with cessation of curative measures.  PAST MEDICAL HISTORY:  Past Medical History:  Diagnosis Date  . Arthritis   . Carotid artery disease ([place]) followed by dr berry--- per last duplex [id number]  LICA 15-72%, bilateral ECA >50%,  patent RICA post intervention   hx acute high grade stenosis RICA >80% 02-22-2009 in setting of  stroke  s/p  right carotid endarterectomy 02-28-2009/    . Claudication of both lower extremities ([place])    due to PAD ----  right > left  . COPD (chronic obstructive pulmonary disease) ([place])   . Coronary artery disease CARDIOLOGIST-  DR BERRY   hx positive myoview 07-29-2013;  08-16-2013 per cardiac cath, severe 3V CAD w/ 60% subclavian stenosis;  08-23-2013   s/p  CABG x3  (LIMA to LAD, SVG to OM1,  SVG to PDA)  . CVA (cerebral vascular accident) ([place]) 02/22/2009   acute infarct right hemisphere w/ severe RICA stentosis----  per pt no residuals  . Family history of brain cancer   . Family history of breast cancer   . GERD (gastroesophageal reflux disease)   . HTN (hypertension)   . Hyperlipidemia   . Hyperplasia of prostate with lower urinary tract symptoms (LUTS)   . Peripheral  arterial disease ([place]) followed by dr berry--- last dobbler ABIs bilateral SFA occlusions (pt is scheduled for intervention 05-08-2017   hx PTA and stenting to bilateral SFA 2002 and PTA w/ stenting left common iliac artery and for in-stent restenosis 2003 Left SFA and 2005  stenting right SFA for in-stent restenosis  . Prostate cancer [facility]) urologist-  dr ottelin/  oncologist-  dr Tammi Klippel   dx 11-20-2016-- Stage T1c, Gleason 4+3, PSA 5.73, vol 35.95cc--- scheduled for radioactive seed implants 04-11-2017  . 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 S/P peripheral artery angioplasty with stent placement    bilateral SFA 2002;  in-stenosis bilateral SFA  (right 2003, left 2005) and left CIA 2003  . Shortness of breath   . Type II diabetes mellitus ([place])   . Weak urinary stream   . Wears glasses     SOCIAL HX:  Social History   Tobacco Use  . Smoking status: Former Smoker    Packs/day: 1.00    Years: 35.00    Pack years: 35.00    Types: Cigarettes    Last attempt to quit: 05/04/2013    Years since quitting: 4.8  . Smokeless tobacco: Never Used  Substance Use Topics  . Alcohol use: Yes    Alcohol/week: 4.0 standard drinks    Types: 4 Standard drinks or equivalent per week    Comment: none in 2 months, previously 1-2 drinks per week     ALLERGIES: No Known Allergies   PERTINENT MEDICATIONS:  Outpatient Encounter Medications as of 02/25/2018  Medication Sig  . aspirin 81 MG chewable tablet Chew 1 tablet (81 mg total) by mouth daily.  . chlorproMAZINE (THORAZINE) 25 MG tablet Take 1 tablet (25 mg total) by mouth 3 (three) times daily as needed for hiccoughs.  . cilostazol (PLETAL) 50 MG tablet Take 1 tablet (50 mg total) by mouth 2 (two) times daily.  . clopidogrel (PLAVIX) 75 MG tablet Take 75 mg by mouth at bedtime.   Marland Kitchen dexlansoprazole (DEXILANT) 60 MG capsule Take 60 mg by mouth daily as needed (for reflux.).   Marland Kitchen doxycycline (VIBRAMYCIN) 100 MG capsule Take  100 mg by mouth daily as needed for itching.  Marland Kitchen HYDROcodone-acetaminophen (NORCO/VICODIN) 5-325 MG tablet Take 1 tablet by mouth every 6 (six) hours as needed for severe pain.  . hydrocortisone (ANUSOL-HC) 2.5 % rectal cream Place 1 application rectally 2 (two) times daily as needed for hemorrhoids.  . hydrocortisone 2.5 % ointment Apply 1 application topically 2 (two) times daily as needed (for skin irritation).   Marland Kitchen lidocaine-prilocaine (EMLA) cream Apply 1 application topically as needed (local anesthesia).  . LORazepam (ATIVAN) 1 MG tablet Take 1 tablet (1 mg total) by mouth at bedtime.  . metFORMIN (GLUCOPHAGE) 500 MG tablet Take 500 mg by mouth at bedtime.   . metoprolol succinate (TOPROL-XL) 25 MG 24 hr tablet Take 25 mg by mouth at bedtime.   . mirtazapine (REMERON) 7.5 MG tablet Take 1 tablet (7.5 mg total) by mouth  at bedtime.  . Multiple Vitamins-Minerals (MULTIVITAMIN WITH MINERALS) tablet Take 1 tablet by mouth daily.  . ondansetron (ZOFRAN) 8 MG tablet Take 1 tablet (8 mg total) by mouth 2 (two) times daily as needed for refractory nausea / vomiting. Start on day 3 after chemotherapy.  Marland Kitchen oxyCODONE (OXY IR/ROXICODONE) 5 MG immediate release tablet Take 1 tablet (5 mg total) by mouth every 6 (six) hours as needed for severe pain.  Marland Kitchen prochlorperazine (COMPAZINE) 10 MG tablet Take 1 tablet (10 mg total) by mouth every 6 (six) hours as needed (NAUSEA).  . simvastatin (ZOCOR) 20 MG tablet Take 20 mg by mouth at bedtime.   . tamsulosin (FLOMAX) 0.4 MG CAPS capsule Take 0.4 mg by mouth every evening.  . traMADol (ULTRAM) 50 MG tablet Take 50 mg by mouth every 6 (six) hours as needed (pain).    Facility-Administered Encounter Medications as of 02/25/2018  Medication  . sodium chloride flush (NS) 0.9 % injection 10 mL    PHYSICAL EXAM:  VS 98.2-70-18  Sitting 148/78, PO2 95% RA Standing- very dizzy, sat before registered 113/65 General: ill appearing, very frail appearing,  thin Cardiovascular: regular rate and rhythm, S1 S2 S4 Pulmonary: clear but diminished all fields Abdomen: soft, nontender, + bowel sounds. Endorses frequent constipation Extremities: no edema, no joint deformities Skin: no rashes, skin dry Neurological: Weakness but otherwise nonfocal, denies falls but endorses dizziness.  Cyndia Skeeters DNP, AGPCNP-BC

## 2018-02-26 DIAGNOSIS — I10 Essential (primary) hypertension: Secondary | ICD-10-CM | POA: Diagnosis not present

## 2018-02-26 DIAGNOSIS — E1165 Type 2 diabetes mellitus with hyperglycemia: Secondary | ICD-10-CM | POA: Diagnosis not present

## 2018-02-26 DIAGNOSIS — R630 Anorexia: Secondary | ICD-10-CM | POA: Diagnosis not present

## 2018-02-26 DIAGNOSIS — C251 Malignant neoplasm of body of pancreas: Secondary | ICD-10-CM | POA: Diagnosis not present

## 2018-03-05 ENCOUNTER — Inpatient Hospital Stay: Payer: Medicare Other

## 2018-03-05 ENCOUNTER — Inpatient Hospital Stay (HOSPITAL_BASED_OUTPATIENT_CLINIC_OR_DEPARTMENT_OTHER): Payer: Medicare Other | Admitting: Hematology

## 2018-03-05 ENCOUNTER — Encounter: Payer: Self-pay | Admitting: Hematology

## 2018-03-05 ENCOUNTER — Telehealth: Payer: Self-pay | Admitting: Hematology

## 2018-03-05 VITALS — BP 115/85 | HR 79 | Temp 97.6°F | Resp 17 | Ht 71.0 in | Wt 117.4 lb

## 2018-03-05 DIAGNOSIS — C251 Malignant neoplasm of body of pancreas: Secondary | ICD-10-CM | POA: Diagnosis not present

## 2018-03-05 DIAGNOSIS — R634 Abnormal weight loss: Secondary | ICD-10-CM | POA: Diagnosis not present

## 2018-03-05 DIAGNOSIS — Z87891 Personal history of nicotine dependence: Secondary | ICD-10-CM

## 2018-03-05 DIAGNOSIS — R110 Nausea: Secondary | ICD-10-CM | POA: Diagnosis not present

## 2018-03-05 DIAGNOSIS — F329 Major depressive disorder, single episode, unspecified: Secondary | ICD-10-CM

## 2018-03-05 DIAGNOSIS — Z95828 Presence of other vascular implants and grafts: Secondary | ICD-10-CM

## 2018-03-05 DIAGNOSIS — C61 Malignant neoplasm of prostate: Secondary | ICD-10-CM | POA: Diagnosis not present

## 2018-03-05 LAB — CBC WITH DIFFERENTIAL (CANCER CENTER ONLY)
ABS IMMATURE GRANULOCYTES: 0.05 10*3/uL (ref 0.00–0.07)
Basophils Absolute: 0 10*3/uL (ref 0.0–0.1)
Basophils Relative: 0 %
Eosinophils Absolute: 0.1 10*3/uL (ref 0.0–0.5)
Eosinophils Relative: 1 %
HCT: 33.5 % — ABNORMAL LOW (ref 39.0–52.0)
HEMOGLOBIN: 11.1 g/dL — AB (ref 13.0–17.0)
Immature Granulocytes: 1 %
Lymphocytes Relative: 12 %
Lymphs Abs: 0.9 10*3/uL (ref 0.7–4.0)
MCH: 31.5 pg (ref 26.0–34.0)
MCHC: 33.1 g/dL (ref 30.0–36.0)
MCV: 95.2 fL (ref 80.0–100.0)
MONO ABS: 0.6 10*3/uL (ref 0.1–1.0)
MONOS PCT: 7 %
NEUTROS ABS: 6.3 10*3/uL (ref 1.7–7.7)
Neutrophils Relative %: 79 %
Platelet Count: 245 10*3/uL (ref 150–400)
RBC: 3.52 MIL/uL — AB (ref 4.22–5.81)
RDW: 15.6 % — ABNORMAL HIGH (ref 11.5–15.5)
WBC: 8 10*3/uL (ref 4.0–10.5)
nRBC: 0 % (ref 0.0–0.2)

## 2018-03-05 LAB — CMP (CANCER CENTER ONLY)
ALBUMIN: 3.8 g/dL (ref 3.5–5.0)
ALT: 14 U/L (ref 0–44)
AST: 15 U/L (ref 15–41)
Alkaline Phosphatase: 77 U/L (ref 38–126)
Anion gap: 10 (ref 5–15)
BUN: 10 mg/dL (ref 8–23)
CO2: 26 mmol/L (ref 22–32)
CREATININE: 0.8 mg/dL (ref 0.61–1.24)
Calcium: 9.2 mg/dL (ref 8.9–10.3)
Chloride: 104 mmol/L (ref 98–111)
GFR, Est AFR Am: >60 mL/min (ref >60)
GFR, Estimated: >60 mL/min (ref >60)
GLUCOSE: 196 mg/dL — AB (ref 70–99)
POTASSIUM: 4.1 mmol/L (ref 3.5–5.1)
Sodium: 140 mmol/L (ref 135–145)
Total Bilirubin: 0.3 mg/dL (ref 0.3–1.2)
Total Protein: 6.6 g/dL (ref 6.5–8.1)

## 2018-03-05 MED ORDER — SODIUM CHLORIDE 0.9 % IV SOLN
2400.0000 mg/m2 | INTRAVENOUS | Status: DC
  Administered 2018-03-05: 4050 mg via INTRAVENOUS
  Filled 2018-03-05: qty 81

## 2018-03-05 MED ORDER — DEXAMETHASONE 4 MG PO TABS: ORAL_TABLET | ORAL | 1 refills | Status: AC

## 2018-03-05 MED ORDER — DEXTROSE 5 % IV SOLN
Freq: Once | INTRAVENOUS | Status: AC
  Administered 2018-03-05: 13:00:00 via INTRAVENOUS
  Filled 2018-03-05: qty 250

## 2018-03-05 MED ORDER — PROCHLORPERAZINE MALEATE 10 MG PO TABS: 10.0000 mg | ORAL_TABLET | Freq: Four times a day (QID) | ORAL | 1 refills | Status: DC | PRN

## 2018-03-05 MED ORDER — DEXAMETHASONE SODIUM PHOSPHATE 10 MG/ML IJ SOLN
10.0000 mg | Freq: Once | INTRAMUSCULAR | Status: AC
  Administered 2018-03-05: 10 mg via INTRAVENOUS

## 2018-03-05 MED ORDER — SODIUM CHLORIDE 0.9% FLUSH
10.0000 mL | INTRAVENOUS | Status: DC | PRN
  Administered 2018-03-05: 10 mL
  Filled 2018-03-05: qty 10

## 2018-03-05 MED ORDER — PALONOSETRON HCL INJECTION 0.25 MG/5ML
0.2500 mg | Freq: Once | INTRAVENOUS | Status: AC
  Administered 2018-03-05: 0.25 mg via INTRAVENOUS

## 2018-03-05 MED ORDER — PALONOSETRON HCL INJECTION 0.25 MG/5ML
INTRAVENOUS | Status: AC
  Filled 2018-03-05: qty 5

## 2018-03-05 MED ORDER — DEXTROSE 5 % IV SOLN
Freq: Once | INTRAVENOUS | Status: AC
  Administered 2018-03-05: 17:00:00 via INTRAVENOUS
  Filled 2018-03-05: qty 250

## 2018-03-05 MED ORDER — ATROPINE SULFATE 1 MG/ML IJ SOLN
0.5000 mg | Freq: Once | INTRAMUSCULAR | Status: AC | PRN
  Administered 2018-03-05: 0.5 mg via INTRAVENOUS

## 2018-03-05 MED ORDER — LEUCOVORIN CALCIUM INJECTION 350 MG
400.0000 mg/m2 | Freq: Once | INTRAVENOUS | Status: AC
  Administered 2018-03-05: 676 mg via INTRAVENOUS
  Filled 2018-03-05: qty 33.8

## 2018-03-05 MED ORDER — IRINOTECAN HCL CHEMO INJECTION 100 MG/5ML
130.0000 mg/m2 | Freq: Once | INTRAVENOUS | Status: AC
  Administered 2018-03-05: 220 mg via INTRAVENOUS
  Filled 2018-03-05: qty 11

## 2018-03-05 MED ORDER — ATROPINE SULFATE 1 MG/ML IJ SOLN
INTRAMUSCULAR | Status: AC
  Filled 2018-03-05: qty 1

## 2018-03-05 MED ORDER — MORPHINE SULFATE ER 15 MG PO TBCR: 15.0000 mg | EXTENDED_RELEASE_TABLET | Freq: Two times a day (BID) | ORAL | 0 refills | Status: DC

## 2018-03-05 MED ORDER — OXALIPLATIN CHEMO INJECTION 100 MG/20ML
70.0000 mg/m2 | Freq: Once | INTRAVENOUS | Status: AC
  Administered 2018-03-05: 120 mg via INTRAVENOUS
  Filled 2018-03-05: qty 20

## 2018-03-05 MED ORDER — DEXAMETHASONE SODIUM PHOSPHATE 10 MG/ML IJ SOLN
INTRAMUSCULAR | Status: AC
  Filled 2018-03-05: qty 1

## 2018-03-05 NOTE — Telephone Encounter (Signed)
Per 11/14 los CT abd/pel w contrast in 4 weeks.

## 2018-03-05 NOTE — Progress Notes (Signed)
Spoke with [employer] of [place] initiated [place] for MS Contin, provided peer clinical information, marked Urgent should hear within 24 hours. Should be approved according to [employer] at [place]. Let patient know.   Received notification this has been approved for one year.   Faxed note to Dr. Nolene Ebbs (PCP) that Dr. Burr Medico will be responsible for prescribing his pain medications.

## 2018-03-05 NOTE — Progress Notes (Signed)
[name]  [phone] [phone]  Clinic Follow up Note   Patient Care Team: Nolene Ebbs, MD as PCP - General (Internal Medicine) Lorretta Harp, MD as PCP - Cardiology (Cardiology) Carol Ada, MD as Consulting Physician (Gastroenterology) Truitt Merle, MD as Consulting Physician (Hematology)   Date of Service:  03/05/2018   CHIEF COMPLAINTS:  F/u on Adenocarcinoma of pancreas, unresectable   SUMMARY OF ONCOLOGIC HISTORY: Oncology History   Cancer Staging Malignant neoplasm of body of pancreas [facility]) Staging form: Exocrine Pancreas, AJCC 8th Edition - Clinical stage from 12/19/2017: Stage IIA (cT3, cN0, cM0) - Signed by Truitt Merle, MD on 01/13/2018       Malignant neoplasm of body of pancreas ([place])   12/11/2017 Imaging    CT AP WITH CONTRAST IMPRESSION: Focal area of abnormal low attenuation in the pancreatic body measuring 3.7 x 2.9 cm with cut off of the duct most concerning for primary pancreatic neoplasm.  2 small low-attenuation foci identified within the liver which are nonspecific however could represent metastatic disease; one is 1.0 cm complex lesion within the hepatic dome, the other area is a low-attenuation focus adjacent to the falciform ligament likely representing a perfusion anomaly or possible local fatty infiltration  Extensive atherosclerotic changes in the abdominal aorta, iliac vessels, splenic vessels.  Occlusion of the right and possibly left iliac.  Suspected chronic re-constitution of flow identified within the femoral vessels bilaterally.  Prostate brachytherapy consistent with history of malignancy  No evidence of small bowel obstruction.  Findings suggestive of constipation     12/19/2017 Procedure    EUS biopsy: In the body of the pancreas there was a large 20 x 30 mm, approximately, hypoechoic mass. It had irregular margin, but it was round/oval in appearance. There was pancreatic tail atrophy, but the  PD was dilated up to 7.6 mm. The celiac axis was clearly viewed and there was no evidence of invasion. In the head of the pancreas a 1 cm lymph node was identified and the pancreas was lobular in the head and uncinate process. The left adrenal was clearly identified and it was enlarged, but there was no evidence of any masses.    12/19/2017 Initial Biopsy    Diagnosis FINE NEEDLE ASPIRATION,ENDOSCOPIC, PANCREAS BODY (SPECIMEN 1 OF 1 COLLECTED 12/19/17): MALIGNANT CELLS CONSISTENT WITH ADENOCARCINOMA.    12/19/2017 Initial Diagnosis    Malignant neoplasm of body of pancreas ([place])    12/19/2017 Cancer Staging    Staging form: Exocrine Pancreas, AJCC 8th Edition - Clinical stage from 12/19/2017: Stage IIA (cT3, cN0, cM0) - Signed by Truitt Merle, MD on 01/13/2018    01/05/2018 Imaging    01/05/2018 MRI Abdomen IMPRESSION: 1. Poorly marginated hypoenhancing 4.2 x 2.5 x 5.2 cm pancreatic body mass with pancreatic tail atrophy and duct dilation, compatible with known pancreatic adenocarcinoma. Pancreatic tumor appears locally advanced with significant vascular involvement as detailed. No biliary ductal dilatation. 2. No abdominal adenopathy. 3. Two similar subcentimeter enhancing left liver lobe masses as detailed, which are too small to accurately characterize on this mildly motion degraded scan. Differential includes small metastases or hemangiomas. Suggest follow-up MRI abdomen without and with IV contrast in 3 months. These lesions are probably too small to characterize by PET-CT. 4.  Aortic Atherosclerosis (ICD10-I70.0).    01/06/2018 Imaging    01/06/2018 CT Chest IMPRESSION: 1. Two non-specific small left lower lobe pulmonary nodules. Otherwise, no evidence of metastatic disease within the chest. 2.  Aortic Atherosclerosis (ICD10-I70.0).  Emphysema (ICD10-J43.9).    01/13/2018 Tumor Marker    Tumor Marker Results for DAMON, HARGROVE (MRN [id number]) as of 01/13/2018 09:17  Ref. Range  12/30/2017 14:59  CA 19-9 Latest Ref Range: 0 - 35 U/mL 1,263 (H)      01/21/2018 -  Chemotherapy    FOLFIRINOX every 2 weeks starting 01/21/18. Increased to full dose oxaliplatin on cycle 2    02/03/2018 Genetic Testing    Negative genetic testing on the [facility] Multi-Cancer Panel + Brain Cancer Panel. The Multi-Cancer Panel offered by [facility] includes sequencing and/or deletion duplication testing of the following 84 genes: AIP, ALK, APC, ATM, AXIN2,BAP1,  BARD1, BLM, BMPR1A, BRCA1, BRCA2, BRIP1, CASR, CDC73, CDH1, CDK4, CDKN1B, CDKN1C, CDKN2A (p14ARF), CDKN2A (p16INK4a), CEBPA, CHEK2, CTNNA1, DICER1, DIS3L2, EGFR (c.2369C>T, p.Thr790Met variant only), EPCAM (Deletion/duplication testing only), FH, FLCN, GATA2, GPC3, GREM1 (Promoter region deletion/duplication testing only), HOXB13 (c.251G>A, p.Gly84Glu), HRAS, KIT, MAX, MEN1, MET, MITF (c.952G>A, p.Glu318Lys variant only), MLH1, MSH2, MSH3, MSH6, MUTYH, NBN, NF1, NF2, NTHL1, PALB2, PDGFRA, PHOX2B, PMS2, POLD1, POLE, POT1, PRKAR1A, PTCH1, PTEN, RAD50, RAD51C, RAD51D, RB1, RECQL4, RET, RUNX1, SDHAF2, SDHA (sequence changes only), SDHB, SDHC, SDHD, SMAD4, SMARCA4, SMARCB1, SMARCE1, STK11, SUFU, TERC, TERT, TMEM127, TP53, TSC1, TSC2, VHL, WRN and WT1.  The CNS/Brain Cancer Panel offered by [facility] includes sequencing and/or deletion duplication testing of the following 42 genes: ALK, APC, BAP1, BARD1, CDK4, CDKN2A, DICER1, EPCAM, EZH2, GPC3, HRAS, KIF1B, MEN1, MLH1, MSH2, MSH6, NF1, NF2, PHOX2B, PMS2, POT1, PRKAR1A, PTCH2, PTEN, RB1, SMARCA4, SMARCB1, SMARCE1, SUFU, TP53, TSC1, TSC2, and VHL.   The report date is 02/03/2018.     HISTORY OF PRESENTING ILLNESS:  (According to NP Lacie on 12/30/2017) Michael Maddox 67 y.o. male is here at the request of Dr. Carol Ada. He presented to PCP Dr. Jeanie Cooks c/o 1.5 - 2 month history of abdominal pain, constipation, and 30 lbs weight loss that gradually worsened over time. There was associated epigastric pain. He  underwent CT AP with contrast on 12/11/17 that showed focal area of enlargement involving the pancreatic body measuring approximately 3.7 x 2.9 cm concerning for primary pancreatic neoplasm. Two small lesions were noted in the liver which are indeterminate.  He underwent EUS biopsy on 12/19/2017 per Dr. Benson Norway which showed a large 20 x 30 mm hypoechoic mass in the body of the pancreas with irregular margins.  There was pancreatic tail atrophy and pancreatic duct dilatation up to 7.6 mm.  In the head of the pancreas a 1 cm lymph node was identified.  Biopsy of the pancreas body mass was positive for malignant cells consistent with adenocarcinoma.   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 Tammi Klippel on 04/11/18, followed by Dr. Karsten Ro. HTN, HL, TIA in 2010 s/p endarterectomy, CAD s/p CABG in 2015, PVD requiring revascularization and stenting in the LEs and on anticoagulation with plavix and aspirin; followed by cardiologist Dr. [name]. He is recently diagnosed with DM on metformin, most recent BG 151, and A1c "6.?"   Socially, he He is a former smoker 1 PPD x35 years, quit in 2015; denies drug use or current alcohol use. He quit drinking 2 months ago and before that he drank 1-2 drinks per week. 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 Drives himself and is mostly independent with ADLs. He previously was not very physically active due to claudication.  He has family history of  breast cancer in his mother diagnosed at age 26. Denies other breast, gyn, or colon cancers. He has 3 children who are healthy.   Today, he reports mild fatigue and decreased appetite that began after his prostate cancer treatment that has gradually worsened over 3-4 months. He has lost 30 lbs, drinks Glucerna occasionally. He has occasional post-prandial emesis and constant band-like pain across his abdomen, he rates 5/10. He  has taken percocet which he does not find very helpful. He has constipation, but when he does have BM it is light colored and floats, with found odor. Denies dark urine, juandice, fever, chills, diarrhea, cough, chest pain, dyspnea, or leg edema.   CURRENT THERAPY: first line chemo FOLFIRINOX every 2 weeks starting 01/21/18, reduced irinotecan and oxaliplatin dose from cycle 3 due to poor tolerance.    INTERVAL HISTORY:  HENSLEY TREAT is a 67 y.o. male who is here for follow-up accompanied by his daughter. He notes after cycle 3 he went to ED for nausea and vomiting. After discharge he notes to feel better. He notes he is eating well and on home scale he gained weight from last week. He notes with lower dose chemo he tolerated much better.  He takes oxycodone and hydrocodone alternating every 3 hours and tolerating them the same. He has OxyContin called in but it has not been filled. His pain would be a 7 without any medication. He notes his glucose is doing fine. His PCP recently put him on insulin and his glucose has been fluctuating.    REVIEW OF SYSTEMS:   Constitutional: Denies fevers, chills (+) slight weight loss, stable Eyes: Denies blurriness of vision Ears, nose, mouth, throat, and face: Denies mucositis or sore throat Respiratory: Denies cough, dyspnea or wheezes Cardiovascular: Denies palpitation, chest discomfort or lower extremity swelling Gastrointestinal:  Denies nausea, heartburn (+) abdominal pain, midabdominal and radiates to his right side and back (+) constipation Skin: Denies abnormal skin rashes Lymphatics: Denies new lymphadenopathy or easy bruising Neurological:Denies numbness, tingling or new weaknesses Behavioral/Psych: Mood is stable, no new changes  All other systems were reviewed with the patient and are negative.  MEDICAL HISTORY:  Past Medical History:  Diagnosis Date  . Arthritis   . Carotid artery disease ([place]) followed by dr berry--- per last duplex  [id number]  LICA 24-82%, bilateral ECA >50%,  patent RICA post intervention   hx acute high grade stenosis RICA >80% 02-22-2009 in setting of  stroke  s/p  right carotid endarterectomy 02-28-2009/    . Claudication of both lower extremities ([place])    due to PAD ----  right > left  . COPD (chronic obstructive pulmonary disease) ([place])   . Coronary artery disease CARDIOLOGIST-  DR BERRY   hx positive myoview 07-29-2013;  08-16-2013 per cardiac cath, severe 3V CAD w/ 60% subclavian stenosis;  08-23-2013   s/p  CABG x3  (LIMA to LAD, SVG to OM1, SVG to PDA)  . CVA (cerebral vascular accident) ([place]) 02/22/2009   acute infarct right hemisphere w/ severe RICA stentosis----  per pt no residuals  . Family history of brain cancer   . Family history of breast cancer   . GERD (gastroesophageal reflux disease)   . HTN (hypertension)   . Hyperlipidemia   . Hyperplasia of prostate with lower urinary tract symptoms (LUTS)   . Peripheral arterial disease ([place]) followed by dr berry--- last dobbler ABIs bilateral SFA occlusions (pt is scheduled for intervention 05-08-2017   hx PTA and stenting to  bilateral SFA 2002 and PTA w/ stenting left common iliac artery and for in-stent restenosis 2003 Left SFA and 2005  stenting right SFA for in-stent restenosis  . Prostate cancer [facility]) urologist-  dr ottelin/  oncologist-  dr Tammi Klippel   dx 11-20-2016-- Stage T1c, Gleason 4+3, PSA 5.73, vol 35.95cc--- scheduled for radioactive seed implants 04-11-2017  . 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 S/P peripheral artery angioplasty with stent placement    bilateral SFA 2002;  in-stenosis bilateral SFA  (right 2003, left 2005) and left CIA 2003  . Shortness of breath   . Type II diabetes mellitus ([employer])   . Weak urinary stream   . Wears glasses     SURGICAL HISTORY: Past Surgical History:  Procedure Laterality Date  . ABDOMINAL AORTOGRAM N/A 05/08/2017   Procedure: ABDOMINAL AORTOGRAM;  Surgeon:  Lorretta Harp, MD;  Location: [place] CV LAB;  Service: Cardiovascular;  Laterality: N/A;  . CARDIAC CATHETERIZATION  12-01-2000    dr berry    non-critial CAD & nonischemic cardiolite-- mLAD 50%, mLCx 70%, prox. to mid RCA 75% focal midsegment 80-85%  . CARDIOVASCULAR STRESS TEST  07-29-2013    dr berry   High risk nuclear study w/ inferoseptal ischemia/  normal LV function and wall motion,  nuclear study ef 52%  . CAROTID ENDARTERECTOMY Right 02-28-2009    dr c. Scot Dock  [facility]   patch angioplasty  . COLONOSCOPY    . CORONARY ARTERY BYPASS GRAFT N/A 08/23/2013   Procedure: CORONARY ARTERY BYPASS GRAFTING (CABG);  Surgeon: Melrose Nakayama, MD;  Location: [place];  Service: Open Heart Surgery;  Laterality: N/A;  Coronary Artery Bypass graft times three using left internal mammary artery and left leg saphenous vein   . CYSTOSCOPY N/A 04/11/2017   Procedure: CYSTOSCOPY FLEXIBLE;  Surgeon: Kathie Rhodes, MD;  Location: [facility];  Service: Urology;  Laterality: N/A;  no seeds found in bladder  . ENDOVASCULAR STENT INSERTION Bilateral right 12-25-2000 ;  left 03-02-2001   dr berry   PTA and stenting to bilateral SFA  . ENDOVASCULAR STENT INSERTION Bilateral right 08-30-2003 ;  left 11-10-2001    dr berry   PTA and stenting for in-stent restenosis right SFA 08-30-2003:  PTA and stenting distal left common iliac artery and in-stent restenosis left SFA 11-10-2001  . FINE NEEDLE ASPIRATION N/A 12/19/2017   Procedure: FINE NEEDLE ASPIRATION (FNA) LINEAR;  Surgeon: Carol Ada, MD;  Location: [facility];  Service: Endoscopy;  Laterality: N/A;  . INTRAOPERATIVE TRANSESOPHAGEAL ECHOCARDIOGRAM N/A 08/23/2013   Procedure: INTRAOPERATIVE TRANSESOPHAGEAL ECHOCARDIOGRAM;  Surgeon: Melrose Nakayama, MD;  Location: [place];  Service: Open Heart Surgery;  Laterality: N/A;  . IR IMAGING GUIDED PORT INSERTION  01/20/2018  . LEFT HEART CATHETERIZATION WITH CORONARY ANGIOGRAM N/A 08/16/2013    Procedure: LEFT HEART CATHETERIZATION WITH CORONARY ANGIOGRAM;  Surgeon: Lorretta Harp, MD;  Location: [facility] CATH LAB;  Service: Cardiovascular;  Laterality: N/A;   severe 3V CAD and 60% subclavian sternosis  . LOWER EXTREMITY ANGIOGRAM N/A 08/16/2013   Procedure: LOWER EXTREMITY ANGIOGRAM;  Surgeon: Lorretta Harp, MD;  Location: [facility] CATH LAB;  Service: Cardiovascular;  Laterality: N/A;  . LOWER EXTREMITY ANGIOGRAPHY Bilateral 05/08/2017   Procedure: Lower Extremity Angiography;  Surgeon: Lorretta Harp, MD;  Location: [place] [facility];  Service: Cardiovascular;  Laterality: Bilateral;  . PERIPHERAL VASCULAR BALLOON ANGIOPLASTY Left 05/08/2017   Procedure: PERIPHERAL  VASCULAR BALLOON ANGIOPLASTY;  Surgeon: Lorretta Harp, MD;  Location: [place] CV LAB;  Service: Cardiovascular;  Laterality: Left;  COMMON ILIAC  . RADIOACTIVE SEED IMPLANT N/A 04/11/2017   Procedure: RADIOACTIVE SEED IMPLANT/BRACHYTHERAPY IMPLANT;  Surgeon: Kathie Rhodes, MD;  Location: [facility];  Service: Urology;  Laterality: N/A;    67   seeds implanted  . SPACE OAR INSTILLATION N/A 04/11/2017   Procedure: SPACE OAR INSTILLATION;  Surgeon: Kathie Rhodes, MD;  Location: [facility];  Service: Urology;  Laterality: N/A;  . TRANSTHORACIC ECHOCARDIOGRAM  07/15/2013   ef 44-81%, grade 1 diastolic dysfunction/  trivial MR, mild to moderate PR, mild TR   . UPPER ESOPHAGEAL ENDOSCOPIC ULTRASOUND (EUS) N/A 12/19/2017   Procedure: UPPER ESOPHAGEAL ENDOSCOPIC ULTRASOUND (EUS);  Surgeon: Carol Ada, MD;  Location: [facility];  Service: Endoscopy;  Laterality: N/A;    I have reviewed the social history and family history with the patient and they are unchanged from previous note.  ALLERGIES:  has No Known Allergies.  MEDICATIONS:  Current Outpatient Medications  Medication Sig Dispense Refill  . aspirin 81 MG chewable tablet Chew 1 tablet (81 mg total) by mouth daily.    . chlorproMAZINE  (THORAZINE) 25 MG tablet Take 1 tablet (25 mg total) by mouth 3 (three) times daily as needed for hiccoughs. 20 tablet 0  . clopidogrel (PLAVIX) 75 MG tablet Take 75 mg by mouth at bedtime.     Marland Kitchen dexlansoprazole (DEXILANT) 60 MG capsule Take 60 mg by mouth daily as needed (for reflux.).     Marland Kitchen doxycycline (VIBRAMYCIN) 100 MG capsule Take 100 mg by mouth daily as needed for itching.  1  . HYDROcodone-acetaminophen (NORCO/VICODIN) 5-325 MG tablet Take 1 tablet by mouth every 6 (six) hours as needed for severe pain. 40 tablet 0  . hydrocortisone (ANUSOL-HC) 2.5 % rectal cream Place 1 application rectally 2 (two) times daily as needed for hemorrhoids. 30 g 1  . hydrocortisone 2.5 % ointment Apply 1 application topically 2 (two) times daily as needed (for skin irritation).   0  . lidocaine-prilocaine (EMLA) cream Apply 1 application topically as needed (local anesthesia).    . LORazepam (ATIVAN) 1 MG tablet Take 1 tablet (1 mg total) by mouth at bedtime. 5 tablet 0  . metFORMIN (GLUCOPHAGE) 500 MG tablet Take 500 mg by mouth at bedtime.   2  . mirtazapine (REMERON) 7.5 MG tablet Take 1 tablet (7.5 mg total) by mouth at bedtime. 30 tablet 1  . Multiple Vitamins-Minerals (MULTIVITAMIN WITH MINERALS) tablet Take 1 tablet by mouth daily.    . ondansetron (ZOFRAN) 8 MG tablet Take 1 tablet (8 mg total) by mouth 2 (two) times daily as needed for refractory nausea / vomiting. Start on day 3 after chemotherapy. 30 tablet 1  . oxyCODONE (OXY IR/ROXICODONE) 5 MG immediate release tablet Take 1 tablet (5 mg total) by mouth every 6 (six) hours as needed for severe pain. 40 tablet 0  . prochlorperazine (COMPAZINE) 10 MG tablet Take 1 tablet (10 mg total) by mouth every 6 (six) hours as needed (NAUSEA). 30 tablet 1  . tamsulosin (FLOMAX) 0.4 MG CAPS capsule Take 0.4 mg by mouth every evening.  11  . traMADol (ULTRAM) 50 MG tablet Take 50 mg by mouth every 6 (six) hours as needed (pain).     . cilostazol (PLETAL) 50 MG  tablet Take 1 tablet (50 mg total) by mouth 2 (two) times daily. (Patient not taking: Reported  on 03/05/2018) 60 tablet 6  . dexamethasone (DECADRON) 4 MG tablet Take 1 tab daily with breakfast on day 3-5 after chemo 30 tablet 1  . metoprolol succinate (TOPROL-XL) 25 MG 24 hr tablet Take 25 mg by mouth at bedtime.     Marland Kitchen morphine (MS CONTIN) 15 MG 12 hr tablet Take 1 tablet (15 mg total) by mouth every 12 (twelve) hours. 30 tablet 0  . simvastatin (ZOCOR) 20 MG tablet Take 20 mg by mouth at bedtime.      No current facility-administered medications for this visit.    Facility-Administered Medications Ordered in Other Visits  Medication Dose Route Frequency Provider Last Rate Last Dose  . fluorouracil (ADRUCIL) 4,050 mg in sodium chloride 0.9 % 69 mL chemo infusion  2,400 mg/m2 (Treatment Plan Recorded) Intravenous 1 day or 1 dose Truitt Merle, MD   4,050 mg at 03/05/18 1827  . sodium chloride flush (NS) 0.9 % injection 10 mL  10 mL Intracatheter Once Truitt Merle, MD        PHYSICAL EXAMINATION: ECOG PERFORMANCE STATUS: 1 - Symptomatic but completely ambulatory  Vitals:   03/05/18 1049  BP: 115/85  Pulse: 79  Resp: 17  Temp: 97.6 F (36.4 C)  SpO2: 100%   Filed Weights   03/05/18 1049  Weight: 117 lb 6.4 oz (53.3 kg)   GENERAL:alert, no distress and comfortable SKIN: skin color, texture, turgor are normal, no rashes or significant lesions EYES: normal, Conjunctiva are pink and non-injected, sclera clear OROPHARYNX:no exudate, no erythema and lips, buccal mucosa, and tongue normal  NECK: supple, thyroid normal size, non-tender, without nodularity LYMPH:  no palpable lymphadenopathy in the cervical, axillary or inguinal LUNGS: clear to auscultation and percussion with normal breathing effort HEART: regular rate & rhythm and no murmurs and no lower extremity edema ABDOMEN:abdomen soft, (+) tenderness at epigastric area, normal bowel sounds Musculoskeletal:no cyanosis of digits and no  clubbing  NEURO: alert & oriented x 3 with fluent speech, no focal motor/sensory deficits  LABORATORY DATA:  I have reviewed the data as listed CBC Latest Ref Rng & Units 03/05/2018 02/21/2018 02/19/2018  WBC 4.0 - 10.5 K/uL 8.0 4.7 7.9  Hemoglobin 13.0 - 17.0 g/dL 11.1(L) 12.6(L) 11.5(L)  Hematocrit 39.0 - 52.0 % 33.5(L) 39.9 35.3(L)  Platelets 150 - 400 K/uL 245 155 233     CMP Latest Ref Rng & Units 03/05/2018 02/21/2018 02/19/2018  Glucose 70 - 99 mg/dL 196(H) 225(H) 303(H)  BUN 8 - 23 mg/dL 10 24(H) 15  Creatinine 0.61 - 1.24 mg/dL 0.80 1.04 1.01  Sodium 135 - 145 mmol/L 140 134(L) 136  Potassium 3.5 - 5.1 mmol/L 4.1 5.0 4.2  Chloride 98 - 111 mmol/L 104 97(L) 99  CO2 22 - 32 mmol/L '26 28 24  '$ Calcium 8.9 - 10.3 mg/dL 9.2 9.7 9.6  Total Protein 6.5 - 8.1 g/dL 6.6 7.8 6.9  Total Bilirubin 0.3 - 1.2 mg/dL 0.3 1.5(H) 0.4  Alkaline Phos 38 - 126 U/L 77 69 79  AST 15 - 41 U/L 15 39 12(L)  ALT 0 - 44 U/L 14 33 15    Tumor Marker CA19.9 (0-35 U/ML) 12/30/2017: 1263 10/15/29019: 1337 03/05/18: PENDING     PATHOLOGY  Diagnosis 12/19/17  FINE NEEDLE ASPIRATION,ENDOSCOPIC, PANCREAS BODY (SPECIMEN 1 OF 1 COLLECTED 12/19/17): MALIGNANT CELLS CONSISTENT WITH ADENOCARCINOMA.   PROCEDURE  Upper endoscopic [place] 12/19/17 per Dr. Carol Ada  In the body of the pancreas there was a large 20 x 30 mm, approximately,  hypoechoic mass. It had irregular margin, but it was round/oval in appearance. There was pancreatic tail atrophy, but the PD was dilated up to 7.6 mm. The celiac axis was clearly viewed and there was no evidence of invasion. In the head of the pancreas a 1 cm lymph node was identified and the pancreas was lobular in the head and uncinate process. The left adrenal was clearly identified and it was enlarged, but there was no evidence of any masses.   RADIOGRAPHIC STUDIES: I have personally reviewed the radiological images as listed and agreed with the findings in the  report.  01/06/2018 CT Chest IMPRESSION: 1. Two non-specific small left lower lobe pulmonary nodules. Otherwise, no evidence of metastatic disease within the chest. 2.  Aortic Atherosclerosis (ICD10-I70.0).  Emphysema (ICD10-J43.9).  01/05/2018 MRI Abdomen IMPRESSION: 1. Poorly marginated hypoenhancing 4.2 x 2.5 x 5.2 cm pancreatic body mass with pancreatic tail atrophy and duct dilation, compatible with known pancreatic adenocarcinoma. Pancreatic tumor appears locally advanced with significant vascular involvement as detailed. No biliary ductal dilatation. 2. No abdominal adenopathy. 3. Two similar subcentimeter enhancing left liver lobe masses as detailed, which are too small to accurately characterize on this mildly motion degraded scan. Differential includes small metastases or hemangiomas. Suggest follow-up MRI abdomen without and with IV contrast in 3 months. These lesions are probably too small to characterize by PET-CT. 4.  Aortic Atherosclerosis (ICD10-I70.0).  OUTSIDE CT CT AP WITH CONTRAST IMPRESSION: (outside records) 12/11/2017 Focal area of abnormal low attenuation in the pancreatic body measuring 3.7 x 2.9 cm with cut off of the duct most concerning for primary pancreatic neoplasm.  2 small low-attenuation foci identified within the liver which are nonspecific however could represent metastatic disease; one is 1.0 cm complex lesion within the hepatic dome, the other area is a low-attenuation focus adjacent to the falciform ligament likely representing a perfusion anomaly or possible local fatty infiltration  Extensive atherosclerotic changes in the abdominal aorta, iliac vessels, splenic vessels.  Occlusion of the right and possibly left iliac.  Suspected chronic re-constitution of flow identified within the femoral vessels bilaterally.  Prostate brachytherapy consistent with history of malignancy  No evidence of small bowel obstruction.  Findings suggestive of  constipation     ASSESSMENT & PLAN:  Michael Maddox is a pleasant 67 y.o. male with history of prostate cancer presented with abdominal pain, 30 lbs weight loss, and constipation worsening over 3-4 months   1. Adenocarcinoma of body of pancreas, cT3N0Mx, unresectable, with indeterminate lung nodes and liver lesions, MSI pending, BRCA mutation (-) -We previously reviewed his medical record including imaging and pathology in detail with the patient and family.  -He has what appears to be localized adenocarcinoma in the body of the pancreas, with 2 indeterminate liver lesions and lung nodules.  -I previously reviewed his recent CT chest, and abdominal MRI image by myself and agree with the radiologist interpretation.  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  His case was discussed in our GI tumor board. -We previously discussed the incurable nature of his cancer and the goal of therapy is palliative  -His genetics was negative for BRCA1 and BRCA2 mutation, he is not a candidate for PARP inhibitor  -I have requested MMR test on his biopsy, I am not sure if we have enough tissue for foundation one molecular testing -He  started FOLFIRINOX on 01/21/18 and has moderately tolerated with constipation, fatigue, weight loss and appetite loss.  -I reduced his chemo dose from cycle 3 due to poor tolerance.  4 tolerated better, but did develop significant nausea on day 3 after chemo and was evaluated in the emergency room. -I reviewed Antiemetics use withCompazine at least 3 times a day before meals for 3-5 days after infusion and Ativan at night. He knows to use zofran after day 3 as needed.  I also called in dexamethasone 4 mg daily with breakfast, he will use from the 3-5 after chemo, his nausea and low  appetite -Labs reviewed, Hg at 11.1, CMP and Tumor Marker still pending. Overall adequate to proceed with FOLFIRINOX today, same dose.  -Plan to repeat scan next month  -F/u in 3 weeks    2. Constipation and abdominal pain  -He is currently on low dose Hydrocodone given 02/03/18 about 4 tabs daily, pain not consistently controlled.  -I prescribed him Oxycodone '5mg'$  q6hours today (02/19/18). Will continue the Norco he has at home as needed in addition to oxycodone  -I discussed his constipation can worsen with pain medication, he understands.  -I encouraged him to increase water and prune juice.  -I discussed increasing Senakot-s or Miralax up to 8 times a day as needed to make sure he has more regular bowel movements, will monitor.  -Given he was alternating his hydrocodone and oxycodone every 3 hours, I prescribed him long acting MS Contin once daily. (03/05/18). He can use Hydrocodone or Oxycodone only as needed for breakthrough pain.    3. Low appetite, weight loss, Nausea -He has previously seen by Lorrin Jackson. I encouraged him to continue to follow up with her.  -He is currently on Mirtazapine 7.'5mg'$ , I recommend he increase to '15mg'$  daily to help his appetite. He agreed.  -I reviewed antiemetic use with Zofran and Compazine at least 3 times a day before each meal. He also has Ativan to use at night  -I called in Dexa to help with both nausea and appetite (03/05/18)  4. Depression and Anxiety -Pt denies depression, but he feels down, lost interests in many things  -Due to anxiety on treatment days NP Lacie previously prescribed him Ativan as needed.  -I will set up consult with social work -I previously increased his mirtazapine dose   5. Adenocarcinoma of the prostate stage T1c  -on 11/20/2016 with gleason score 4+3 and PSA 5.73 s/p radioactive seed implant per Dr. Tammi Klippel on 04/11/18, followed by Dr. Karsten Ro.   6. HTN, HL, DM  -ON plavix, aspirin, metformin -followed by PCP  Dr. Jeanie Cooks -We previously discussed the impact of chemotherapy on his blood pressure and glucose level, and adjust his medication as needed. -I discussed this high glucose can be related to his pancreatic cancer and steroid pre-med.  -I discussed needing to control his BG as this can cause other complications. I strongly encouraged him to follow up with PCP for medication adjustment.   -I strongly encouraged him to check his glucose levels at home daily and lower his sugar intake.  -HTN is well controlled.  -He is currently on sliding scale insulin and metformin for DM.     7. TIA in 2010 s/p endarterectomy, CAD s/p CABG in 2015, PVD  -requiring revascularization and stenting in the LEs and on anticoagulation with plavix and aspirin; followed by cardiologist Dr. [name].   8. Genetics  -we previously referred pt to genetics  -His genetic testing was  negative, including BRCA1 and BRCA2  9. Goal of care discussion  -We again discussed the incurable nature of his cancer, and the overall poor prognosis, especially if he does not have good response to chemotherapy or progress on chemo -The patient understands the goal of care is palliative. -We reviewed the goal of care again today, had a long conversation about quality of life versus quality of life, due to the moderate side effects from chemo, I will reduce his chemo dose, to make it more tolerable.  Also discussed if he wishes we can stop chemo.  -He is full code now    PLAN: -I prescribed him Dexamethasone and MS Contin today  -Labs reviewed and adequate to proceed with FOLFIRINOX today, same dose as last cycle .  -Lab, flush, F/u and chemo in 3 weeks (postpone 1 week due to Thanksgiving)  -CT abd/pel w contrast in 4 weeks    No problem-specific Assessment & Plan notes found for this encounter.   Orders Placed This Encounter  Procedures  . CT Abdomen Pelvis W Contrast    Standing Status:   Future    Standing Expiration Date:    03/05/2019    Order Specific Question:   If indicated for the ordered procedure, I authorize the administration of contrast media per Radiology protocol    Answer:   Yes    Order Specific Question:   Preferred imaging location?    Answer:   [facility]    Order Specific Question:   Is Oral Contrast requested for this exam?    Answer:   Yes, Per Radiology protocol    Order Specific Question:   Radiology Contrast Protocol - do NOT remove file path    Answer:   [email or web address]   Pt's sister had many questions today, especially the goal of care, management of constipation and depression, all questions were answered to the best I can. The patient knows to call the clinic with any problems, questions or concerns. No barriers to learning was detected.  I spent 25 minutes counseling the patient face to face. The total time spent in the appointment was 30 minutes and more than 50% was on counseling and review of test results  I, Joslyn Devon, am acting as scribe for Truitt Merle, MD.   I have reviewed the above documentation for accuracy and completeness, and I agree with the above.       Truitt Merle, MD 03/05/2018

## 2018-03-05 NOTE — Progress Notes (Signed)
Per MD Burr Medico and Burman Nieves in pharmacy, rate on 5FU pump was increased to 3.4 per hour and he will return at 130/2pm on 11/16 and we will prime 7ml as directed if it is still there residually. Patient made aware.

## 2018-03-05 NOTE — Patient Instructions (Signed)
[facility] Discharge Instructions for Patients Receiving Chemotherapy  Today you received the following chemotherapy agents :  Oxaliplatin,  Irinotecan, Leucovorin, Fluorouracil.  To help prevent nausea and vomiting after your treatment, we encourage you to take your nausea medication as prescribed.   If you develop nausea and vomiting that is not controlled by your nausea medication, call the clinic.   BELOW ARE SYMPTOMS THAT SHOULD BE REPORTED IMMEDIATELY:  *FEVER GREATER THAN 100.5 F  *CHILLS WITH OR WITHOUT FEVER  NAUSEA AND VOMITING THAT IS NOT CONTROLLED WITH YOUR NAUSEA MEDICATION  *UNUSUAL SHORTNESS OF BREATH  *UNUSUAL BRUISING OR BLEEDING  TENDERNESS IN MOUTH AND THROAT WITH OR WITHOUT PRESENCE OF ULCERS  *URINARY PROBLEMS  *BOWEL PROBLEMS  UNUSUAL RASH Items with * indicate a potential emergency and should be followed up as soon as possible.  Feel free to call the clinic should you have any questions or concerns. The clinic phone number is [phone].  Please show the CHEMO ALERT CARD at check-in to the Emergency Department and triage nurse.   

## 2018-03-06 ENCOUNTER — Encounter: Payer: Self-pay | Admitting: General Practice

## 2018-03-06 LAB — CANCER ANTIGEN 19-9: CA 19-9: 1237 U/mL — ABNORMAL HIGH (ref 0–35)

## 2018-03-06 NOTE — Progress Notes (Signed)
[place] Work  Clinical Social Work was referred by MD for assessment of psychosocial needs.  Clinical Social Worker met with patient on 11/14 in infusion room to offer support and assess for needs. 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  Has significant support from family including daughter and sister, both of whom accompanied patient to today's treatment.  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  He now spends his time mostly at home, finds it difficult to find activities that are pleasurable.  Mostly watches television and stays in his home.  Admits to some frustration with family members hovering or worrying about him and his medication regimen.  Is used to being independent and self sufficient, fully able to care for himself.  Illness has resulted in increased dependence which is frustrating.  Discussed his history of emigration from [place] for college, and his desire to return there at the conclusion of treatment, hopefully summer 2020.  Provided art activity which involves working on expressing dreams/desires/goals for future.  Will continue to touch base w patient while in infusion for support/resources/encouargement.  Edwyna Shell, LCSW Clinical Social Worker Phone:  [phone]

## 2018-03-07 ENCOUNTER — Inpatient Hospital Stay: Payer: Medicare Other

## 2018-03-07 VITALS — BP 134/89 | HR 91 | Temp 98.5°F | Resp 18

## 2018-03-07 DIAGNOSIS — C251 Malignant neoplasm of body of pancreas: Secondary | ICD-10-CM | POA: Diagnosis not present

## 2018-03-07 MED ORDER — SODIUM CHLORIDE 0.9% FLUSH
10.0000 mL | INTRAVENOUS | Status: DC | PRN
  Administered 2018-03-07: 10 mL
  Filled 2018-03-07: qty 10

## 2018-03-07 MED ORDER — HEPARIN SOD (PORK) LOCK FLUSH 100 UNIT/ML IV SOLN
500.0000 [IU] | Freq: Once | INTRAVENOUS | Status: AC | PRN
  Administered 2018-03-07: 500 [IU]
  Filled 2018-03-07: qty 5

## 2018-03-07 MED ORDER — PEGFILGRASTIM-CBQV 6 MG/0.6ML ~~LOC~~ SOSY
6.0000 mg | PREFILLED_SYRINGE | Freq: Once | SUBCUTANEOUS | Status: AC
  Administered 2018-03-07: 6 mg via SUBCUTANEOUS

## 2018-03-07 MED ORDER — PEGFILGRASTIM-CBQV 6 MG/0.6ML ~~LOC~~ SOSY
PREFILLED_SYRINGE | SUBCUTANEOUS | Status: AC
  Filled 2018-03-07: qty 0.6

## 2018-03-07 NOTE — Patient Instructions (Signed)
Pegfilgrastim injection(Udenyca) What is this medicine? PEGFILGRASTIM (PEG fil gra stim) is a long-acting granulocyte colony-stimulating factor that stimulates the growth of neutrophils, a type of white blood cell important in the body's fight against infection. It is used to reduce the incidence of fever and infection in patients with certain types of cancer who are receiving chemotherapy that affects the bone marrow, and to increase survival after being exposed to high doses of radiation. This medicine may be used for other purposes; ask your health care provider or pharmacist if you have questions. COMMON BRAND NAME(S): Neulasta, Udenyca Dehydration, Adult Dehydration is when there is not enough fluid or water in your body. This happens when you lose more fluids than you take in. Dehydration can range from mild to very bad. It should be treated right away to keep it from getting very bad. Symptoms of mild dehydration may include:  Thirst.  Dry lips.  Slightly dry mouth.  Dry, warm skin.  Dizziness. Symptoms of moderate dehydration may include:  Very dry mouth.  Muscle cramps.  Dark pee (urine). Pee may be the color of tea.  Your body making less pee.  Your eyes making fewer tears.  Heartbeat that is uneven or faster than normal (palpitations).  Headache.  Light-headedness, especially when you stand up from sitting.  Fainting (syncope). Symptoms of very bad dehydration may include:  Changes in skin, such as: ? Cold and clammy skin. ? Blotchy (mottled) or pale skin. ? Skin that does not quickly return to normal after being lightly pinched and let go (poor skin turgor).  Changes in body fluids, such as: ? Feeling very thirsty. ? Your eyes making fewer tears. ? Not sweating when body temperature is high, such as in hot weather. ? Your body making very little pee.  Changes in vital signs, such as: ? Weak pulse. ? Pulse that is more than 100 beats a minute when you  are sitting still. ? Fast breathing. ? Low blood pressure.  Other changes, such as: ? Sunken eyes. ? Cold hands and feet. ? Confusion. ? Lack of energy (lethargy). ? Trouble waking up from sleep. ? Short-term weight loss. ? Unconsciousness. Follow these instructions at home:  If told by your doctor, drink an ORS: ? Make an ORS by using instructions on the package. ? Start by drinking small amounts, about  cup (120 mL) every 5-10 minutes. ? Slowly drink more until you have had the amount that your doctor said to have.  Drink enough clear fluid to keep your pee clear or pale yellow. If you were told to drink an ORS, finish the ORS first, then start slowly drinking clear fluids. Drink fluids such as: ? Water. Do not drink only water by itself. Doing that can make the salt (sodium) level in your body get too low (hyponatremia). ? Ice chips. ? Fruit juice that you have added water to (diluted). ? Low-calorie sports drinks.  Avoid: ? Alcohol. ? Drinks that have a lot of sugar. These include high-calorie sports drinks, fruit juice that does not have water added, and soda. ? Caffeine. ? Foods that are greasy or have a lot of fat or sugar.  Take over-the-counter and prescription medicines only as told by your doctor.  Do not take salt tablets. Doing that can make the salt level in your body get too high (hypernatremia).  Eat foods that have minerals (electrolytes). Examples include bananas, oranges, potatoes, tomatoes, and spinach.  Keep all follow-up visits as told by your  doctor. This is important. Contact a doctor if:  You have belly (abdominal) pain that: ? Gets worse. ? Stays in one area (localizes).  You have a rash.  You have a stiff neck.  You get angry or annoyed more easily than normal (irritability).  You are more sleepy than normal.  You have a harder time waking up than normal.  You feel: ? Weak. ? Dizzy. ? Very thirsty.  You have peed (urinated) only a  small amount of very dark pee during 6-8 hours. Get help right away if:  You have symptoms of very bad dehydration.  You cannot drink fluids without throwing up (vomiting).  Your symptoms get worse with treatment.  You have a fever.  You have a very bad headache.  You are throwing up or having watery poop (diarrhea) and it: ? Gets worse. ? Does not go away.  You have blood or something green (bile) in your throw-up.  You have blood in your poop (stool). This may cause poop to look black and tarry.  You have not peed in 6-8 hours.  You pass out (faint).  Your heart rate when you are sitting still is more than 100 beats a minute.  You have trouble breathing. This information is not intended to replace advice given to you by your health care provider. Make sure you discuss any questions you have with your health care provider. Document Released: 02/02/2009 Document Revised: 10/27/2015 Document Reviewed: 06/02/2015 Elsevier Interactive Patient Education  2018 [employer].  What should I tell my health care provider before I take this medicine? They need to know if you have any of these conditions: -kidney disease -latex allergy -ongoing radiation therapy -sickle cell disease -skin reactions to acrylic adhesives (On-Body Injector only) -an unusual or allergic reaction to pegfilgrastim, filgrastim, other medicines, foods, dyes, or preservatives -pregnant or trying to get pregnant -breast-feeding How should I use this medicine? This medicine is for injection under the skin. If you get this medicine at home, you will be taught how to prepare and give the pre-filled syringe or how to use the On-body Injector. Refer to the patient Instructions for Use for detailed instructions. Use exactly as directed. Tell your healthcare provider immediately if you suspect that the On-body Injector may not have performed as intended or if you suspect the use of the On-body Injector resulted in a  missed or partial dose. It is important that you put your used needles and syringes in a special sharps container. Do not put them in a trash can. If you do not have a sharps container, call your pharmacist or healthcare provider to get one. Talk to your pediatrician regarding the use of this medicine in children. While this drug may be prescribed for selected conditions, precautions do apply. Overdosage: If you think you have taken too much of this medicine contact a poison control center or emergency room at once. NOTE: This medicine is only for you. Do not share this medicine with others. What if I miss a dose? It is important not to miss your dose. Call your doctor or health care professional if you miss your dose. If you miss a dose due to an On-body Injector failure or leakage, a new dose should be administered as soon as possible using a single prefilled syringe for manual use. What may interact with this medicine? Interactions have not been studied. Give your health care provider a list of all the medicines, herbs, non-prescription drugs, or dietary supplements you  use. Also tell them if you smoke, drink alcohol, or use illegal drugs. Some items may interact with your medicine. This list may not describe all possible interactions. Give your health care provider a list of all the medicines, herbs, non-prescription drugs, or dietary supplements you use. Also tell them if you smoke, drink alcohol, or use illegal drugs. Some items may interact with your medicine. What should I watch for while using this medicine? You may need blood work done while you are taking this medicine. If you are going to need a MRI, CT scan, or other procedure, tell your doctor that you are using this medicine (On-Body Injector only). What side effects may I notice from receiving this medicine? Side effects that you should report to your doctor or health care professional as soon as possible: -allergic reactions like  skin rash, itching or hives, swelling of the face, lips, or tongue -dizziness -fever -pain, redness, or irritation at site where injected -pinpoint red spots on the skin -red or dark-brown urine -shortness of breath or breathing problems -stomach or side pain, or pain at the shoulder -swelling -tiredness -trouble passing urine or change in the amount of urine Side effects that usually do not require medical attention (report to your doctor or health care professional if they continue or are bothersome): -bone pain -muscle pain This list may not describe all possible side effects. Call your doctor for medical advice about side effects. You may report side effects to FDA at 1-800-FDA-1088. Where should I keep my medicine? Keep out of the reach of children. Store pre-filled syringes in a refrigerator between 2 and 8 degrees C (36 and 46 degrees F). Do not freeze. Keep in carton to protect from light. Throw away this medicine if it is left out of the refrigerator for more than 48 hours. Throw away any unused medicine after the expiration date. NOTE: This sheet is a summary. It may not cover all possible information. If you have questions about this medicine, talk to your doctor, pharmacist, or health care provider.  2018 Elsevier/Gold Standard (2016-04-04 12:58:03)

## 2018-03-10 NOTE — Progress Notes (Signed)
PA for morphine sulfate has been submitted.

## 2018-03-16 ENCOUNTER — Other Ambulatory Visit: Payer: Self-pay | Admitting: Hematology

## 2018-03-16 ENCOUNTER — Telehealth: Payer: Self-pay

## 2018-03-16 MED ORDER — OXYCODONE HCL 5 MG PO TABS: 5.0000 mg | ORAL_TABLET | Freq: Four times a day (QID) | ORAL | 0 refills | Status: DC | PRN

## 2018-03-16 MED ORDER — OXYCODONE HCL ER 10 MG PO T12A: 10.0000 mg | EXTENDED_RELEASE_TABLET | Freq: Two times a day (BID) | ORAL | 0 refills | Status: DC

## 2018-03-16 NOTE — Telephone Encounter (Signed)
I called pt but no answers and not able to leave a message. I called her daughter, pt experienced hallucination and stopped. He is still taking oxycodone or Hydrocordone every 3 hours. I refilled oxycodone, and prescribed oxycontin 10mg  q12h for 2 week, to see if he can tolerate better. His daughter voiced good understanding and will talk to pt.  Truitt Merle MD

## 2018-03-16 NOTE — Telephone Encounter (Signed)
Patient's daughter calls stating he needs refills on Oxycodone and Hydrocodone.  He could not take the MS Contin too strong and had bad side effects.  He uses [facility].

## 2018-03-25 NOTE — Progress Notes (Signed)
[place]   [phone] [phone]) [phone]   Clinic Follow up Note   Patient Care Team: Nolene Ebbs, MD as PCP - General (Internal Medicine) Lorretta Harp, MD as PCP - Cardiology (Cardiology) Carol Ada, MD as Consulting Physician (Gastroenterology) Truitt Merle, MD as Consulting Physician (Hematology) Estevan Oaks, NP as Nurse Practitioner (Hospice and Palliative Medicine)  Date of Service:  03/26/2018  CHIEF COMPLAINT: F/u of Pancreatic Cancer, unresectable   SUMMARY OF ONCOLOGIC HISTORY: Oncology History   Cancer Staging Malignant neoplasm of body of pancreas [facility]) Staging form: Exocrine Pancreas, AJCC 8th Edition - Clinical stage from 12/19/2017: Stage IIA (cT3, cN0, cM0) - Signed by Truitt Merle, MD on 01/13/2018       Malignant neoplasm of body of pancreas ([place])   12/11/2017 Imaging    CT AP WITH CONTRAST IMPRESSION: Focal area of abnormal low attenuation in the pancreatic body measuring 3.7 x 2.9 cm with cut off of the duct most concerning for primary pancreatic neoplasm.  2 small low-attenuation foci identified within the liver which are nonspecific however could represent metastatic disease; one is 1.0 cm complex lesion within the hepatic dome, the other area is a low-attenuation focus adjacent to the falciform ligament likely representing a perfusion anomaly or possible local fatty infiltration  Extensive atherosclerotic changes in the abdominal aorta, iliac vessels, splenic vessels.  Occlusion of the right and possibly left iliac.  Suspected chronic re-constitution of flow identified within the femoral vessels bilaterally.  Prostate brachytherapy consistent with history of malignancy  No evidence of small bowel obstruction.  Findings suggestive of constipation     12/19/2017 Procedure    EUS biopsy: In the body of the pancreas there was a large 20 x 30 mm, approximately, hypoechoic mass. It had irregular margin, but it was round/oval  in appearance. There was pancreatic tail atrophy, but the PD was dilated up to 7.6 mm. The celiac axis was clearly viewed and there was no evidence of invasion. In the head of the pancreas a 1 cm lymph node was identified and the pancreas was lobular in the head and uncinate process. The left adrenal was clearly identified and it was enlarged, but there was no evidence of any masses.    12/19/2017 Initial Biopsy    Diagnosis FINE NEEDLE ASPIRATION,ENDOSCOPIC, PANCREAS BODY (SPECIMEN 1 OF 1 COLLECTED 12/19/17): MALIGNANT CELLS CONSISTENT WITH ADENOCARCINOMA.    12/19/2017 Initial Diagnosis    Malignant neoplasm of body of pancreas ([place])    12/19/2017 Cancer Staging    Staging form: Exocrine Pancreas, AJCC 8th Edition - Clinical stage from 12/19/2017: Stage IIA (cT3, cN0, cM0) - Signed by Truitt Merle, MD on 01/13/2018    01/05/2018 Imaging    01/05/2018 MRI Abdomen IMPRESSION: 1. Poorly marginated hypoenhancing 4.2 x 2.5 x 5.2 cm pancreatic body mass with pancreatic tail atrophy and duct dilation, compatible with known pancreatic adenocarcinoma. Pancreatic tumor appears locally advanced with significant vascular involvement as detailed. No biliary ductal dilatation. 2. No abdominal adenopathy. 3. Two similar subcentimeter enhancing left liver lobe masses as detailed, which are too small to accurately characterize on this mildly motion degraded scan. Differential includes small metastases or hemangiomas. Suggest follow-up MRI abdomen without and with IV contrast in 3 months. These lesions are probably too small to characterize by PET-CT. 4.  Aortic Atherosclerosis (ICD10-I70.0).    01/06/2018 Imaging    01/06/2018 CT Chest IMPRESSION: 1. Two non-specific small left lower lobe pulmonary nodules. Otherwise, no evidence of metastatic disease within  the chest. 2.  Aortic Atherosclerosis (ICD10-I70.0).  Emphysema (ICD10-J43.9).    01/13/2018 Tumor Marker    Tumor Marker Results for HARROL, NOVELLO (MRN [id number]) as of 01/13/2018 09:17  Ref. Range 12/30/2017 14:59  CA 19-9 Latest Ref Range: 0 - 35 U/mL 1,263 (H)      01/21/2018 -  Chemotherapy    FOLFIRINOX every 2 weeks starting 01/21/18. Increased to full dose oxaliplatin on cycle 2    02/03/2018 Genetic Testing    Negative genetic testing on the [facility] Multi-Cancer Panel + Brain Cancer Panel. The Multi-Cancer Panel offered by [facility] includes sequencing and/or deletion duplication testing of the following 84 genes: AIP, ALK, APC, ATM, AXIN2,BAP1,  BARD1, BLM, BMPR1A, BRCA1, BRCA2, BRIP1, CASR, CDC73, CDH1, CDK4, CDKN1B, CDKN1C, CDKN2A (p14ARF), CDKN2A (p16INK4a), CEBPA, CHEK2, CTNNA1, DICER1, DIS3L2, EGFR (c.2369C>T, p.Thr790Met variant only), EPCAM (Deletion/duplication testing only), FH, FLCN, GATA2, GPC3, GREM1 (Promoter region deletion/duplication testing only), HOXB13 (c.251G>A, p.Gly84Glu), HRAS, KIT, MAX, MEN1, MET, MITF (c.952G>A, p.Glu318Lys variant only), MLH1, MSH2, MSH3, MSH6, MUTYH, NBN, NF1, NF2, NTHL1, PALB2, PDGFRA, PHOX2B, PMS2, POLD1, POLE, POT1, PRKAR1A, PTCH1, PTEN, RAD50, RAD51C, RAD51D, RB1, RECQL4, RET, RUNX1, SDHAF2, SDHA (sequence changes only), SDHB, SDHC, SDHD, SMAD4, SMARCA4, SMARCB1, SMARCE1, STK11, SUFU, TERC, TERT, TMEM127, TP53, TSC1, TSC2, VHL, WRN and WT1.  The CNS/Brain Cancer Panel offered by [facility] includes sequencing and/or deletion duplication testing of the following 42 genes: ALK, APC, BAP1, BARD1, CDK4, CDKN2A, DICER1, EPCAM, EZH2, GPC3, HRAS, KIF1B, MEN1, MLH1, MSH2, MSH6, NF1, NF2, PHOX2B, PMS2, POT1, PRKAR1A, PTCH2, PTEN, RB1, SMARCA4, SMARCB1, SMARCE1, SUFU, TP53, TSC1, TSC2, and VHL.   The report date is 02/03/2018.       CURRENT THERAPY:  First line FOLFIRINOX every 2 weeks starting 01/21/18  INTERVAL HISTORY:  [name] is here for a follow up of and ongoing treatment. He presents to the infusion room today with his daughter. He notes he is doing fair at home since starting  treatment. He notes left abdominal pain radiating to the right although his appetite is improving. His pain lasts for hours and can effect his sleep. He started MS Contin but held due to side effects of bad dreams. He noticed he feels better when he takes MS Contin when he takes it in the morning once daily and oxycodone BID in the day. He notes he is not able to do much at home, but can still do ADLs. He notes his fatigue last for 2-3 days after infusion.  His BM is adequate with lactulose. He is still taking boost for supplements. He met with Dietician before.      REVIEW OF SYSTEMS:   Constitutional: Denies fevers, chills or abnormal weight loss (+) fatigue (+) improved appetite (+) trouble sleeping  Eyes: Denies blurriness of vision Ears, nose, mouth, throat, and face: Denies mucositis or sore throat Respiratory: Denies cough, dyspnea or wheezes Cardiovascular: Denies palpitation, chest discomfort or lower extremity swelling Gastrointestinal:  Denies nausea, heartburn or change in bowel habits (+) left abdominal pain radiating to the right  Skin: Denies abnormal skin rashes Lymphatics: Denies new lymphadenopathy or easy bruising Neurological:Denies numbness, tingling or new weaknesses Behavioral/Psych: Mood is stable, no new changes  All other systems were reviewed with the patient and are negative.  MEDICAL HISTORY:  Past Medical History:  Diagnosis Date  . Arthritis   . Carotid artery disease ([place]) followed by dr berry--- per last duplex [id number]  LICA 24-23%, bilateral ECA >50%,  patent RICA post intervention  hx acute high grade stenosis RICA >80% 02-22-2009 in setting of  stroke  s/p  right carotid endarterectomy 02-28-2009/    . Claudication of both lower extremities ([place])    due to PAD ----  right > left  . COPD (chronic obstructive pulmonary disease) ([place])   . Coronary artery disease CARDIOLOGIST-  DR BERRY   hx positive myoview 07-29-2013;  08-16-2013 per cardiac cath,  severe 3V CAD w/ 60% subclavian stenosis;  08-23-2013   s/p  CABG x3  (LIMA to LAD, SVG to OM1, SVG to PDA)  . CVA (cerebral vascular accident) ([place]) 02/22/2009   acute infarct right hemisphere w/ severe RICA stentosis----  per pt no residuals  . Family history of brain cancer   . Family history of breast cancer   . GERD (gastroesophageal reflux disease)   . HTN (hypertension)   . Hyperlipidemia   . Hyperplasia of prostate with lower urinary tract symptoms (LUTS)   . Peripheral arterial disease ([place]) followed by dr berry--- last dobbler ABIs bilateral SFA occlusions (pt is scheduled for intervention 05-08-2017   hx PTA and stenting to bilateral SFA 2002 and PTA w/ stenting left common iliac artery and for in-stent restenosis 2003 Left SFA and 2005  stenting right SFA for in-stent restenosis  . Prostate cancer [facility]) urologist-  dr ottelin/  oncologist-  dr Tammi Klippel   dx 11-20-2016-- Stage T1c, Gleason 4+3, PSA 5.73, vol 35.95cc--- scheduled for radioactive seed implants 04-11-2017  . 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 S/P peripheral artery angioplasty with stent placement    bilateral SFA 2002;  in-stenosis bilateral SFA  (right 2003, left 2005) and left CIA 2003  . Shortness of breath   . Type II diabetes mellitus ([place])   . Weak urinary stream   . Wears glasses     SURGICAL HISTORY: Past Surgical History:  Procedure Laterality Date  . ABDOMINAL AORTOGRAM N/A 05/08/2017   Procedure: ABDOMINAL AORTOGRAM;  Surgeon: Lorretta Harp, MD;  Location: [place] [facility];  Service: Cardiovascular;  Laterality: N/A;  . CARDIAC CATHETERIZATION  12-01-2000    dr berry    non-critial CAD & nonischemic cardiolite-- mLAD 50%, mLCx 70%, prox. to mid RCA 75% focal midsegment 80-85%  . CARDIOVASCULAR STRESS TEST  07-29-2013    dr berry   High risk nuclear study w/ inferoseptal ischemia/  normal LV function and wall motion,  nuclear study ef 52%  . CAROTID ENDARTERECTOMY  Right 02-28-2009    dr c. Scot Dock  [facility]   patch angioplasty  . COLONOSCOPY    . CORONARY ARTERY BYPASS GRAFT N/A 08/23/2013   Procedure: CORONARY ARTERY BYPASS GRAFTING (CABG);  Surgeon: Melrose Nakayama, MD;  Location: [place];  Service: Open Heart Surgery;  Laterality: N/A;  Coronary Artery Bypass graft times three using left internal mammary artery and left leg saphenous vein   . CYSTOSCOPY N/A 04/11/2017   Procedure: CYSTOSCOPY FLEXIBLE;  Surgeon: Kathie Rhodes, MD;  Location: [facility];  Service: Urology;  Laterality: N/A;  no seeds found in bladder  . ENDOVASCULAR STENT INSERTION Bilateral right 12-25-2000 ;  left 03-02-2001   dr berry   PTA and stenting to bilateral SFA  . ENDOVASCULAR STENT INSERTION Bilateral right 08-30-2003 ;  left 11-10-2001    dr berry   PTA and stenting for in-stent restenosis right SFA 08-30-2003:  PTA and stenting distal left common iliac artery and in-stent restenosis  left SFA 11-10-2001  . FINE NEEDLE ASPIRATION N/A 12/19/2017   Procedure: FINE NEEDLE ASPIRATION (FNA) LINEAR;  Surgeon: Carol Ada, MD;  Location: [facility];  Service: Endoscopy;  Laterality: N/A;  . INTRAOPERATIVE TRANSESOPHAGEAL ECHOCARDIOGRAM N/A 08/23/2013   Procedure: INTRAOPERATIVE TRANSESOPHAGEAL ECHOCARDIOGRAM;  Surgeon: Melrose Nakayama, MD;  Location: [place];  Service: Open Heart Surgery;  Laterality: N/A;  . IR IMAGING GUIDED PORT INSERTION  01/20/2018  . LEFT HEART CATHETERIZATION WITH CORONARY ANGIOGRAM N/A 08/16/2013   Procedure: LEFT HEART CATHETERIZATION WITH CORONARY ANGIOGRAM;  Surgeon: Lorretta Harp, MD;  Location: [facility] C[facility] LAB;  Service: Cardiovascular;  Laterality: N/A;   severe 3V CAD and 60% subclavian sternosis  . LOWER EXTREMITY ANGIOGRAM N/A 08/16/2013   Procedure: LOWER EXTREMITY ANGIOGRAM;  Surgeon: Lorretta Harp, MD;  Location: [facility];  Service: Cardiovascular;  Laterality: N/A;  . LOWER EXTREMITY ANGIOGRAPHY Bilateral 05/08/2017    Procedure: Lower Extremity Angiography;  Surgeon: Lorretta Harp, MD;  Location: [place] [facility];  Service: Cardiovascular;  Laterality: Bilateral;  . PERIPHERAL VASCULAR BALLOON ANGIOPLASTY Left 05/08/2017   Procedure: PERIPHERAL VASCULAR BALLOON ANGIOPLASTY;  Surgeon: Lorretta Harp, MD;  Location: [place] [facility];  Service: Cardiovascular;  Laterality: Left;  COMMON ILIAC  . RADIOACTIVE SEED IMPLANT N/A 04/11/2017   Procedure: RADIOACTIVE SEED IMPLANT/BRACHYTHERAPY IMPLANT;  Surgeon: Kathie Rhodes, MD;  Location: [facility];  Service: Urology;  Laterality: N/A;    67   seeds implanted  . SPACE OAR INSTILLATION N/A 04/11/2017   Procedure: SPACE OAR INSTILLATION;  Surgeon: Kathie Rhodes, MD;  Location: [facility];  Service: Urology;  Laterality: N/A;  . TRANSTHORACIC ECHOCARDIOGRAM  07/15/2013   ef 50-53%, grade 1 diastolic dysfunction/  trivial MR, mild to moderate PR, mild TR   . UPPER ESOPHAGEAL ENDOSCOPIC ULTRASOUND (EUS) N/A 12/19/2017   Procedure: UPPER ESOPHAGEAL ENDOSCOPIC ULTRASOUND (EUS);  Surgeon: Carol Ada, MD;  Location: [facility];  Service: Endoscopy;  Laterality: N/A;    I have reviewed the social history and family history with the patient and they are unchanged from previous note.  ALLERGIES:  has No Known Allergies.  MEDICATIONS:  Current Outpatient Medications  Medication Sig Dispense Refill  . aspirin 81 MG chewable tablet Chew 1 tablet (81 mg total) by mouth daily.    . chlorproMAZINE (THORAZINE) 25 MG tablet Take 1 tablet (25 mg total) by mouth 3 (three) times daily as needed for hiccoughs. 20 tablet 0  . cilostazol (PLETAL) 50 MG tablet Take 1 tablet (50 mg total) by mouth 2 (two) times daily. (Patient not taking: Reported on 03/05/2018) 60 tablet 6  . clopidogrel (PLAVIX) 75 MG tablet Take 75 mg by mouth at bedtime.     Marland Kitchen dexamethasone (DECADRON) 4 MG tablet Take 1 tab daily with breakfast on day 3-5 after chemo 30  tablet 1  . dexlansoprazole (DEXILANT) 60 MG capsule Take 60 mg by mouth daily as needed (for reflux.).     Marland Kitchen doxycycline (VIBRAMYCIN) 100 MG capsule Take 100 mg by mouth daily as needed for itching.  1  . HYDROcodone-acetaminophen (NORCO/VICODIN) 5-325 MG tablet Take 1 tablet by mouth every 6 (six) hours as needed for severe pain. 40 tablet 0  . hydrocortisone (ANUSOL-HC) 2.5 % rectal cream Place 1 application rectally 2 (two) times daily as needed for hemorrhoids. 30 g 1  . hydrocortisone 2.5 % ointment Apply 1 application topically 2 (two) times daily as needed (for skin irritation).   0  .  lidocaine-prilocaine (EMLA) cream Apply 1 application topically as needed (local anesthesia).    . LORazepam (ATIVAN) 1 MG tablet Take 1 tablet (1 mg total) by mouth at bedtime. 5 tablet 0  . metFORMIN (GLUCOPHAGE) 500 MG tablet Take 500 mg by mouth at bedtime.   2  . metoprolol succinate (TOPROL-XL) 25 MG 24 hr tablet Take 25 mg by mouth at bedtime.     . mirtazapine (REMERON) 7.5 MG tablet Take 1 tablet (7.5 mg total) by mouth at bedtime. 30 tablet 1  . morphine (MS CONTIN) 15 MG 12 hr tablet Take 1 tablet (15 mg total) by mouth every 12 (twelve) hours. 30 tablet 0  . Multiple Vitamins-Minerals (MULTIVITAMIN WITH MINERALS) tablet Take 1 tablet by mouth daily.    . ondansetron (ZOFRAN) 8 MG tablet Take 1 tablet (8 mg total) by mouth 2 (two) times daily as needed for refractory nausea / vomiting. Start on day 3 after chemotherapy. 30 tablet 1  . oxyCODONE (OXY IR/ROXICODONE) 5 MG immediate release tablet Take 1 tablet (5 mg total) by mouth every 6 (six) hours as needed for severe pain. 90 tablet 0  . oxyCODONE (OXYCONTIN) 10 mg 12 hr tablet Take 1 tablet (10 mg total) by mouth every 12 (twelve) hours. 30 tablet 0  . prochlorperazine (COMPAZINE) 10 MG tablet Take 1 tablet (10 mg total) by mouth every 6 (six) hours as needed (NAUSEA). 30 tablet 1  . simvastatin (ZOCOR) 20 MG tablet Take 20 mg by mouth at  bedtime.     . tamsulosin (FLOMAX) 0.4 MG CAPS capsule Take 0.4 mg by mouth every evening.  11  . traMADol (ULTRAM) 50 MG tablet Take 50 mg by mouth every 6 (six) hours as needed (pain).      No current facility-administered medications for this visit.    Facility-Administered Medications Ordered in Other Visits  Medication Dose Route Frequency Provider Last Rate Last Dose  . atropine injection 0.5 mg  0.5 mg Intravenous Once PRN Truitt Merle, MD      . dextrose 5 % solution   Intravenous Once Truitt Merle, MD      . fluorouracil (ADRUCIL) 4,050 mg in sodium chloride 0.9 % 69 mL chemo infusion  2,400 mg/m2 (Treatment Plan Recorded) Intravenous 1 day or 1 dose Truitt Merle, MD      . irinotecan (CAMPTOSAR) 220 mg in sodium chloride 0.9 % 500 mL chemo infusion  130 mg/m2 (Treatment Plan Recorded) Intravenous Once Truitt Merle, MD      . leucovorin 676 mg in sodium chloride 0.9 % 250 mL infusion  400 mg/m2 (Treatment Plan Recorded) Intravenous Once Truitt Merle, MD      . oxaliplatin (ELOXATIN) 120 mg in dextrose 5 % 500 mL chemo infusion  70 mg/m2 (Treatment Plan Recorded) Intravenous Once Truitt Merle, MD      . sodium chloride flush (NS) 0.9 % injection 10 mL  10 mL Intracatheter Once Truitt Merle, MD        PHYSICAL EXAMINATION: ECOG PERFORMANCE STATUS: 1 - Symptomatic but completely ambulatory Weight 115 pounds, blood pressure 126/73, heart rate 92, respiratory to 16, temperature 36.7, pulse ox 100% on room air GENERAL:alert, no distress and comfortable SKIN: skin color, texture, turgor are normal, no rashes or significant lesions EYES: normal, Conjunctiva are pink and non-injected, sclera clear OROPHARYNX:no exudate, no erythema and lips, buccal mucosa, and tongue normal  NECK: supple, thyroid normal size, non-tender, without nodularity LYMPH:  no palpable lymphadenopathy in the cervical, axillary or  inguinal LUNGS: clear to auscultation and percussion with normal breathing effort HEART: regular rate &  rhythm and no murmurs and no lower extremity edema ABDOMEN:abdomen soft, non-tender and normal bowel sounds Musculoskeletal:no cyanosis of digits and no clubbing  NEURO: alert & oriented x 3 with fluent speech, no focal motor/sensory deficits  LABORATORY DATA:  I have reviewed the data as listed CBC Latest Ref Rng & Units 03/26/2018 03/05/2018 02/21/2018  WBC 4.0 - 10.5 K/uL 6.5 8.0 4.7  Hemoglobin 13.0 - 17.0 g/dL 11.9(L) 11.1(L) 12.6(L)  Hematocrit 39.0 - 52.0 % 36.9(L) 33.5(L) 39.9  Platelets 150 - 400 K/uL 278 245 155     CMP Latest Ref Rng & Units 03/26/2018 03/05/2018 02/21/2018  Glucose 70 - 99 mg/dL 286(H) 196(H) 225(H)  BUN 8 - 23 mg/dL 18 10 24(H)  Creatinine 0.61 - 1.24 mg/dL 1.10 0.80 1.04  Sodium 135 - 145 mmol/L 136 140 134(L)  Potassium 3.5 - 5.1 mmol/L 4.1 4.1 5.0  Chloride 98 - 111 mmol/L 101 104 97(L)  CO2 22 - 32 mmol/L '23 26 28  '$ Calcium 8.9 - 10.3 mg/dL 9.7 9.2 9.7  Total Protein 6.5 - 8.1 g/dL 7.1 6.6 7.8  Total Bilirubin 0.3 - 1.2 mg/dL 0.3 0.3 1.5(H)  Alkaline Phos 38 - 126 U/L 80 77 69  AST 15 - 41 U/L 15 15 39  ALT 0 - 44 U/L 24 14 33      RADIOGRAPHIC STUDIES: I have personally reviewed the radiological images as listed and agreed with the findings in the report. No results found.   ASSESSMENT & PLAN:  Michael Maddox is a 67 y.o. male with   1. Adenocarcinoma of body of pancreas, cT3N0Mx, unresectable, with indeterminate lung nodes and liver lesions, MSI pending, BRCA mutation (-) -He was diagnosed in 11/2017. Given his tumor is unresectable due to vascular invasion, and indeterminate lung and liver lesions, his cancer is not curable, but still treatable. Goal of care is to control his disease and prolong his life.  -He is currently on First-line FOLFIRINOX every 2 weeks on 01/21/18.  He tolerated first few cycle poorly, and the dose was reduced. -He is tolerating treatment moderately well with dose reduction.  Appetite is improving but his abdominal  pain is hindering him. We discussed pain medication and the option of Celiac Block. He will think about it.  -He is scheduled for restaging CT scan on 04/06/18.  -Labs reviewed and adequate to proceed with FOLFIRINOX today  -F/u in 2 weeks   2. Constipation and abdominal pain  -He has Senakot-S and Miraliax for constipation. His BM have improved with lactulose, will continue.   -previously on alternating dose of hydrocodone and oxycodone. Pain was not controlled.  -He experienced bad dreams with MS Contin so he takes it intermittently once daily in the morning and uses oxycodone BID. Pain still not completely controlled.  -I instructed him to continue MS Contin once daily for a week and then add once at night if tolerable. He can continue oxycodone for break through pain as needed.  -If medication is not controlling his pain, he has the option of Celiac nerve block to reduce or control his pain. He will think about it. If he chooses to proceed, I will hold treatment 2 weeks before.   3. Low appetite, weight loss, Nausea -He will continue to follow up with Dietician.  -Appetite improved with Mirtazapine '15mg'$  daily. He is still slowly loosing weight  -I advised him to take  small but frequent meals and use Glucerna instead of boost as he is diabetic. He agreed.  -He has Zofran, Compazine and dexa for nausea. He also has Ativan to use at night. Stable nausea.    4. Depression and Anxiety -Pt denies depression, but he feels down, lost interests in many things, he has been able to work lately  -He has Ativan for anxiety, mostly on treatment days   -His daughter would like him to go to a GI support group. He will meet with [place]a today    5. Adenocarcinoma of the prostate stage T1c, s/p radioactive seed implant per Dr. Tammi Klippel on 04/11/17 -Latest gleason score 4+3 and PSA 5.73 on 11/20/2016 -He is being followed by Dr. Karsten Ro.  6.HTN, HL,DM -On Zocor, metoprolol and  metformin -followed by PCP Dr. Jeanie Cooks -HTN is well controlled.  -He is currently on sliding scale insulin and metformin for DM.    7. TIA in 2010 s/p endarterectomy, CAD s/p CABG in 2015, PVD -requiring revascularization and stenting in the LEs.  -He is currently on anticoagulation with plavixand aspirin  -He is being followed by cardiologist Dr. [name].   8. Genetics  -His genetic testing was negative, including BRCA1 and BRCA2  9. Goal of care discussion  -He is full code now    PLAN: -Labs reviewed and adequate to proceed with FOLFIRINOX today  -CT Scan before next visit -Lab, flush, f/u and FOLFIRINOX in 2 weeks  -I requested his biopsy tissue to be tested for MMR    No problem-specific Assessment & Plan notes found for this encounter.   No orders of the defined types were placed in this encounter.  All questions were answered. The patient knows to call the clinic with any problems, questions or concerns. No barriers to learning was detected. I spent 20 minutes counseling the patient face to face. The total time spent in the appointment was 25 minutes and more than 50% was on counseling and review of test results     Truitt Merle, MD 03/26/2018   I, Joslyn Devon, am acting as scribe for Truitt Merle, MD.   I have reviewed the above documentation for accuracy and completeness, and I agree with the above.

## 2018-03-26 ENCOUNTER — Inpatient Hospital Stay: Payer: Medicare Other | Admitting: Hematology

## 2018-03-26 ENCOUNTER — Inpatient Hospital Stay: Payer: Medicare Other

## 2018-03-26 ENCOUNTER — Inpatient Hospital Stay: Payer: Medicare Other | Attending: Hematology

## 2018-03-26 ENCOUNTER — Encounter: Payer: Self-pay | Admitting: Hematology

## 2018-03-26 ENCOUNTER — Ambulatory Visit: Payer: Medicare Other | Admitting: Nutrition

## 2018-03-26 ENCOUNTER — Telehealth: Payer: Self-pay | Admitting: Hematology

## 2018-03-26 VITALS — BP 126/73 | HR 92 | Temp 98.0°F | Resp 16 | Wt 115.5 lb

## 2018-03-26 DIAGNOSIS — Z5189 Encounter for other specified aftercare: Secondary | ICD-10-CM | POA: Diagnosis not present

## 2018-03-26 DIAGNOSIS — C251 Malignant neoplasm of body of pancreas: Secondary | ICD-10-CM

## 2018-03-26 DIAGNOSIS — I10 Essential (primary) hypertension: Secondary | ICD-10-CM | POA: Diagnosis not present

## 2018-03-26 DIAGNOSIS — Z8673 Personal history of transient ischemic attack (TIA), and cerebral infarction without residual deficits: Secondary | ICD-10-CM | POA: Diagnosis not present

## 2018-03-26 DIAGNOSIS — C61 Malignant neoplasm of prostate: Secondary | ICD-10-CM | POA: Insufficient documentation

## 2018-03-26 DIAGNOSIS — K5900 Constipation, unspecified: Secondary | ICD-10-CM

## 2018-03-26 DIAGNOSIS — E119 Type 2 diabetes mellitus without complications: Secondary | ICD-10-CM | POA: Diagnosis not present

## 2018-03-26 DIAGNOSIS — I2510 Atherosclerotic heart disease of native coronary artery without angina pectoris: Secondary | ICD-10-CM | POA: Insufficient documentation

## 2018-03-26 DIAGNOSIS — C787 Secondary malignant neoplasm of liver and intrahepatic bile duct: Secondary | ICD-10-CM | POA: Diagnosis not present

## 2018-03-26 DIAGNOSIS — C7800 Secondary malignant neoplasm of unspecified lung: Secondary | ICD-10-CM

## 2018-03-26 DIAGNOSIS — Z5111 Encounter for antineoplastic chemotherapy: Secondary | ICD-10-CM | POA: Insufficient documentation

## 2018-03-26 DIAGNOSIS — Z79899 Other long term (current) drug therapy: Secondary | ICD-10-CM | POA: Insufficient documentation

## 2018-03-26 DIAGNOSIS — Z951 Presence of aortocoronary bypass graft: Secondary | ICD-10-CM

## 2018-03-26 DIAGNOSIS — J449 Chronic obstructive pulmonary disease, unspecified: Secondary | ICD-10-CM | POA: Insufficient documentation

## 2018-03-26 DIAGNOSIS — F419 Anxiety disorder, unspecified: Secondary | ICD-10-CM | POA: Insufficient documentation

## 2018-03-26 DIAGNOSIS — F329 Major depressive disorder, single episode, unspecified: Secondary | ICD-10-CM | POA: Insufficient documentation

## 2018-03-26 DIAGNOSIS — Z95828 Presence of other vascular implants and grafts: Secondary | ICD-10-CM

## 2018-03-26 LAB — CBC WITH DIFFERENTIAL (CANCER CENTER ONLY)
Abs Immature Granulocytes: 0.03 10*3/uL (ref 0.00–0.07)
BASOS ABS: 0 10*3/uL (ref 0.0–0.1)
Basophils Relative: 1 %
Eosinophils Absolute: 0.1 10*3/uL (ref 0.0–0.5)
Eosinophils Relative: 1 %
HCT: 36.9 % — ABNORMAL LOW (ref 39.0–52.0)
Hemoglobin: 11.9 g/dL — ABNORMAL LOW (ref 13.0–17.0)
Immature Granulocytes: 1 %
Lymphocytes Relative: 15 %
Lymphs Abs: 1 10*3/uL (ref 0.7–4.0)
MCH: 31.8 pg (ref 26.0–34.0)
MCHC: 32.2 g/dL (ref 30.0–36.0)
MCV: 98.7 fL (ref 80.0–100.0)
Monocytes Absolute: 0.6 10*3/uL (ref 0.1–1.0)
Monocytes Relative: 8 %
Neutro Abs: 4.9 10*3/uL (ref 1.7–7.7)
Neutrophils Relative %: 74 %
Platelet Count: 278 10*3/uL (ref 150–400)
RBC: 3.74 MIL/uL — ABNORMAL LOW (ref 4.22–5.81)
RDW: 14.2 % (ref 11.5–15.5)
WBC Count: 6.5 10*3/uL (ref 4.0–10.5)
nRBC: 0 % (ref 0.0–0.2)

## 2018-03-26 LAB — CMP (CANCER CENTER ONLY)
ALT: 24 U/L (ref 0–44)
AST: 15 U/L (ref 15–41)
Albumin: 4 g/dL (ref 3.5–5.0)
Alkaline Phosphatase: 80 U/L (ref 38–126)
Anion gap: 12 (ref 5–15)
BUN: 18 mg/dL (ref 8–23)
CO2: 23 mmol/L (ref 22–32)
Calcium: 9.7 mg/dL (ref 8.9–10.3)
Chloride: 101 mmol/L (ref 98–111)
Creatinine: 1.1 mg/dL (ref 0.61–1.24)
GFR, Est AFR Am: >60 mL/min (ref >60)
GFR, Estimated: >60 mL/min (ref >60)
Glucose, Bld: 286 mg/dL — ABNORMAL HIGH (ref 70–99)
Potassium: 4.1 mmol/L (ref 3.5–5.1)
Sodium: 136 mmol/L (ref 135–145)
Total Bilirubin: 0.3 mg/dL (ref 0.3–1.2)
Total Protein: 7.1 g/dL (ref 6.5–8.1)

## 2018-03-26 MED ORDER — DEXAMETHASONE SODIUM PHOSPHATE 10 MG/ML IJ SOLN
10.0000 mg | Freq: Once | INTRAMUSCULAR | Status: AC
  Administered 2018-03-26: 10 mg via INTRAVENOUS

## 2018-03-26 MED ORDER — SODIUM CHLORIDE 0.9 % IV SOLN
130.0000 mg/m2 | Freq: Once | INTRAVENOUS | Status: AC
  Administered 2018-03-26: 220 mg via INTRAVENOUS
  Filled 2018-03-26: qty 11

## 2018-03-26 MED ORDER — IRINOTECAN HCL CHEMO INJECTION 100 MG/5ML: 130.0000 mg/m2 | Freq: Once | INTRAVENOUS | Status: DC

## 2018-03-26 MED ORDER — SODIUM CHLORIDE 0.9 % IV SOLN
400.0000 mg/m2 | Freq: Once | INTRAVENOUS | Status: AC
  Administered 2018-03-26: 676 mg via INTRAVENOUS
  Filled 2018-03-26: qty 33.8

## 2018-03-26 MED ORDER — LEUCOVORIN CALCIUM INJECTION 350 MG: 400.0000 mg/m2 | Freq: Once | INTRAVENOUS | Status: DC

## 2018-03-26 MED ORDER — DEXTROSE 5 % IV SOLN
Freq: Once | INTRAVENOUS | Status: DC
  Filled 2018-03-26: qty 250

## 2018-03-26 MED ORDER — PALONOSETRON HCL INJECTION 0.25 MG/5ML
0.2500 mg | Freq: Once | INTRAVENOUS | Status: AC
  Administered 2018-03-26: 0.25 mg via INTRAVENOUS

## 2018-03-26 MED ORDER — DEXAMETHASONE SODIUM PHOSPHATE 10 MG/ML IJ SOLN
INTRAMUSCULAR | Status: AC
  Filled 2018-03-26: qty 1

## 2018-03-26 MED ORDER — ATROPINE SULFATE 1 MG/ML IJ SOLN
0.5000 mg | Freq: Once | INTRAMUSCULAR | Status: AC | PRN
  Administered 2018-03-26: 0.5 mg via INTRAVENOUS

## 2018-03-26 MED ORDER — DEXTROSE 5 % IV SOLN
Freq: Once | INTRAVENOUS | Status: AC
  Administered 2018-03-26: 10:00:00 via INTRAVENOUS
  Filled 2018-03-26: qty 250

## 2018-03-26 MED ORDER — SODIUM CHLORIDE 0.9% FLUSH
10.0000 mL | INTRAVENOUS | Status: DC | PRN
  Administered 2018-03-26: 10 mL
  Filled 2018-03-26: qty 10

## 2018-03-26 MED ORDER — OXALIPLATIN CHEMO INJECTION 100 MG/20ML
70.0000 mg/m2 | Freq: Once | INTRAVENOUS | Status: AC
  Administered 2018-03-26: 120 mg via INTRAVENOUS
  Filled 2018-03-26: qty 20

## 2018-03-26 MED ORDER — PALONOSETRON HCL INJECTION 0.25 MG/5ML
INTRAVENOUS | Status: AC
  Filled 2018-03-26: qty 5

## 2018-03-26 MED ORDER — ATROPINE SULFATE 1 MG/ML IJ SOLN
INTRAMUSCULAR | Status: AC
  Filled 2018-03-26: qty 1

## 2018-03-26 MED ORDER — SODIUM CHLORIDE 0.9 % IV SOLN
2400.0000 mg/m2 | INTRAVENOUS | Status: DC
  Administered 2018-03-26: 4050 mg via INTRAVENOUS
  Filled 2018-03-26: qty 81

## 2018-03-26 NOTE — Progress Notes (Signed)
  Oncology Nurse Navigator Documentation  Navigator Location: [facility] (03/26/18 4847)   )Navigator Encounter Type: Treatment (03/26/18 2072)    Met with patient and son to educate on Celiac Plexus Block for pain management. Provided patient CT contrast for CT AP on 04/06/18 and reviewed NPO 4 hours prior and oral contrast times. Patient verbalized understanding. No further questions voiced.                     Barriers/Navigation Needs: Education (03/26/18 0953) Education: Other(Celiac Plexus Block, CT AP contrast directions) (03/26/18 0953) Interventions: Education;Psycho-social support (03/26/18 0953)     Education Method: Teach-back;Verbal;Written (03/26/18 1828)      Acuity: Level 2 (03/26/18 0953)         Time Spent with Patient: 30 (03/26/18 8337)

## 2018-03-26 NOTE — Patient Instructions (Addendum)
Return to the [facility] on 03/28/2018 @ 1230 for pump removal.  [facility] Discharge Instructions for Patients Receiving Chemotherapy  Today you received the following chemotherapy agents:  Oxaliplatin, Irinotecan, Leucovorin, and 5FU.  To help prevent nausea and vomiting after your treatment, we encourage you to take your nausea medication as directed.   If you develop nausea and vomiting that is not controlled by your nausea medication, call the clinic.   BELOW ARE SYMPTOMS THAT SHOULD BE REPORTED IMMEDIATELY:  *FEVER GREATER THAN 100.5 F  *CHILLS WITH OR WITHOUT FEVER  NAUSEA AND VOMITING THAT IS NOT CONTROLLED WITH YOUR NAUSEA MEDICATION  *UNUSUAL SHORTNESS OF BREATH  *UNUSUAL BRUISING OR BLEEDING  TENDERNESS IN MOUTH AND THROAT WITH OR WITHOUT PRESENCE OF ULCERS  *URINARY PROBLEMS  *BOWEL PROBLEMS  UNUSUAL RASH Items with * indicate a potential emergency and should be followed up as soon as possible.  Feel free to call the clinic should you have any questions or concerns. The clinic phone number is [phone].  Please show the [place] at check-in to the Emergency Department and triage nurse.

## 2018-03-26 NOTE — Progress Notes (Signed)
Nutrition follow-up completed with patient during infusion for pancreas cancer. Weight decreased and documented as 115.5 pounds on December 5 down from 122.4 pounds October 31. Patient reports appetite is somewhat improved and he is trying to eat more. He was told to choose Glucerna secondary to increased blood sugars. Noted glucose 286.  Nutrition diagnosis: Malnutrition continues.  Intervention: Educated patient to try to increase calories and protein throughout the day. Recommended patient increase consumption of Glucerna.  Provided samples and coupons. Teach back method used.  Monitoring, evaluation, goals: Patient will tolerate increased calories and protein to minimize further weight loss.  Next visit: To be scheduled as needed.  **Disclaimer: This note was dictated with voice recognition software. Similar sounding words can inadvertently be transcribed and this note may contain transcription errors which may not have been corrected upon publication of note.**

## 2018-03-26 NOTE — Telephone Encounter (Signed)
No los per 12/05

## 2018-03-28 ENCOUNTER — Inpatient Hospital Stay: Payer: Medicare Other

## 2018-03-28 VITALS — BP 124/73 | HR 95 | Temp 98.7°F | Resp 17

## 2018-03-28 DIAGNOSIS — C251 Malignant neoplasm of body of pancreas: Secondary | ICD-10-CM

## 2018-03-28 DIAGNOSIS — Z5111 Encounter for antineoplastic chemotherapy: Secondary | ICD-10-CM | POA: Diagnosis not present

## 2018-03-28 MED ORDER — HEPARIN SOD (PORK) LOCK FLUSH 100 UNIT/ML IV SOLN
500.0000 [IU] | Freq: Once | INTRAVENOUS | Status: AC | PRN
  Administered 2018-03-28: 500 [IU]
  Filled 2018-03-28: qty 5

## 2018-03-28 MED ORDER — SODIUM CHLORIDE 0.9% FLUSH
10.0000 mL | INTRAVENOUS | Status: DC | PRN
  Administered 2018-03-28: 10 mL
  Filled 2018-03-28: qty 10

## 2018-03-28 MED ORDER — PEGFILGRASTIM-CBQV 6 MG/0.6ML ~~LOC~~ SOSY
PREFILLED_SYRINGE | SUBCUTANEOUS | Status: AC
  Filled 2018-03-28: qty 0.6

## 2018-03-28 MED ORDER — PEGFILGRASTIM-CBQV 6 MG/0.6ML ~~LOC~~ SOSY
6.0000 mg | PREFILLED_SYRINGE | Freq: Once | SUBCUTANEOUS | Status: AC
  Administered 2018-03-28: 6 mg via SUBCUTANEOUS

## 2018-03-28 NOTE — Patient Instructions (Signed)
Pegfilgrastim injection What is this medicine? PEGFILGRASTIM (PEG fil gra stim) is a long-acting granulocyte colony-stimulating factor that stimulates the growth of neutrophils, a type of white blood cell important in the body's fight against infection. It is used to reduce the incidence of fever and infection in patients with certain types of cancer who are receiving chemotherapy that affects the bone marrow, and to increase survival after being exposed to high doses of radiation. This medicine may be used for other purposes; ask your health care provider or pharmacist if you have questions. COMMON BRAND NAME(S): Neulasta, Udencya What should I tell my health care provider before I take this medicine? They need to know if you have any of these conditions: -kidney disease -latex allergy -ongoing radiation therapy -sickle cell disease -skin reactions to acrylic adhesives (On-Body Injector only) -an unusual or allergic reaction to pegfilgrastim, filgrastim, other medicines, foods, dyes, or preservatives -pregnant or trying to get pregnant -breast-feeding How should I use this medicine? This medicine is for injection under the skin. If you get this medicine at home, you will be taught how to prepare and give the pre-filled syringe or how to use the On-body Injector. Refer to the patient Instructions for Use for detailed instructions. Use exactly as directed. Tell your healthcare provider immediately if you suspect that the On-body Injector may not have performed as intended or if you suspect the use of the On-body Injector resulted in a missed or partial dose. It is important that you put your used needles and syringes in a special sharps container. Do not put them in a trash can. If you do not have a sharps container, call your pharmacist or healthcare provider to get one. Talk to your pediatrician regarding the use of this medicine in children. While this drug may be prescribed for selected  conditions, precautions do apply. Overdosage: If you think you have taken too much of this medicine contact a poison control center or emergency room at once. NOTE: This medicine is only for you. Do not share this medicine with others. What if I miss a dose? It is important not to miss your dose. Call your doctor or health care professional if you miss your dose. If you miss a dose due to an On-body Injector failure or leakage, a new dose should be administered as soon as possible using a single prefilled syringe for manual use. What may interact with this medicine? Interactions have not been studied. Give your health care provider a list of all the medicines, herbs, non-prescription drugs, or dietary supplements you use. Also tell them if you smoke, drink alcohol, or use illegal drugs. Some items may interact with your medicine. This list may not describe all possible interactions. Give your health care provider a list of all the medicines, herbs, non-prescription drugs, or dietary supplements you use. Also tell them if you smoke, drink alcohol, or use illegal drugs. Some items may interact with your medicine. What should I watch for while using this medicine? You may need blood work done while you are taking this medicine. If you are going to need a MRI, CT scan, or other procedure, tell your doctor that you are using this medicine (On-Body Injector only). What side effects may I notice from receiving this medicine? Side effects that you should report to your doctor or health care professional as soon as possible: -allergic reactions like skin rash, itching or hives, swelling of the face, lips, or tongue -dizziness -fever -pain, redness, or irritation at   site where injected -pinpoint red spots on the skin -red or dark-brown urine -shortness of breath or breathing problems -stomach or side pain, or pain at the shoulder -swelling -tiredness -trouble passing urine or change in the amount of  urine Side effects that usually do not require medical attention (report to your doctor or health care professional if they continue or are bothersome): -bone pain -muscle pain This list may not describe all possible side effects. Call your doctor for medical advice about side effects. You may report side effects to FDA at 1-800-FDA-1088. Where should I keep my medicine? Keep out of the reach of children. Store pre-filled syringes in a refrigerator between 2 and 8 degrees C (36 and 46 degrees F). Do not freeze. Keep in carton to protect from light. Throw away this medicine if it is left out of the refrigerator for more than 48 hours. Throw away any unused medicine after the expiration date. NOTE: This sheet is a summary. It may not cover all possible information. If you have questions about this medicine, talk to your doctor, pharmacist, or health care provider.  2018 [employer]vier/[employer] Standard (2016-04-04 12:58:03)  

## 2018-04-01 DIAGNOSIS — C251 Malignant neoplasm of body of pancreas: Secondary | ICD-10-CM | POA: Diagnosis not present

## 2018-04-01 DIAGNOSIS — K5909 Other constipation: Secondary | ICD-10-CM | POA: Diagnosis not present

## 2018-04-01 DIAGNOSIS — E1165 Type 2 diabetes mellitus with hyperglycemia: Secondary | ICD-10-CM | POA: Diagnosis not present

## 2018-04-01 DIAGNOSIS — I10 Essential (primary) hypertension: Secondary | ICD-10-CM | POA: Diagnosis not present

## 2018-04-02 ENCOUNTER — Telehealth: Payer: Self-pay

## 2018-04-02 NOTE — Telephone Encounter (Signed)
VM left to offer to schedule a follow up visit with Palliative Care

## 2018-04-06 ENCOUNTER — Telehealth: Payer: Self-pay

## 2018-04-06 ENCOUNTER — Ambulatory Visit (HOSPITAL_COMMUNITY)
Admission: RE | Admit: 2018-04-06 | Discharge: 2018-04-06 | Disposition: A | Discharge status: Home / Self Care | Payer: Medicare Other | Source: Ambulatory Visit | Attending: Hematology | Admitting: Hematology

## 2018-04-06 ENCOUNTER — Telehealth: Payer: Self-pay | Admitting: Hematology

## 2018-04-06 ENCOUNTER — Telehealth: Payer: Self-pay | Admitting: Primary Care

## 2018-04-06 DIAGNOSIS — C251 Malignant neoplasm of body of pancreas: Secondary | ICD-10-CM | POA: Insufficient documentation

## 2018-04-06 DIAGNOSIS — C259 Malignant neoplasm of pancreas, unspecified: Secondary | ICD-10-CM | POA: Diagnosis not present

## 2018-04-06 MED ORDER — SODIUM CHLORIDE (PF) 0.9 % IJ SOLN
INTRAMUSCULAR | Status: AC
  Filled 2018-04-06: qty 50

## 2018-04-06 MED ORDER — IOHEXOL 300 MG/ML  SOLN
100.0000 mL | Freq: Once | INTRAMUSCULAR | Status: AC | PRN
  Administered 2018-04-06: 100 mL via INTRAVENOUS

## 2018-04-06 NOTE — Telephone Encounter (Signed)
Patient's daughter Caryl Pina calls has questions regarding the procedure Dr. Burr Medico had mentioned - the celiac plexus block.  Return her call to [phone]

## 2018-04-06 NOTE — Telephone Encounter (Signed)
Multiple attempts to reach patient over last 3 weeks to schedule another home palliative care visit. VM left for patient to return call for appointment. Will continue to attempt to reach for scheduling.

## 2018-04-06 NOTE — Telephone Encounter (Signed)
I called Michael Maddox and left a message for her to call back.  I also tried patient's cell number, no answer.  Truitt Merle MD

## 2018-04-09 ENCOUNTER — Inpatient Hospital Stay: Payer: Medicare Other

## 2018-04-09 ENCOUNTER — Telehealth: Payer: Self-pay | Admitting: Hematology

## 2018-04-09 ENCOUNTER — Encounter: Payer: Self-pay | Admitting: Hematology

## 2018-04-09 ENCOUNTER — Inpatient Hospital Stay (HOSPITAL_BASED_OUTPATIENT_CLINIC_OR_DEPARTMENT_OTHER): Payer: Medicare Other | Admitting: Hematology

## 2018-04-09 VITALS — BP 120/73 | HR 95 | Temp 97.7°F | Resp 20 | Ht 71.0 in | Wt 116.1 lb

## 2018-04-09 DIAGNOSIS — C251 Malignant neoplasm of body of pancreas: Secondary | ICD-10-CM

## 2018-04-09 DIAGNOSIS — I10 Essential (primary) hypertension: Secondary | ICD-10-CM

## 2018-04-09 DIAGNOSIS — C7800 Secondary malignant neoplasm of unspecified lung: Secondary | ICD-10-CM

## 2018-04-09 DIAGNOSIS — C787 Secondary malignant neoplasm of liver and intrahepatic bile duct: Secondary | ICD-10-CM | POA: Diagnosis not present

## 2018-04-09 DIAGNOSIS — Z5111 Encounter for antineoplastic chemotherapy: Secondary | ICD-10-CM | POA: Diagnosis not present

## 2018-04-09 DIAGNOSIS — I2510 Atherosclerotic heart disease of native coronary artery without angina pectoris: Secondary | ICD-10-CM

## 2018-04-09 DIAGNOSIS — Z7189 Other specified counseling: Secondary | ICD-10-CM

## 2018-04-09 DIAGNOSIS — Z95828 Presence of other vascular implants and grafts: Secondary | ICD-10-CM

## 2018-04-09 DIAGNOSIS — E119 Type 2 diabetes mellitus without complications: Secondary | ICD-10-CM

## 2018-04-09 LAB — CMP (CANCER CENTER ONLY)
ALT: 16 U/L (ref 0–44)
AST: 18 U/L (ref 15–41)
Albumin: 4.2 g/dL (ref 3.5–5.0)
Alkaline Phosphatase: 86 U/L (ref 38–126)
Anion gap: 10 (ref 5–15)
BUN: 10 mg/dL (ref 8–23)
CHLORIDE: 99 mmol/L (ref 98–111)
CO2: 29 mmol/L (ref 22–32)
Calcium: 9.5 mg/dL (ref 8.9–10.3)
Creatinine: 0.75 mg/dL (ref 0.61–1.24)
GFR, Est AFR Am: >60 mL/min (ref >60)
GFR, Estimated: >60 mL/min (ref >60)
Glucose, Bld: 120 mg/dL — ABNORMAL HIGH (ref 70–99)
Potassium: 3.5 mmol/L (ref 3.5–5.1)
Sodium: 138 mmol/L (ref 135–145)
Total Bilirubin: 0.4 mg/dL (ref 0.3–1.2)
Total Protein: 7 g/dL (ref 6.5–8.1)

## 2018-04-09 LAB — CBC WITH DIFFERENTIAL (CANCER CENTER ONLY)
Abs Immature Granulocytes: 0.06 10*3/uL (ref 0.00–0.07)
Basophils Absolute: 0 10*3/uL (ref 0.0–0.1)
Basophils Relative: 0 %
EOS PCT: 0 %
Eosinophils Absolute: 0 10*3/uL (ref 0.0–0.5)
HCT: 35.7 % — ABNORMAL LOW (ref 39.0–52.0)
Hemoglobin: 11.7 g/dL — ABNORMAL LOW (ref 13.0–17.0)
Immature Granulocytes: 1 %
Lymphocytes Relative: 10 %
Lymphs Abs: 0.9 10*3/uL (ref 0.7–4.0)
MCH: 31.2 pg (ref 26.0–34.0)
MCHC: 32.8 g/dL (ref 30.0–36.0)
MCV: 95.2 fL (ref 80.0–100.0)
Monocytes Absolute: 0.6 10*3/uL (ref 0.1–1.0)
Monocytes Relative: 7 %
Neutro Abs: 7.6 10*3/uL (ref 1.7–7.7)
Neutrophils Relative %: 82 %
PLATELETS: 243 10*3/uL (ref 150–400)
RBC: 3.75 MIL/uL — ABNORMAL LOW (ref 4.22–5.81)
RDW: 13.3 % (ref 11.5–15.5)
WBC Count: 9.2 10*3/uL (ref 4.0–10.5)
nRBC: 0 % (ref 0.0–0.2)

## 2018-04-09 MED ORDER — SODIUM CHLORIDE 0.9 % IV SOLN
400.0000 mg/m2 | Freq: Once | INTRAVENOUS | Status: AC
  Administered 2018-04-09: 676 mg via INTRAVENOUS
  Filled 2018-04-09: qty 33.8

## 2018-04-09 MED ORDER — SODIUM CHLORIDE 0.9% FLUSH
10.0000 mL | Freq: Once | INTRAVENOUS | Status: AC
  Administered 2018-04-09: 10 mL
  Filled 2018-04-09: qty 10

## 2018-04-09 MED ORDER — SODIUM CHLORIDE 0.9 % IV SOLN
2400.0000 mg/m2 | INTRAVENOUS | Status: DC
  Administered 2018-04-09: 4050 mg via INTRAVENOUS
  Filled 2018-04-09: qty 81

## 2018-04-09 MED ORDER — DEXTROSE 5 % IV SOLN
Freq: Once | INTRAVENOUS | Status: AC
  Administered 2018-04-09: 11:00:00 via INTRAVENOUS
  Filled 2018-04-09: qty 250

## 2018-04-09 MED ORDER — SODIUM CHLORIDE 0.9 % IV SOLN
130.0000 mg/m2 | Freq: Once | INTRAVENOUS | Status: AC
  Administered 2018-04-09: 220 mg via INTRAVENOUS
  Filled 2018-04-09: qty 11

## 2018-04-09 MED ORDER — PALONOSETRON HCL INJECTION 0.25 MG/5ML
INTRAVENOUS | Status: AC
  Filled 2018-04-09: qty 5

## 2018-04-09 MED ORDER — ATROPINE SULFATE 1 MG/ML IJ SOLN
INTRAMUSCULAR | Status: AC
  Filled 2018-04-09: qty 1

## 2018-04-09 MED ORDER — OXALIPLATIN CHEMO INJECTION 100 MG/20ML
70.0000 mg/m2 | Freq: Once | INTRAVENOUS | Status: AC
  Administered 2018-04-09: 120 mg via INTRAVENOUS
  Filled 2018-04-09: qty 20

## 2018-04-09 MED ORDER — ATROPINE SULFATE 1 MG/ML IJ SOLN
0.5000 mg | Freq: Once | INTRAMUSCULAR | Status: AC | PRN
  Administered 2018-04-09: 0.5 mg via INTRAVENOUS

## 2018-04-09 MED ORDER — DEXAMETHASONE SODIUM PHOSPHATE 10 MG/ML IJ SOLN
INTRAMUSCULAR | Status: AC
  Filled 2018-04-09: qty 1

## 2018-04-09 MED ORDER — DEXAMETHASONE SODIUM PHOSPHATE 10 MG/ML IJ SOLN
10.0000 mg | Freq: Once | INTRAMUSCULAR | Status: AC
  Administered 2018-04-09: 10 mg via INTRAVENOUS

## 2018-04-09 MED ORDER — PALONOSETRON HCL INJECTION 0.25 MG/5ML
0.2500 mg | Freq: Once | INTRAVENOUS | Status: AC
  Administered 2018-04-09: 0.25 mg via INTRAVENOUS

## 2018-04-09 NOTE — Telephone Encounter (Signed)
Printed avs and calendar °

## 2018-04-09 NOTE — Patient Instructions (Signed)
[facility] Discharge Instructions for Patients Receiving Chemotherapy  Today you received the following chemotherapy agents Oxaliplatin, Irinotecan, Leucovorin, and 5FU  To help prevent nausea and vomiting after your treatment, we encourage you to take your nausea medication as directed   If you develop nausea and vomiting that is not controlled by your nausea medication, call the clinic.   BELOW ARE SYMPTOMS THAT SHOULD BE REPORTED IMMEDIATELY:  *FEVER GREATER THAN 100.5 F  *CHILLS WITH OR WITHOUT FEVER  NAUSEA AND VOMITING THAT IS NOT CONTROLLED WITH YOUR NAUSEA MEDICATION  *UNUSUAL SHORTNESS OF BREATH  *UNUSUAL BRUISING OR BLEEDING  TENDERNESS IN MOUTH AND THROAT WITH OR WITHOUT PRESENCE OF ULCERS  *URINARY PROBLEMS  *BOWEL PROBLEMS  UNUSUAL RASH Items with * indicate a potential emergency and should be followed up as soon as possible.  Feel free to call the clinic should you have any questions or concerns. The clinic phone number is [phone].  Please show the CHEMO ALERT CARD at check-in to the Emergency Department and triage nurse.   

## 2018-04-09 NOTE — Progress Notes (Addendum)
[place]   [phone] [phone]   Clinic Follow up Note   Patient Care Team: Nolene Ebbs, MD as PCP - General (Internal Medicine) Lorretta Harp, MD as PCP - Cardiology (Cardiology) Carol Ada, MD as Consulting Physician (Gastroenterology) Truitt Merle, MD as Consulting Physician (Hematology) Estevan Oaks, NP as Nurse Practitioner (Hospice and Palliative Medicine)  Date of Service:  04/09/2018  CHIEF COMPLAINT: F/u of Pancreatic Cancer, unresectable   SUMMARY OF ONCOLOGIC HISTORY: Oncology History   Cancer Staging Malignant neoplasm of body of pancreas [facility]) Staging form: Exocrine Pancreas, AJCC 8th Edition - Clinical stage from 12/19/2017: Stage IIA (cT3, cN0, cM0) - Signed by Truitt Merle, MD on 01/13/2018       Malignant neoplasm of body of pancreas ([place])   12/11/2017 Imaging    CT AP WITH CONTRAST IMPRESSION: Focal area of abnormal low attenuation in the pancreatic body measuring 3.7 x 2.9 cm with cut off of the duct most concerning for primary pancreatic neoplasm.  2 small low-attenuation foci identified within the liver which are nonspecific however could represent metastatic disease; one is 1.0 cm complex lesion within the hepatic dome, the other area is a low-attenuation focus adjacent to the falciform ligament likely representing a perfusion anomaly or possible local fatty infiltration  Extensive atherosclerotic changes in the abdominal aorta, iliac vessels, splenic vessels.  Occlusion of the right and possibly left iliac.  Suspected chronic re-constitution of flow identified within the femoral vessels bilaterally.  Prostate brachytherapy consistent with history of malignancy  No evidence of small bowel obstruction.  Findings suggestive of constipation     12/19/2017 Procedure    EUS biopsy: In the body of the pancreas there was a large 20 x 30 mm, approximately, hypoechoic mass. It had irregular margin, but it was round/oval  in appearance. There was pancreatic tail atrophy, but the PD was dilated up to 7.6 mm. The celiac axis was clearly viewed and there was no evidence of invasion. In the head of the pancreas a 1 cm lymph node was identified and the pancreas was lobular in the head and uncinate process. The left adrenal was clearly identified and it was enlarged, but there was no evidence of any masses.    12/19/2017 Initial Biopsy    Diagnosis FINE NEEDLE ASPIRATION,ENDOSCOPIC, PANCREAS BODY (SPECIMEN 1 OF 1 COLLECTED 12/19/17): MALIGNANT CELLS CONSISTENT WITH ADENOCARCINOMA.    12/19/2017 Initial Diagnosis    Malignant neoplasm of body of pancreas ([place])    12/19/2017 Cancer Staging    Staging form: Exocrine Pancreas, AJCC 8th Edition - Clinical stage from 12/19/2017: Stage IIA (cT3, cN0, cM0) - Signed by Truitt Merle, MD on 01/13/2018    01/05/2018 Imaging    01/05/2018 MRI Abdomen IMPRESSION: 1. Poorly marginated hypoenhancing 4.2 x 2.5 x 5.2 cm pancreatic body mass with pancreatic tail atrophy and duct dilation, compatible with known pancreatic adenocarcinoma. Pancreatic tumor appears locally advanced with significant vascular involvement as detailed. No biliary ductal dilatation. 2. No abdominal adenopathy. 3. Two similar subcentimeter enhancing left liver lobe masses as detailed, which are too small to accurately characterize on this mildly motion degraded scan. Differential includes small metastases or hemangiomas. Suggest follow-up MRI abdomen without and with IV contrast in 3 months. These lesions are probably too small to characterize by PET-CT. 4.  Aortic Atherosclerosis (ICD10-I70.0).    01/06/2018 Imaging    01/06/2018 CT Chest IMPRESSION: 1. Two non-specific small left lower lobe pulmonary nodules. Otherwise, no evidence of metastatic disease within  the chest. 2.  Aortic Atherosclerosis (ICD10-I70.0).  Emphysema (ICD10-J43.9).    01/13/2018 Tumor Marker    Tumor Marker Results for Michael Maddox, Michael Maddox (MRN [id number]) as of 01/13/2018 09:17  Ref. Range 12/30/2017 14:59  CA 19-9 Latest Ref Range: 0 - 35 U/mL 1,263 (H)      01/21/2018 -  Chemotherapy    FOLFIRINOX every 2 weeks starting 01/21/18. Increased to full dose oxaliplatin on cycle 2    02/03/2018 Genetic Testing    Negative genetic testing on the [facility] Multi-Cancer Panel + Brain Cancer Panel. The Multi-Cancer Panel offered by [facility] includes sequencing and/or deletion duplication testing of the following 84 genes: AIP, ALK, APC, ATM, AXIN2,BAP1,  BARD1, BLM, BMPR1A, BRCA1, BRCA2, BRIP1, CASR, CDC73, CDH1, CDK4, CDKN1B, CDKN1C, CDKN2A (p14ARF), CDKN2A (p16INK4a), CEBPA, CHEK2, CTNNA1, DICER1, DIS3L2, EGFR (c.2369C>T, p.Thr790Met variant only), EPCAM (Deletion/duplication testing only), FH, FLCN, GATA2, GPC3, GREM1 (Promoter region deletion/duplication testing only), HOXB13 (c.251G>A, p.Gly84Glu), HRAS, KIT, MAX, MEN1, MET, MITF (c.952G>A, p.Glu318Lys variant only), MLH1, MSH2, MSH3, MSH6, MUTYH, NBN, NF1, NF2, NTHL1, PALB2, PDGFRA, PHOX2B, PMS2, POLD1, POLE, POT1, PRKAR1A, PTCH1, PTEN, RAD50, RAD51C, RAD51D, RB1, RECQL4, RET, RUNX1, SDHAF2, SDHA (sequence changes only), SDHB, SDHC, SDHD, SMAD4, SMARCA4, SMARCB1, SMARCE1, STK11, SUFU, TERC, TERT, TMEM127, TP53, TSC1, TSC2, VHL, WRN and WT1.  The CNS/Brain Cancer Panel offered by [facility] includes sequencing and/or deletion duplication testing of the following 42 genes: ALK, APC, BAP1, BARD1, CDK4, CDKN2A, DICER1, EPCAM, EZH2, GPC3, HRAS, KIF1B, MEN1, MLH1, MSH2, MSH6, NF1, NF2, PHOX2B, PMS2, POT1, PRKAR1A, PTCH2, PTEN, RB1, SMARCA4, SMARCB1, SMARCE1, SUFU, TP53, TSC1, TSC2, and VHL.   The report date is 02/03/2018.     04/06/2018 Imaging    -CT AP W Constrast 04/06/18 IMPRESSION: 1. No significant change in large pancreatic body mass with pancreatic ductal obstruction. No biliary obstruction 2. No change in size of peripancreatic mass adjacent to uncinate consistent with nodal metastasis or  direct tumor extension. 3. Vascular involvement of the celiac trunk and SMA by the pancreatic cancer similar to prior. 4. Hypodense lesion along the gallbladder fossa not seen on comparison MRI is indeterminate. Consider follow-up MRI for further characterization. Two other hypodense lesions in the liver appear similar to comparison exam MRI. 5. Obstruction of the iliac arteries by atherosclerotic disease. Reconstitution of the femoral arteries by collateral flow.      CURRENT THERAPY:  First line FOLFIRINOX every 2 weeks starting 01/21/18  INTERVAL HISTORY:  Michael Maddox is here for a follow up and ongoing treatment. He presents to the clinic today with his wife and sister. He notes he is doing alright overall. He denies diarrhea or abdominal pain from last treatment. He does not constipation from it. If he does not get a BM in 2 days he will take stool softener. He notes appetite will decrease after chemo due to taste change. He notes with each small meal he takes a boost bottle. He takes 3 meals a day and may try to snack. He notes localized chest tightness lasting for a few minutes then will dissipate. He notes having triple bypass ans stroke in the past. He does not take nitro for this pain as he could not tolerate. He is being seen by cardiologist that has him on Plavix. He notes no change in his abdominal pain. He wonders can he increase his pain medication before considering celiac block. He notes he never got approved for OxyContin. He has MS contin 18m BID. It no longer makes him hallucinate.  He takes oxycodone about twice a day but takes time for his pain to go away.      REVIEW OF SYSTEMS:   Constitutional: Denies fevers, chills or abnormal weight loss (+) appetite loss from taste change  Eyes: Denies blurriness of vision Ears, nose, mouth, throat, and face: Denies mucositis or sore throat Respiratory: Denies cough, dyspnea or wheezes Cardiovascular: Denies palpitation or  lower extremity swelling (+) chest tightness Gastrointestinal:  Denies nausea, heartburn (+) constipation (+)abdomina pain  Skin: Denies abnormal skin rashes Lymphatics: Denies new lymphadenopathy or easy bruising Neurological:Denies numbness, tingling or new weaknesses Behavioral/Psych: Mood is stable, no new changes  All other systems were reviewed with the patient and are negative.  MEDICAL HISTORY:  Past Medical History:  Diagnosis Date  . Arthritis   . Carotid artery disease ([place]) followed by dr berry--- per last duplex [id number]  LICA 33-29%, bilateral ECA >50%,  patent RICA post intervention   hx acute high grade stenosis RICA >80% 02-22-2009 in setting of  stroke  s/p  right carotid endarterectomy 02-28-2009/    . Claudication of both lower extremities ([place])    due to PAD ----  right > left  . COPD (chronic obstructive pulmonary disease) ([place])   . Coronary artery disease CARDIOLOGIST-  DR BERRY   hx positive myoview 07-29-2013;  08-16-2013 per cardiac cath, severe 3V CAD w/ 60% subclavian stenosis;  08-23-2013   s/p  CABG x3  (LIMA to LAD, SVG to OM1, SVG to PDA)  . CVA (cerebral vascular accident) ([place]) 02/22/2009   acute infarct right hemisphere w/ severe RICA stentosis----  per pt no residuals  . Family history of brain cancer   . Family history of breast cancer   . GERD (gastroesophageal reflux disease)   . HTN (hypertension)   . Hyperlipidemia   . Hyperplasia of prostate with lower urinary tract symptoms (LUTS)   . Peripheral arterial disease ([place]) followed by dr berry--- last dobbler ABIs bilateral SFA occlusions (pt is scheduled for intervention 05-08-2017   hx PTA and stenting to bilateral SFA 2002 and PTA w/ stenting left common iliac artery and for in-stent restenosis 2003 Left SFA and 2005  stenting right SFA for in-stent restenosis  . Prostate cancer [facility]) urologist-  dr ottelin/  oncologist-  dr Tammi Klippel   dx 11-20-2016-- Stage T1c, Gleason 4+3, PSA 5.73, vol  35.95cc--- scheduled for radioactive seed implants 04-11-2017  . 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 S/P peripheral artery angioplasty with stent placement    bilateral SFA 2002;  in-stenosis bilateral SFA  (right 2003, left 2005) and left CIA 2003  . Shortness of breath   . Type II diabetes mellitus ([place])   . Weak urinary stream   . Wears glasses     SURGICAL HISTORY: Past Surgical History:  Procedure Laterality Date  . ABDOMINAL AORTOGRAM N/A 05/08/2017   Procedure: ABDOMINAL AORTOGRAM;  Surgeon: Lorretta Harp, MD;  Location: [place] [facility];  Service: Cardiovascular;  Laterality: N/A;  . CARDIAC CATHETERIZATION  12-01-2000    dr berry    non-critial CAD & nonischemic cardiolite-- mLAD 50%, mLCx 70%, prox. to mid RCA 75% focal midsegment 80-85%  . CARDIOVASCULAR STRESS TEST  07-29-2013    dr berry   High risk nuclear study w/ inferoseptal ischemia/  normal LV function and wall motion,  nuclear study ef 52%  . CAROTID ENDARTERECTOMY Right 02-28-2009    dr c.  dickson  [facility]   patch angioplasty  . COLONOSCOPY    . CORONARY ARTERY BYPASS GRAFT N/A 08/23/2013   Procedure: CORONARY ARTERY BYPASS GRAFTING (CABG);  Surgeon: Melrose Nakayama, MD;  Location: [place];  Service: Open Heart Surgery;  Laterality: N/A;  Coronary Artery Bypass graft times three using left internal mammary artery and left leg saphenous vein   . CYSTOSCOPY N/A 04/11/2017   Procedure: CYSTOSCOPY FLEXIBLE;  Surgeon: Kathie Rhodes, MD;  Location: [facility];  Service: Urology;  Laterality: N/A;  no seeds found in bladder  . ENDOVASCULAR STENT INSERTION Bilateral right 12-25-2000 ;  left 03-02-2001   dr berry   PTA and stenting to bilateral SFA  . ENDOVASCULAR STENT INSERTION Bilateral right 08-30-2003 ;  left 11-10-2001    dr berry   PTA and stenting for in-stent restenosis right SFA 08-30-2003:  PTA and stenting distal left common iliac artery and in-stent restenosis left  SFA 11-10-2001  . FINE NEEDLE ASPIRATION N/A 12/19/2017   Procedure: FINE NEEDLE ASPIRATION (FNA) LINEAR;  Surgeon: Carol Ada, MD;  Location: [facility];  Service: Endoscopy;  Laterality: N/A;  . INTRAOPERATIVE TRANSESOPHAGEAL ECHOCARDIOGRAM N/A 08/23/2013   Procedure: INTRAOPERATIVE TRANSESOPHAGEAL ECHOCARDIOGRAM;  Surgeon: Melrose Nakayama, MD;  Location: [place];  Service: Open Heart Surgery;  Laterality: N/A;  . IR IMAGING GUIDED PORT INSERTION  01/20/2018  . LEFT HEART CATHETERIZATION WITH CORONARY ANGIOGRAM N/A 08/16/2013   Procedure: LEFT HEART CATHETERIZATION WITH CORONARY ANGIOGRAM;  Surgeon: Lorretta Harp, MD;  Location: [facility];  Service: Cardiovascular;  Laterality: N/A;   severe 3V CAD and 60% subclavian sternosis  . LOWER EXTREMITY ANGIOGRAM N/A 08/16/2013   Procedure: LOWER EXTREMITY ANGIOGRAM;  Surgeon: Lorretta Harp, MD;  Location: [facility] CATH LAB;  Service: Cardiovascular;  Laterality: N/A;  . LOWER EXTREMITY ANGIOGRAPHY Bilateral 05/08/2017   Procedure: Lower Extremity Angiography;  Surgeon: Lorretta Harp, MD;  Location: [place] [facility];  Service: Cardiovascular;  Laterality: Bilateral;  . PERIPHERAL VASCULAR BALLOON ANGIOPLASTY Left 05/08/2017   Procedure: PERIPHERAL VASCULAR BALLOON ANGIOPLASTY;  Surgeon: Lorretta Harp, MD;  Location: [place] [facility];  Service: Cardiovascular;  Laterality: Left;  COMMON ILIAC  . RADIOACTIVE SEED IMPLANT N/A 04/11/2017   Procedure: RADIOACTIVE SEED IMPLANT/BRACHYTHERAPY IMPLANT;  Surgeon: Kathie Rhodes, MD;  Location: [facility];  Service: Urology;  Laterality: N/A;    67   seeds implanted  . SPACE OAR INSTILLATION N/A 04/11/2017   Procedure: SPACE OAR INSTILLATION;  Surgeon: Kathie Rhodes, MD;  Location: [facility];  Service: Urology;  Laterality: N/A;  . TRANSTHORACIC ECHOCARDIOGRAM  07/15/2013   ef 26-71%, grade 1 diastolic dysfunction/  trivial MR, mild to moderate PR, mild TR   . UPPER  ESOPHAGEAL ENDOSCOPIC ULTRASOUND (EUS) N/A 12/19/2017   Procedure: UPPER ESOPHAGEAL ENDOSCOPIC ULTRASOUND (EUS);  Surgeon: Carol Ada, MD;  Location: [facility];  Service: Endoscopy;  Laterality: N/A;    I have reviewed the social history and family history with the patient and they are unchanged from previous note.  ALLERGIES:  has No Known Allergies.  MEDICATIONS:  Current Outpatient Medications  Medication Sig Dispense Refill  . aspirin 81 MG chewable tablet Chew 1 tablet (81 mg total) by mouth daily.    . chlorproMAZINE (THORAZINE) 25 MG tablet Take 1 tablet (25 mg total) by mouth 3 (three) times daily as needed for hiccoughs. 20 tablet 0  . cilostazol (PLETAL) 50 MG tablet Take 1 tablet (50 mg total)  by mouth 2 (two) times daily. 60 tablet 6  . clopidogrel (PLAVIX) 75 MG tablet Take 75 mg by mouth at bedtime.     Marland Kitchen dexamethasone (DECADRON) 4 MG tablet Take 1 tab daily with breakfast on day 3-5 after chemo 30 tablet 1  . dexlansoprazole (DEXILANT) 60 MG capsule Take 60 mg by mouth daily as needed (for reflux.).     Marland Kitchen doxycycline (VIBRAMYCIN) 100 MG capsule Take 100 mg by mouth daily as needed for itching.  1  . HYDROcodone-acetaminophen (NORCO/VICODIN) 5-325 MG tablet Take 1 tablet by mouth every 6 (six) hours as needed for severe pain. 40 tablet 0  . hydrocortisone (ANUSOL-HC) 2.5 % rectal cream Place 1 application rectally 2 (two) times daily as needed for hemorrhoids. 30 g 1  . hydrocortisone 2.5 % ointment Apply 1 application topically 2 (two) times daily as needed (for skin irritation).   0  . lidocaine-prilocaine (EMLA) cream Apply 1 application topically as needed (local anesthesia).    . LORazepam (ATIVAN) 1 MG tablet Take 1 tablet (1 mg total) by mouth at bedtime. 5 tablet 0  . metFORMIN (GLUCOPHAGE) 500 MG tablet Take 500 mg by mouth at bedtime.   2  . metoprolol succinate (TOPROL-XL) 25 MG 24 hr tablet Take 25 mg by mouth at bedtime.     . mirtazapine (REMERON) 7.5 MG  tablet Take 1 tablet (7.5 mg total) by mouth at bedtime. 30 tablet 1  . morphine (MS CONTIN) 15 MG 12 hr tablet Take 1 tablet (15 mg total) by mouth every 12 (twelve) hours. 30 tablet 0  . Multiple Vitamins-Minerals (MULTIVITAMIN WITH MINERALS) tablet Take 1 tablet by mouth daily.    . ondansetron (ZOFRAN) 8 MG tablet Take 1 tablet (8 mg total) by mouth 2 (two) times daily as needed for refractory nausea / vomiting. Start on day 3 after chemotherapy. 30 tablet 1  . oxyCODONE (OXY IR/ROXICODONE) 5 MG immediate release tablet Take 1 tablet (5 mg total) by mouth every 6 (six) hours as needed for severe pain. 90 tablet 0  . prochlorperazine (COMPAZINE) 10 MG tablet Take 1 tablet (10 mg total) by mouth every 6 (six) hours as needed (NAUSEA). 30 tablet 1  . simvastatin (ZOCOR) 20 MG tablet Take 20 mg by mouth at bedtime.     . tamsulosin (FLOMAX) 0.4 MG CAPS capsule Take 0.4 mg by mouth every evening.  11  . traMADol (ULTRAM) 50 MG tablet Take 50 mg by mouth every 6 (six) hours as needed (pain).      No current facility-administered medications for this visit.    Facility-Administered Medications Ordered in Other Visits  Medication Dose Route Frequency Provider Last Rate Last Dose  . atropine injection 0.5 mg  0.5 mg Intravenous Once PRN Truitt Merle, MD      . fluorouracil (ADRUCIL) 4,050 mg in sodium chloride 0.9 % 69 mL chemo infusion  2,400 mg/m2 (Treatment Plan Recorded) Intravenous 1 day or 1 dose Truitt Merle, MD      . irinotecan (CAMPTOSAR) 220 mg in sodium chloride 0.9 % 500 mL chemo infusion  130 mg/m2 (Treatment Plan Recorded) Intravenous Once Truitt Merle, MD      . leucovorin 676 mg in sodium chloride 0.9 % 250 mL infusion  400 mg/m2 (Treatment Plan Recorded) Intravenous Once Truitt Merle, MD      . oxaliplatin (ELOXATIN) 120 mg in dextrose 5 % 500 mL chemo infusion  70 mg/m2 (Treatment Plan Recorded) Intravenous Once Truitt Merle, MD      .  sodium chloride flush (NS) 0.9 % injection 10 mL  10 mL  Intracatheter Once Truitt Merle, MD        PHYSICAL EXAMINATION: ECOG PERFORMANCE STATUS: 2 - Symptomatic, <50% confined to bed  Vitals:   04/09/18 0928  BP: 120/73  Pulse: 95  Resp: 20  Temp: 97.7 F (36.5 C)  SpO2: 100%   Filed Weights   04/09/18 0928  Weight: 116 lb 1.6 oz (52.7 kg)    GENERAL:alert, no distress and comfortable SKIN: skin color, texture, turgor are normal, no rashes or significant lesions EYES: normal, Conjunctiva are pink and non-injected, sclera clear OROPHARYNX:no exudate, no erythema and lips, buccal mucosa, and tongue normal  NECK: supple, thyroid normal size, non-tender, without nodularity LYMPH:  no palpable lymphadenopathy in the cervical, axillary or inguinal LUNGS: clear to auscultation and percussion with normal breathing effort HEART: regular rate & rhythm and no murmurs and no lower extremity edema ABDOMEN:abdomen soft, non-tender and normal bowel sounds Musculoskeletal:no cyanosis of digits and no clubbing  NEURO: alert & oriented x 3 with fluent speech, no focal motor/sensory deficits  LABORATORY DATA:  I have reviewed the data as listed CBC Latest Ref Rng & Units 04/09/2018 03/26/2018 03/05/2018  WBC 4.0 - 10.5 K/uL 9.2 6.5 8.0  Hemoglobin 13.0 - 17.0 g/dL 11.7(L) 11.9(L) 11.1(L)  Hematocrit 39.0 - 52.0 % 35.7(L) 36.9(L) 33.5(L)  Platelets 150 - 400 K/uL 243 278 245     CMP Latest Ref Rng & Units 04/09/2018 03/26/2018 03/05/2018  Glucose 70 - 99 mg/dL 120(H) 286(H) 196(H)  BUN 8 - 23 mg/dL _0 Creatinine 0.61 - 1.24 mg/dL 0.75 1.10 0.80  Sodium 135 - 145 mmol/L 138 136 140  Potassium 3.5 - 5.1 mmol/L 3.5 4.1 4.1  Chloride 98 - 111 mmol/L 99 101 104  CO2 22 - 32 mmol/L _1 Calcium 8.9 - 10.3 mg/dL 9.5 9.7 9.2  Total Protein 6.5 - 8.1 g/dL 7.0 7.1 6.6  Total Bilirubin 0.3 - 1.2 mg/dL 0.4 0.3 0.3  Alkaline Phos 38 - 126 U/L 86 80 77  AST 15 - 41 U/L _2 ALT 0 - 44 U/L _3 RADIOGRAPHIC STUDIES: I have  personally reviewed the radiological images as listed and agreed with the findings in the report. No results found.   ASSESSMENT & PLAN:  Michael Maddox is a 67 y.o. male with   1. Adenocarcinoma of body of pancreas, cT3N0Mx, unresectable, with indeterminate lung nodes and liver lesions, MSI unknown (insufficient tissue), BRCA mutation (-) -He was diagnosed in 11/2017. Given his tumor is unresectable due to vascular invasion, and indeterminate lung and liver lesions, his cancer is not curable, but still treatable. Goal of care is to control his disease and prolong his life.  -He is on First-line FOLFIRINOX every 2 weeks since starting 01/21/18. After dose reduction he is tolerating treatment moderately well with constipation and low appetite.  -His CT AP from 04/06/18 shows stable disease, his tumor is still not resectable. There is 1 new spot in liver and is indeterminate. Will continue to monitor it.  I reviewed the image myself, and agree with the radiology interpretation.  I discussed with patient and his family members in details. -His cancer is responding to treatment as it is currently controlling disease. This will not cure his disease so he will continue with long term treatment as long as he can tolerate. Will dose adjust or give  chemo break as needed.  -I again discussed the unresectable nature of his cancer, but I mentioned second surgical opinion at [facility] or [facility]. He will think about this.  -I also discussed the option of clinical trails incase if he fails standard regimens.  We also discussed the general requirement for clinical trial, and different face studies.  If he is interested, I will start looking for trial options at [facility], [place] or [facility]. -Lab reviewed, CBC WNL except mild anemia. CMP and Ca 19/9 is still pending. Overall adequate to proceed with FOLFIRINOX at current dose.  -He knows to contact clinic with any concerns.  -Due to the upcoming Christmas and New Year's, he will  take a 1 week chemo break -F/u in 3 weeks  -I spoke with pathology today, due to insufficient tissue, MMR was not able to be tested on his biopsy.   2. Constipation and abdominal pain  -He has Senakot-S and Miraliax for constipation. His BM are better controlling with lactulose, will continue.   -previously on alternating dose of hydrocodone and oxycodone. Pain was not controlled.  -He is currently on MS Contin 35m once a day and Oxycodone about BID as needed. He would like to increase his pain medication. He can increase Oxycodone to 1.5-2 tabs as needed for break through pain, he is tolerating MS Contin better with less hallucination, will increase to twice daily -If can not tolerate MS Contin, I will appeal his OxyContin denial.  -He is still considering celiac block but would like to continue pain medication for now.     3. Low appetite, weight loss, Nausea  -Secondary to chemotherapy and underlying malignancy.  He will continue to follow up with Dietician.  -On Mirtazapine 182mdaily. His weight is still trending down.   -He will continue 3-5 small meals daily with Glucerna -I reviewed antiemetic use with Zofran, Compazine and dexa. Nausea is stable.    4. Depression and Anxiety -Pt denies depression, but mood is down. Stable  -He has Ativan for anxiety, mostly on treatment days     5. Adenocarcinoma of the prostate stage T1c, s/p radioactive seed implant per Dr. MaTammi Klippeln 04/11/17 -Latest gleason score 4+3 and PSA 5.73 on 11/20/2016 -He is being followed by Dr. OtKarsten Ro 6.HTN, HL,DM -On Zocor, metoprolol and metformin -followed by PCP Dr. AvJeanie CooksHTN is well controlled.  -He iscurrentlyon sliding scale insulin and metformin for DM.  -Stable.   7. TIA in 2010 s/p endarterectomy, CAD s/p CABG in 2015, PVD -requiring revascularization and stenting in the LEs.  -He is currently on anticoagulation with plavixand aspirin  -He is being followed by cardiologist  Dr. BeGwenlyn Found  8. Genetics, Negative, including BRCA1 and BRCA2  9. Goal of care discussion  -Patient understands his cancer is incurable at this stage, and the goal of care is palliative to prolong his life and preserve his quality of life  -he is full code now    PLAN: -Labs reviewed and adequate to proceed with FOLFIRINOX today, same dose  -Lab, flush, f/u and chemo FOLFIRINOX in 3, 5, and 7 weeks      No problem-specific Assessment & Plan notes found for this encounter.   No orders of the defined types were placed in this encounter.  Patient's wife and sister had many questions, including a list of questions from his daughter.  All questions were answered. The patient knows to call the clinic with any problems, questions or concerns. No barriers to learning  was detected. I spent 30 minutes counseling the patient face to face. The total time spent in the appointment was 40 minutes and more than 50% was on counseling and review of test results     Truitt Merle, MD 04/09/2018   I, Joslyn Devon, am acting as scribe for Truitt Merle, MD.   I have reviewed the above documentation for accuracy and completeness, and I agree with the above.

## 2018-04-10 LAB — CANCER ANTIGEN 19-9: CA 19-9: 1342 U/mL — ABNORMAL HIGH (ref 0–35)

## 2018-04-11 ENCOUNTER — Inpatient Hospital Stay: Payer: Medicare Other

## 2018-04-11 VITALS — BP 92/62 | HR 95 | Temp 98.4°F | Resp 16

## 2018-04-11 DIAGNOSIS — C251 Malignant neoplasm of body of pancreas: Secondary | ICD-10-CM

## 2018-04-11 DIAGNOSIS — Z5111 Encounter for antineoplastic chemotherapy: Secondary | ICD-10-CM | POA: Diagnosis not present

## 2018-04-11 MED ORDER — PEGFILGRASTIM-CBQV 6 MG/0.6ML ~~LOC~~ SOSY
6.0000 mg | PREFILLED_SYRINGE | Freq: Once | SUBCUTANEOUS | Status: AC
  Administered 2018-04-11: 6 mg via SUBCUTANEOUS

## 2018-04-11 MED ORDER — PEGFILGRASTIM-CBQV 6 MG/0.6ML ~~LOC~~ SOSY
PREFILLED_SYRINGE | SUBCUTANEOUS | Status: AC
  Filled 2018-04-11: qty 0.6

## 2018-04-11 MED ORDER — HEPARIN SOD (PORK) LOCK FLUSH 100 UNIT/ML IV SOLN
500.0000 [IU] | Freq: Once | INTRAVENOUS | Status: AC | PRN
  Administered 2018-04-11: 500 [IU]
  Filled 2018-04-11: qty 5

## 2018-04-11 MED ORDER — SODIUM CHLORIDE 0.9% FLUSH
10.0000 mL | INTRAVENOUS | Status: DC | PRN
  Administered 2018-04-11: 10 mL
  Filled 2018-04-11: qty 10

## 2018-04-11 NOTE — Patient Instructions (Signed)
Pegfilgrastim injection  What is this medicine?  PEGFILGRASTIM (PEG fil gra stim) is a long-acting granulocyte colony-stimulating factor that stimulates the growth of neutrophils, a type of white blood cell important in the body's fight against infection. It is used to reduce the incidence of fever and infection in patients with certain types of cancer who are receiving chemotherapy that affects the bone marrow, and to increase survival after being exposed to high doses of radiation.  This medicine may be used for other purposes; ask your health care provider or pharmacist if you have questions.  COMMON BRAND NAME(S): Fulphila, Neulasta, UDENYCA  What should I tell my health care provider before I take this medicine?  They need to know if you have any of these conditions:  -kidney disease  -latex allergy  -ongoing radiation therapy  -sickle cell disease  -skin reactions to acrylic adhesives (On-Body Injector only)  -an unusual or allergic reaction to pegfilgrastim, filgrastim, other medicines, foods, dyes, or preservatives  -pregnant or trying to get pregnant  -breast-feeding  How should I use this medicine?  This medicine is for injection under the skin. If you get this medicine at home, you will be taught how to prepare and give the pre-filled syringe or how to use the On-body Injector. Refer to the patient Instructions for Use for detailed instructions. Use exactly as directed. Tell your healthcare provider immediately if you suspect that the On-body Injector may not have performed as intended or if you suspect the use of the On-body Injector resulted in a missed or partial dose.  It is important that you put your used needles and syringes in a special sharps container. Do not put them in a trash can. If you do not have a sharps container, call your pharmacist or healthcare provider to get one.  Talk to your pediatrician regarding the use of this medicine in children. While this drug may be prescribed for  selected conditions, precautions do apply.  Overdosage: If you think you have taken too much of this medicine contact a poison control center or emergency room at once.  NOTE: This medicine is only for you. Do not share this medicine with others.  What if I miss a dose?  It is important not to miss your dose. Call your doctor or health care professional if you miss your dose. If you miss a dose due to an On-body Injector failure or leakage, a new dose should be administered as soon as possible using a single prefilled syringe for manual use.  What may interact with this medicine?  Interactions have not been studied.  Give your health care provider a list of all the medicines, herbs, non-prescription drugs, or dietary supplements you use. Also tell them if you smoke, drink alcohol, or use illegal drugs. Some items may interact with your medicine.  This list may not describe all possible interactions. Give your health care provider a list of all the medicines, herbs, non-prescription drugs, or dietary supplements you use. Also tell them if you smoke, drink alcohol, or use illegal drugs. Some items may interact with your medicine.  What should I watch for while using this medicine?  You may need blood work done while you are taking this medicine.  If you are going to need a MRI, CT scan, or other procedure, tell your doctor that you are using this medicine (On-Body Injector only).  What side effects may I notice from receiving this medicine?  Side effects that you should report to   your doctor or health care professional as soon as possible:  -allergic reactions like skin rash, itching or hives, swelling of the face, lips, or tongue  -back pain  -dizziness  -fever  -pain, redness, or irritation at site where injected  -pinpoint red spots on the skin  -red or dark-brown urine  -shortness of breath or breathing problems  -stomach or side pain, or pain at the shoulder  -swelling  -tiredness  -trouble passing urine or  change in the amount of urine  Side effects that usually do not require medical attention (report to your doctor or health care professional if they continue or are bothersome):  -bone pain  -muscle pain  This list may not describe all possible side effects. Call your doctor for medical advice about side effects. You may report side effects to FDA at 1-800-FDA-1088.  Where should I keep my medicine?  Keep out of the reach of children.  If you are using this medicine at home, you will be instructed on how to store it. Throw away any unused medicine after the expiration date on the label.  NOTE: This sheet is a summary. It may not cover all possible information. If you have questions about this medicine, talk to your doctor, pharmacist, or health care provider.   2019 [employer]vier/Gold Standard (2017-07-14 16:57:08)

## 2018-04-20 ENCOUNTER — Telehealth: Payer: Self-pay

## 2018-04-20 ENCOUNTER — Other Ambulatory Visit: Payer: Self-pay | Admitting: Hematology

## 2018-04-20 MED ORDER — MORPHINE SULFATE ER 15 MG PO TBCR: 15.0000 mg | EXTENDED_RELEASE_TABLET | Freq: Two times a day (BID) | ORAL | 0 refills | Status: DC

## 2018-04-20 NOTE — Telephone Encounter (Signed)
Patient needs refill on MS Contin sent to [facility].

## 2018-04-27 ENCOUNTER — Other Ambulatory Visit: Payer: Self-pay | Admitting: Hematology

## 2018-04-27 ENCOUNTER — Telehealth: Payer: Self-pay

## 2018-04-27 DIAGNOSIS — C251 Malignant neoplasm of body of pancreas: Secondary | ICD-10-CM

## 2018-04-27 NOTE — Telephone Encounter (Signed)
VM left to offer to schedule visit with Palliative Care 

## 2018-04-27 NOTE — Telephone Encounter (Signed)
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  Would like to know how to proceed.  [phone]

## 2018-04-27 NOTE — Telephone Encounter (Signed)
I called patient but did not reach him.  I called Caryl Pina back, and will refer patient to interventional radiology for consult first.  Patient is coming for chemo this Thursday, will discuss and confirm with him.  Truitt Merle MD

## 2018-04-29 ENCOUNTER — Telehealth: Payer: Self-pay

## 2018-04-29 ENCOUNTER — Other Ambulatory Visit: Payer: Self-pay | Admitting: Hematology

## 2018-04-29 ENCOUNTER — Telehealth: Payer: Self-pay | Admitting: *Deleted

## 2018-04-29 DIAGNOSIS — C251 Malignant neoplasm of body of pancreas: Secondary | ICD-10-CM

## 2018-04-29 MED ORDER — OXYCODONE HCL 5 MG PO TABS: 5.0000 mg | ORAL_TABLET | Freq: Four times a day (QID) | ORAL | 0 refills | Status: DC | PRN

## 2018-04-29 NOTE — Telephone Encounter (Signed)
I called back, and answered her questions. Oxycodone refilled today.   Truitt Merle MD

## 2018-04-29 NOTE — Telephone Encounter (Signed)
Received vm call from pt's daughter, Caryl Pina requesting information on support groups.  Returned call & left vm stating that there was not a  pancreatic support group but there is a general cancer support group that meets 4th Thurs of the month from 4-5:30 pm & left # [phone] to register & Geraldine Contras # [phone] for further info.  Informed to check with [place] & to talk with Dr Ernestina Penna RN.

## 2018-04-29 NOTE — Progress Notes (Unsigned)
[place]

## 2018-04-29 NOTE — Telephone Encounter (Signed)
Patient's daughter [name] calls wanting the name of the person or department that will be doing the celiac plexus block and their phone number.  Also patient needs a refill on Oxycodone sent into [facility].

## 2018-04-30 ENCOUNTER — Inpatient Hospital Stay: Payer: Medicare Other

## 2018-04-30 ENCOUNTER — Inpatient Hospital Stay: Payer: Medicare Other | Attending: Hematology

## 2018-04-30 ENCOUNTER — Inpatient Hospital Stay: Payer: Medicare Other | Admitting: Nutrition

## 2018-04-30 ENCOUNTER — Inpatient Hospital Stay (HOSPITAL_BASED_OUTPATIENT_CLINIC_OR_DEPARTMENT_OTHER): Payer: Medicare Other | Admitting: Medical

## 2018-04-30 VITALS — BP 101/62 | HR 92 | Temp 98.3°F | Resp 18 | Ht 71.0 in | Wt 115.5 lb

## 2018-04-30 DIAGNOSIS — K769 Liver disease, unspecified: Secondary | ICD-10-CM | POA: Insufficient documentation

## 2018-04-30 DIAGNOSIS — K5903 Drug induced constipation: Secondary | ICD-10-CM | POA: Insufficient documentation

## 2018-04-30 DIAGNOSIS — K649 Unspecified hemorrhoids: Secondary | ICD-10-CM | POA: Diagnosis not present

## 2018-04-30 DIAGNOSIS — C251 Malignant neoplasm of body of pancreas: Secondary | ICD-10-CM | POA: Insufficient documentation

## 2018-04-30 DIAGNOSIS — C61 Malignant neoplasm of prostate: Secondary | ICD-10-CM | POA: Diagnosis not present

## 2018-04-30 DIAGNOSIS — F419 Anxiety disorder, unspecified: Secondary | ICD-10-CM

## 2018-04-30 DIAGNOSIS — Z87891 Personal history of nicotine dependence: Secondary | ICD-10-CM | POA: Diagnosis not present

## 2018-04-30 DIAGNOSIS — I10 Essential (primary) hypertension: Secondary | ICD-10-CM | POA: Diagnosis not present

## 2018-04-30 DIAGNOSIS — C7800 Secondary malignant neoplasm of unspecified lung: Secondary | ICD-10-CM

## 2018-04-30 DIAGNOSIS — E119 Type 2 diabetes mellitus without complications: Secondary | ICD-10-CM | POA: Insufficient documentation

## 2018-04-30 DIAGNOSIS — Z5111 Encounter for antineoplastic chemotherapy: Secondary | ICD-10-CM | POA: Diagnosis not present

## 2018-04-30 DIAGNOSIS — R918 Other nonspecific abnormal finding of lung field: Secondary | ICD-10-CM | POA: Diagnosis not present

## 2018-04-30 DIAGNOSIS — Z79899 Other long term (current) drug therapy: Secondary | ICD-10-CM | POA: Insufficient documentation

## 2018-04-30 DIAGNOSIS — Z5189 Encounter for other specified aftercare: Secondary | ICD-10-CM | POA: Insufficient documentation

## 2018-04-30 DIAGNOSIS — Z95828 Presence of other vascular implants and grafts: Secondary | ICD-10-CM

## 2018-04-30 DIAGNOSIS — C787 Secondary malignant neoplasm of liver and intrahepatic bile duct: Secondary | ICD-10-CM

## 2018-04-30 DIAGNOSIS — I2510 Atherosclerotic heart disease of native coronary artery without angina pectoris: Secondary | ICD-10-CM | POA: Diagnosis not present

## 2018-04-30 LAB — CBC WITH DIFFERENTIAL (CANCER CENTER ONLY)
Abs Immature Granulocytes: 0.02 10*3/uL (ref 0.00–0.07)
Basophils Absolute: 0 10*3/uL (ref 0.0–0.1)
Basophils Relative: 0 %
Eosinophils Absolute: 0.1 10*3/uL (ref 0.0–0.5)
Eosinophils Relative: 1 %
HCT: 32.8 % — ABNORMAL LOW (ref 39.0–52.0)
Hemoglobin: 10.8 g/dL — ABNORMAL LOW (ref 13.0–17.0)
Immature Granulocytes: 0 %
LYMPHS ABS: 1.1 10*3/uL (ref 0.7–4.0)
Lymphocytes Relative: 16 %
MCH: 32 pg (ref 26.0–34.0)
MCHC: 32.9 g/dL (ref 30.0–36.0)
MCV: 97 fL (ref 80.0–100.0)
MONOS PCT: 10 %
Monocytes Absolute: 0.7 10*3/uL (ref 0.1–1.0)
Neutro Abs: 5 10*3/uL (ref 1.7–7.7)
Neutrophils Relative %: 73 %
Platelet Count: 254 10*3/uL (ref 150–400)
RBC: 3.38 MIL/uL — ABNORMAL LOW (ref 4.22–5.81)
RDW: 13.4 % (ref 11.5–15.5)
WBC: 6.8 10*3/uL (ref 4.0–10.5)
nRBC: 0 % (ref 0.0–0.2)

## 2018-04-30 LAB — CMP (CANCER CENTER ONLY)
ALT: 17 U/L (ref 0–44)
AST: 17 U/L (ref 15–41)
Albumin: 3.9 g/dL (ref 3.5–5.0)
Alkaline Phosphatase: 79 U/L (ref 38–126)
Anion gap: 11 (ref 5–15)
BUN: 13 mg/dL (ref 8–23)
CO2: 26 mmol/L (ref 22–32)
Calcium: 9.4 mg/dL (ref 8.9–10.3)
Chloride: 100 mmol/L (ref 98–111)
Creatinine: 0.78 mg/dL (ref 0.61–1.24)
GFR, Est AFR Am: >60 mL/min (ref >60)
GFR, Estimated: >60 mL/min (ref >60)
Glucose, Bld: 164 mg/dL — ABNORMAL HIGH (ref 70–99)
Potassium: 4.9 mmol/L (ref 3.5–5.1)
Sodium: 137 mmol/L (ref 135–145)
Total Bilirubin: 0.5 mg/dL (ref 0.3–1.2)
Total Protein: 6.9 g/dL (ref 6.5–8.1)

## 2018-04-30 MED ORDER — SODIUM CHLORIDE 0.9 % IV SOLN
400.0000 mg/m2 | Freq: Once | INTRAVENOUS | Status: AC
  Administered 2018-04-30: 676 mg via INTRAVENOUS
  Filled 2018-04-30: qty 33.8

## 2018-04-30 MED ORDER — DEXTROSE 5 % IV SOLN
Freq: Once | INTRAVENOUS | Status: AC
  Administered 2018-04-30: 11:00:00 via INTRAVENOUS
  Filled 2018-04-30: qty 250

## 2018-04-30 MED ORDER — DEXAMETHASONE SODIUM PHOSPHATE 10 MG/ML IJ SOLN
INTRAMUSCULAR | Status: AC
  Filled 2018-04-30: qty 1

## 2018-04-30 MED ORDER — HYDROCORTISONE ACE-PRAMOXINE 1-1 % RE FOAM: 1.0000 | Freq: Two times a day (BID) | RECTAL | 2 refills | Status: DC

## 2018-04-30 MED ORDER — LORAZEPAM 1 MG PO TABS
ORAL_TABLET | ORAL | Status: AC
  Filled 2018-04-30: qty 1

## 2018-04-30 MED ORDER — LORAZEPAM 1 MG PO TABS
1.0000 mg | ORAL_TABLET | Freq: Once | ORAL | Status: AC
  Administered 2018-04-30: 1 mg via ORAL

## 2018-04-30 MED ORDER — SODIUM CHLORIDE 0.9% FLUSH
10.0000 mL | Freq: Once | INTRAVENOUS | Status: AC
  Administered 2018-04-30: 10 mL
  Filled 2018-04-30: qty 10

## 2018-04-30 MED ORDER — DEXTROSE 5 % IV SOLN
INTRAVENOUS | Status: DC
  Administered 2018-04-30: 15:00:00 via INTRAVENOUS
  Filled 2018-04-30: qty 250

## 2018-04-30 MED ORDER — SODIUM CHLORIDE 0.9 % IV SOLN
2400.0000 mg/m2 | INTRAVENOUS | Status: DC
  Administered 2018-04-30: 4050 mg via INTRAVENOUS
  Filled 2018-04-30: qty 81

## 2018-04-30 MED ORDER — OXALIPLATIN CHEMO INJECTION 100 MG/20ML
70.0000 mg/m2 | Freq: Once | INTRAVENOUS | Status: AC
  Administered 2018-04-30: 120 mg via INTRAVENOUS
  Filled 2018-04-30: qty 20

## 2018-04-30 MED ORDER — PALONOSETRON HCL INJECTION 0.25 MG/5ML
0.2500 mg | Freq: Once | INTRAVENOUS | Status: AC
  Administered 2018-04-30: 0.25 mg via INTRAVENOUS

## 2018-04-30 MED ORDER — ATROPINE SULFATE 1 MG/ML IJ SOLN
0.5000 mg | Freq: Once | INTRAMUSCULAR | Status: AC | PRN
  Administered 2018-04-30: 0.5 mg via INTRAVENOUS

## 2018-04-30 MED ORDER — DEXAMETHASONE SODIUM PHOSPHATE 10 MG/ML IJ SOLN
10.0000 mg | Freq: Once | INTRAMUSCULAR | Status: AC
  Administered 2018-04-30: 10 mg via INTRAVENOUS

## 2018-04-30 MED ORDER — SODIUM CHLORIDE 0.9 % IV SOLN
130.0000 mg/m2 | Freq: Once | INTRAVENOUS | Status: AC
  Administered 2018-04-30: 220 mg via INTRAVENOUS
  Filled 2018-04-30: qty 11

## 2018-04-30 MED ORDER — ATROPINE SULFATE 1 MG/ML IJ SOLN
INTRAMUSCULAR | Status: AC
  Filled 2018-04-30: qty 1

## 2018-04-30 MED ORDER — PALONOSETRON HCL INJECTION 0.25 MG/5ML
INTRAVENOUS | Status: AC
  Filled 2018-04-30: qty 5

## 2018-04-30 NOTE — Progress Notes (Signed)
Ok to treat today per PA Lucianne Lei with labs/VS from 1/9.

## 2018-04-30 NOTE — Patient Instructions (Signed)
[facility] Discharge Instructions for Patients Receiving Chemotherapy  Today you received the following chemotherapy agents Oxaliplatin, Irinotecan, Leucovorin and Adrucil   To help prevent nausea and vomiting after your treatment, we encourage you to take your nausea medication as directed.    If you develop nausea and vomiting that is not controlled by your nausea medication, call the clinic.   BELOW ARE SYMPTOMS THAT SHOULD BE REPORTED IMMEDIATELY:  *FEVER GREATER THAN 100.5 F  *CHILLS WITH OR WITHOUT FEVER  NAUSEA AND VOMITING THAT IS NOT CONTROLLED WITH YOUR NAUSEA MEDICATION  *UNUSUAL SHORTNESS OF BREATH  *UNUSUAL BRUISING OR BLEEDING  TENDERNESS IN MOUTH AND THROAT WITH OR WITHOUT PRESENCE OF ULCERS  *URINARY PROBLEMS  *BOWEL PROBLEMS  UNUSUAL RASH Items with * indicate a potential emergency and should be followed up as soon as possible.  Feel free to call the clinic should you have any questions or concerns. The clinic phone number is [phone].  Please show the CHEMO ALERT CARD at check-in to the Emergency Department and triage nurse.   

## 2018-04-30 NOTE — Patient Instructions (Signed)
Constipation Management  Magnesium Citrate, drink 1/2 bottle, drink remainder if no bowel movement with 30 to 60 minutes  Or  30 mg (1 tablespoon) of Milk of Magnesia in 8 ounces of prune juice, warm in microwave for 20 seconds    Begin the following after you have had a bowel movement:  Senna-S, 1 to 2 tablets twice daily  MiraLAX 17 grams in 8 ounces of liquids 1 to 2 times daily as needed   Remember to remain well hydrated. Drink, Drink, Drink non-caffeinated beverages.   Adjust these medications based on your response. If your bowel movements become too loose then decrease the amount of Senna-S and/or MiraLAX that you are using. If your bowel movements become too firm or are difficult to pass, the increase the amount of Senna-S and/or MiraLAX that you are using and increase your intake of water.  

## 2018-04-30 NOTE — Progress Notes (Signed)
Brief nutrition follow-up completed with patient during infusion for pancreas cancer. Weight is stable at 115.5 pounds January 9. Patient reports increased pain, especially with oral intake. States he is trying to drink Glucerna and is agreeable to additional coupons today.  Nutrition diagnosis: Malnutrition continues.  Intervention: I provided support and encouragement for patient to try to eat high-calorie high-protein foods when tolerated. Encouraged Glucerna between meals and provided coupons.  Monitoring, evaluation, goals: Patient will tolerate adequate calories and protein to minimize weight loss.  Next visit: To be scheduled as needed.  **Disclaimer: This note was dictated with voice recognition software. Similar sounding words can inadvertently be transcribed and this note may contain transcription errors which may not have been corrected upon publication of note.**

## 2018-05-01 ENCOUNTER — Other Ambulatory Visit: Payer: Self-pay | Admitting: Hematology

## 2018-05-01 DIAGNOSIS — C251 Malignant neoplasm of body of pancreas: Secondary | ICD-10-CM

## 2018-05-01 NOTE — Progress Notes (Signed)
[facility] Progress Note   Michael Maddox [id number] 1950-06-24 68 y.o.  Michael Maddox is managed by Dr. Burr Medico  Actively treated with chemotherapy/immunotherapy/hormonal therapy: yes  Current Therapy: FOLFIRINOX  Last Treated: 04/09/2018 (cycle 6, day 1)  Assessment: Plan:    Hemorrhoids, unspecified hemorrhoid type - Plan: DISCONTINUED: hydrocortisone-pramoxine (PROCTOFOAM HC) rectal foam  Anxiety - Plan: LORazepam (ATIVAN) tablet 1 mg  Malignant neoplasm of body of pancreas ([place]) - Plan: DISCONTINUED: dextrose 5 % solution, DISCONTINUED: atropine injection 0.5 mg, DISCONTINUED: dexamethasone (DECADRON) injection 10 mg, DISCONTINUED: palonosetron (ALOXI) injection 0.25 mg, DISCONTINUED: oxaliplatin (ELOXATIN) 120 mg in dextrose 5 % 500 mL chemo infusion, DISCONTINUED: leucovorin 676 mg in sodium chloride 0.9 % 250 mL infusion, DISCONTINUED: fluorouracil (ADRUCIL) 4,050 mg in sodium chloride 0.9 % 69 mL chemo infusion, DISCONTINUED: irinotecan (CAMPTOSAR) 220 mg in sodium chloride 0.9 % 500 mL chemo infusion  Drug-induced constipation   Hemorrhoids: The patient will continue using hydrocortisone cream as his insurance would not pay for Proctofoam:  Anxiety: The patient did not take his Ativan this morning.  He was given 1 mg today prior to his treatment.  Malignant neoplasm of the pancreas: Patient presents for cycle 7, day 1 of FOLFIRINOX.  The patient's labs were reviewed with Dr. Burr Medico who approves proceeding with his treatment today.  Michael Maddox will return to clinic for follow-up with Dr. Burr Medico and for consideration of his next cycle of chemotherapy on 05/14/2018.  Drug-induced constipation: The patient was given information on management of his constipation.  Please see the patient instructions as noted in his after visit summary.  Please see After Visit Summary for patient specific instructions.  Future Appointments  Date Time Provider [place]    05/02/2018  1:30 PM [facility] [place] FLUSH [facility] None  05/06/2018  2:30 PM [facility] IR [facility]  05/14/2018  8:45 AM [facility] LAB 1 [facility] None  05/14/2018  9:00 AM [facility] [place] FLUSH [facility] None  05/14/2018  9:30 AM Truitt Merle, MD [facility] None  05/14/2018 10:45 AM [facility] INFUSION [facility] None  05/16/2018 12:45 PM [facility] [place] FLUSH [facility] None  05/28/2018  9:30 AM [facility] LAB 2 [facility] None  05/28/2018  9:45 AM [facility] [place] FLUSH [facility] None  05/28/2018 10:30 AM Truitt Merle, MD [facility] None  05/28/2018 11:30 AM [facility] INFUSION [facility] None  05/30/2018  1:30 PM [facility] [place] FLUSH [facility] None    No orders of the defined types were placed in this encounter.      Subjective:   Patient ID:  Michael Maddox is a 68 y.o. (DOB [birth year]) male.  Chief Complaint:  Chief Complaint  Patient presents with  . Follow-up    HPI Michael Maddox is a 68 year old male with a history of a malignant neoplasm of the pancreas.  He presents to the clinic today for consideration of cycle 7, day 1 of chemotherapy.  He has a history of anxiety and did not take his Ativan this morning before leaving home.  He request that it be given prior to his treatment.  He has a history of ongoing constipation.  He has been using Colace and senna without good results.  He has a history of hemorrhoids which flare with his constipation.  His appetite is low.  He reports fatigue.  He denies fevers, chills, sweats, nausea, or vomiting.  Medications: I have reviewed the patient's current medications.  Allergies: No Known Allergies  Past Medical History:  Diagnosis Date  . Arthritis   .  Carotid artery disease ([place]) followed by dr berry--- per last duplex [id number]  LICA 63-01%, bilateral ECA >50%,  patent RICA post intervention   hx acute high grade stenosis RICA >80% 02-22-2009 in setting of  stroke  s/p  right carotid endarterectomy 02-28-2009/    . Claudication of  both lower extremities ([place])    due to PAD ----  right > left  . COPD (chronic obstructive pulmonary disease) ([place])   . Coronary artery disease CARDIOLOGIST-  DR BERRY   hx positive myoview 07-29-2013;  08-16-2013 per cardiac cath, severe 3V CAD w/ 60% subclavian stenosis;  08-23-2013   s/p  CABG x3  (LIMA to LAD, SVG to OM1, SVG to PDA)  . CVA (cerebral vascular accident) ([place]) 02/22/2009   acute infarct right hemisphere w/ severe RICA stentosis----  per pt no residuals  . Family history of brain cancer   . Family history of breast cancer   . GERD (gastroesophageal reflux disease)   . HTN (hypertension)   . Hyperlipidemia   . Hyperplasia of prostate with lower urinary tract symptoms (LUTS)   . Peripheral arterial disease ([place]) followed by dr berry--- last dobbler ABIs bilateral SFA occlusions (pt is scheduled for intervention 05-08-2017   hx PTA and stenting to bilateral SFA 2002 and PTA w/ stenting left common iliac artery and for in-stent restenosis 2003 Left SFA and 2005  stenting right SFA for in-stent restenosis  . Prostate cancer [facility]) urologist-  dr ottelin/  oncologist-  dr Tammi Klippel   dx 11-20-2016-- Stage T1c, Gleason 4+3, PSA 5.73, vol 35.95cc--- scheduled for radioactive seed implants 04-11-2017  . 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 S/P peripheral artery angioplasty with stent placement    bilateral SFA 2002;  in-stenosis bilateral SFA  (right 2003, left 2005) and left CIA 2003  . Shortness of breath   . Type II diabetes mellitus ([place])   . Weak urinary stream   . Wears glasses     Past Surgical History:  Procedure Laterality Date  . ABDOMINAL AORTOGRAM N/A 05/08/2017   Procedure: ABDOMINAL AORTOGRAM;  Surgeon: Lorretta Harp, MD;  Location: [place] [facility];  Service: Cardiovascular;  Laterality: N/A;  . CARDIAC CATHETERIZATION  12-01-2000    dr berry    non-critial CAD & nonischemic cardiolite-- mLAD 50%, mLCx 70%, prox. to mid RCA 75%  focal midsegment 80-85%  . CARDIOVASCULAR STRESS TEST  07-29-2013    dr berry   High risk nuclear study w/ inferoseptal ischemia/  normal LV function and wall motion,  nuclear study ef 52%  . CAROTID ENDARTERECTOMY Right 02-28-2009    dr c. Scot Dock  [facility]   patch angioplasty  . COLONOSCOPY    . CORONARY ARTERY BYPASS GRAFT N/A 08/23/2013   Procedure: CORONARY ARTERY BYPASS GRAFTING (CABG);  Surgeon: Melrose Nakayama, MD;  Location: [place];  Service: Open Heart Surgery;  Laterality: N/A;  Coronary Artery Bypass graft times three using left internal mammary artery and left leg saphenous vein   . CYSTOSCOPY N/A 04/11/2017   Procedure: CYSTOSCOPY FLEXIBLE;  Surgeon: Kathie Rhodes, MD;  Location[facility];  Service: Urology;  Laterality: N/A;  no seeds found in bladder  . ENDOVASCULAR STENT INSERTION Bilateral right 12-25-2000 ;  left 03-02-2001   dr berry   PTA and stenting to bilateral SFA  . ENDOVASCULAR STENT INSERTION Bilateral right 08-30-2003 ;  left 11-10-2001    dr berry  PTA and stenting for in-stent restenosis right SFA 08-30-2003:  PTA and stenting distal left common iliac artery and in-stent restenosis left SFA 11-10-2001  . FINE NEEDLE ASPIRATION N/A 12/19/2017   Procedure: FINE NEEDLE ASPIRATION (FNA) LINEAR;  Surgeon: Carol Ada, MD;  Location: [facility];  Service: Endoscopy;  Laterality: N/A;  . INTRAOPERATIVE TRANSESOPHAGEAL ECHOCARDIOGRAM N/A 08/23/2013   Procedure: INTRAOPERATIVE TRANSESOPHAGEAL ECHOCARDIOGRAM;  Surgeon: Melrose Nakayama, MD;  Location: [place];  Service: Open Heart Surgery;  Laterality: N/A;  . IR IMAGING GUIDED PORT INSERTION  01/20/2018  . LEFT HEART CATHETERIZATION WITH CORONARY ANGIOGRAM N/A 08/16/2013   Procedure: LEFT HEART CATHETERIZATION WITH CORONARY ANGIOGRAM;  Surgeon: Lorretta Harp, MD;  Location: [facility] CATH LAB;  Service: Cardiovascular;  Laterality: N/A;   severe 3V CAD and 60% subclavian sternosis  . LOWER EXTREMITY ANGIOGRAM  N/A 08/16/2013   Procedure: LOWER EXTREMITY ANGIOGRAM;  Surgeon: Lorretta Harp, MD;  Location: [facility] CATH LAB;  Service: Cardiovascular;  Laterality: N/A;  . LOWER EXTREMITY ANGIOGRAPHY Bilateral 05/08/2017   Procedure: Lower Extremity Angiography;  Surgeon: Lorretta Harp, MD;  Location: [place] [facility];  Service: Cardiovascular;  Laterality: Bilateral;  . PERIPHERAL VASCULAR BALLOON ANGIOPLASTY Left 05/08/2017   Procedure: PERIPHERAL VASCULAR BALLOON ANGIOPLASTY;  Surgeon: Lorretta Harp, MD;  Location: [place] [facility];  Service: Cardiovascular;  Laterality: Left;  COMMON ILIAC  . RADIOACTIVE SEED IMPLANT N/A 04/11/2017   Procedure: RADIOACTIVE SEED IMPLANT/BRACHYTHERAPY IMPLANT;  Surgeon: Kathie Rhodes, MD;  Location: [facility];  Service: Urology;  Laterality: N/A;    67   seeds implanted  . SPACE OAR INSTILLATION N/A 04/11/2017   Procedure: SPACE OAR INSTILLATION;  Surgeon: Kathie Rhodes, MD;  Location[facility];  Service: Urology;  Laterality: N/A;  . TRANSTHORACIC ECHOCARDIOGRAM  07/15/2013   ef 55-97%, grade 1 diastolic dysfunction/  trivial MR, mild to moderate PR, mild TR   . UPPER ESOPHAGEAL ENDOSCOPIC ULTRASOUND (EUS) N/A 12/19/2017   Procedure: UPPER ESOPHAGEAL ENDOSCOPIC ULTRASOUND (EUS);  Surgeon: Carol Ada, MD;  Location: [facility];  Service: Endoscopy;  Laterality: N/A;    Family History  Problem Relation Age of Onset  . Diabetes Mother   . Breast cancer Mother 51  . Heart disease Father   . Brain cancer Maternal Grandfather     Social History   Socioeconomic History  . Marital status: Married    Spouse name: Not on file  . Number of children: 3  . Years of education: Not on file  . Highest education level: Not on file  Occupational History  . Not on file  Social Needs  . Financial resource strain: Not on file  . Food insecurity:    Worry: Not on file    Inability: Not on file  . Transportation needs:     Medical: Not on file    Non-medical: Not on file  Tobacco Use  . Smoking status: Former Smoker    Packs/day: 1.00    Years: 35.00    Pack years: 35.00    Types: Cigarettes    Last attempt to quit: 05/04/2013    Years since quitting: 4.9  . Smokeless tobacco: Never Used  Substance and Sexual Activity  . Alcohol use: Yes    Alcohol/week: 4.0 standard drinks    Types: 4 Standard drinks or equivalent per week    Comment: none in 2 months, previously 1-2 drinks per week   . Drug use: No  . Sexual activity: Not  on file  Lifestyle  . Physical activity:    Days per week: Not on file    Minutes per session: Not on file  . Stress: Not on file  Relationships  . Social connections:    Talks on phone: Not on file    Gets together: Not on file    Attends religious service: Not on file    Active member of club or organization: Not on file    Attends meetings of clubs or organizations: Not on file    Relationship status: Not on file  . Intimate partner violence:    Fear of current or ex partner: Not on file    Emotionally abused: Not on file    Physically abused: Not on file    Forced sexual activity: Not on file  Other Topics Concern  . Not on file  Social History Narrative  . Not on file    Past Medical History, Surgical history, Social history, and Family history were reviewed and updated as appropriate.   Please see review of systems for further details on the patient's review from today.   Review of Systems:  Review of Systems  Constitutional: Positive for appetite change and fatigue. Negative for chills, diaphoresis, fever and unexpected weight change.  HENT: Negative for congestion, postnasal drip, rhinorrhea and sore throat.   Respiratory: Negative for cough, shortness of breath and wheezing.   Cardiovascular: Negative for palpitations.  Gastrointestinal: Positive for constipation and rectal pain (Secondary to hemorrhoids.). Negative for abdominal distention, abdominal  pain, anal bleeding, blood in stool, diarrhea, nausea and vomiting.  Neurological: Negative for headaches.    Objective:   Physical Exam:  BP 101/62 (BP Location: Left Arm, Patient Position: Sitting)   Pulse 92   Temp 98.3 F (36.8 C) (Oral)   Resp 18   Ht 5\' 11"  (1.803 m)   Wt 115 lb 8 oz (52.4 kg)   SpO2 100%   BMI 16.11 kg/m  ECOG: 1  Physical Exam Constitutional:      General: He is not in acute distress.    Appearance: He is not ill-appearing or diaphoretic.     Comments: The patient is an adult male who appears older than his stated age but who is in no acute distress.  HENT:     Head: Normocephalic and atraumatic.  Cardiovascular:     Rate and Rhythm: Normal rate and regular rhythm.     Heart sounds: Normal heart sounds. No murmur. No friction rub. No gallop.   Pulmonary:     Effort: Pulmonary effort is normal. No respiratory distress.     Breath sounds: Normal breath sounds. No wheezing or rales.  Skin:    General: Skin is warm and dry.     Findings: No erythema or rash.  Neurological:     General: No focal deficit present.     Mental Status: He is alert.     Gait: Gait normal.  Psychiatric:        Mood and Affect: Mood normal.        Behavior: Behavior normal.        Thought Content: Thought content normal.        Judgment: Judgment normal.     Lab Review:     Component Value Date/Time   NA 137 04/30/2018 0759   K 4.9 04/30/2018 0759   CL 100 04/30/2018 0759   CO2 26 04/30/2018 0759   GLUCOSE 164 (H) 04/30/2018 0759   BUN 13 04/30/2018  0759   CREATININE 0.78 04/30/2018 0759   CREATININE 1.03 08/10/2013 0701   CALCIUM 9.4 04/30/2018 0759   PROT 6.9 04/30/2018 0759   ALBUMIN 3.9 04/30/2018 0759   AST 17 04/30/2018 0759   ALT 17 04/30/2018 0759   ALKPHOS 79 04/30/2018 0759   BILITOT 0.5 04/30/2018 0759   GFRNONAA >60 04/30/2018 0759   GFRAA >60 04/30/2018 0759       Component Value Date/Time   WBC 6.8 04/30/2018 0759   WBC 4.7 02/21/2018  1429   RBC 3.38 (L) 04/30/2018 0759   HGB 10.8 (L) 04/30/2018 0759   HCT 32.8 (L) 04/30/2018 0759   PLT 254 04/30/2018 0759   MCV 97.0 04/30/2018 0759   MCH 32.0 04/30/2018 0759   MCHC 32.9 04/30/2018 0759   RDW 13.4 04/30/2018 0759   LYMPHSABS 1.1 04/30/2018 0759   MONOABS 0.7 04/30/2018 0759   EOSABS 0.1 04/30/2018 0759   BASOSABS 0.0 04/30/2018 0759   -------------------------------  Imaging from last 24 hours (if applicable):  Radiology interpretation: Ct Abdomen Pelvis W Contrast  Result Date: 04/06/2018 CLINICAL DATA:  Pancreatic cancer with vascular invasion. EXAM: CT ABDOMEN AND PELVIS WITH CONTRAST TECHNIQUE: Multidetector CT imaging of the abdomen and pelvis was performed using the standard protocol following bolus administration of intravenous contrast. CONTRAST:  134mL OMNIPAQUE IOHEXOL 300 MG/ML  SOLN COMPARISON:  MRI 01/05/2018 FINDINGS: Lower chest: Lung bases are clear. Hepatobiliary: On the arterial phase imaging there is no clear early enhancing lesion. Non uniform vascular enhancement is present with increased early enhancement in the central LEFT hepatic lobe around the gallbladder fossa On the more delayed venous phase imaging there is a hypo enhancing lesion in the RIGHT hepatic lobe adjacent the gallbladder fossa measuring 12 mm (image 40/7). This lesion not seen on comparison MRI. Smaller hypo enhancing lesion more superiorly in the central LEFT hepatic lobe measuring 7 mm (image 30/7) similar to comparison exam. In the dome a of the LEFT hepatic lobe centrally hypointense lesion appears similar prior MRI (image 13/7). Gallbladder normal.  No biliary duct dilatation Pancreas: Again demonstrated hypoenhancing mass involving the body pancreas and extending to involve the central arterial structures of the celiac trunk and SMA. Mass measures approximately 3.7 by 3.6 by 2.4 cm is similar to comparison exam (3.8 x 3.4 by 3.5 cm). Second rounded lesion adjacent to the uncinate  of the pancreas likely represents a necrotic node or direct tumor extension. This lesion measures 2.5 by 2.6 cm in axial dimension (image 48/7) compared with 2.1 by 2.4 cm. There is pancreatic duct dilatation proximal to obstructive lesion in the body the pancreas (image 42/7 ) similar to comparison MRI Spleen: Normal spleen Adrenals/urinary tract: Adrenal glands are thickened. Kidneys appear normal. Bladder normal Stomach/Bowel: Stomach, duodenum, small-bowel cecum normal. Colon and rectosigmoid colon normal. Vascular/Lymphatic: Abdominal aorta is normal caliber with atherosclerotic calcification. There is no retroperitoneal or periportal lymphadenopathy. No pelvic lymphadenopathy. Heavy calcification of the iliac arteries. stent in the LEFT common iliac artery. There is poor opacification of the iliac arteries particularly on the RIGHT. reconstitution of the femoral arteries. Reproductive: Brachytherapy seeds in the prostate gland Other: No free fluid. Musculoskeletal: No aggressive osseous lesion. IMPRESSION: 1. No significant change in large pancreatic body mass with pancreatic ductal obstruction. No biliary obstruction 2. No change in size of peripancreatic mass adjacent to uncinate consistent with nodal metastasis or direct tumor extension. 3. Vascular involvement of the celiac trunk and SMA by the pancreatic cancer similar to prior.  4. Hypodense lesion along the gallbladder fossa not seen on comparison MRI is indeterminate. Consider follow-up MRI for further characterization. Two other hypodense lesions in the liver appear similar to comparison exam MRI. 5. Obstruction of the iliac arteries by atherosclerotic disease. Reconstitution of the femoral arteries by collateral flow. Electronically Signed   By: Suzy Bouchard M.D.   On: 04/06/2018 15:01        This case was discussed with Dr. Burr Medico. She expressed agreement with my management of this patient.

## 2018-05-02 ENCOUNTER — Inpatient Hospital Stay: Payer: Medicare Other

## 2018-05-02 VITALS — BP 99/49 | HR 106 | Temp 98.6°F | Resp 18

## 2018-05-02 DIAGNOSIS — Z5111 Encounter for antineoplastic chemotherapy: Secondary | ICD-10-CM | POA: Diagnosis not present

## 2018-05-02 DIAGNOSIS — Z79899 Other long term (current) drug therapy: Secondary | ICD-10-CM | POA: Diagnosis not present

## 2018-05-02 DIAGNOSIS — C61 Malignant neoplasm of prostate: Secondary | ICD-10-CM | POA: Diagnosis not present

## 2018-05-02 DIAGNOSIS — C251 Malignant neoplasm of body of pancreas: Secondary | ICD-10-CM | POA: Diagnosis not present

## 2018-05-02 DIAGNOSIS — K769 Liver disease, unspecified: Secondary | ICD-10-CM | POA: Diagnosis not present

## 2018-05-02 DIAGNOSIS — K5903 Drug induced constipation: Secondary | ICD-10-CM | POA: Diagnosis not present

## 2018-05-02 DIAGNOSIS — I10 Essential (primary) hypertension: Secondary | ICD-10-CM | POA: Diagnosis not present

## 2018-05-02 DIAGNOSIS — E119 Type 2 diabetes mellitus without complications: Secondary | ICD-10-CM | POA: Diagnosis not present

## 2018-05-02 DIAGNOSIS — K649 Unspecified hemorrhoids: Secondary | ICD-10-CM | POA: Diagnosis not present

## 2018-05-02 DIAGNOSIS — Z5189 Encounter for other specified aftercare: Secondary | ICD-10-CM | POA: Diagnosis not present

## 2018-05-02 DIAGNOSIS — I2510 Atherosclerotic heart disease of native coronary artery without angina pectoris: Secondary | ICD-10-CM | POA: Diagnosis not present

## 2018-05-02 DIAGNOSIS — Z87891 Personal history of nicotine dependence: Secondary | ICD-10-CM | POA: Diagnosis not present

## 2018-05-02 DIAGNOSIS — R918 Other nonspecific abnormal finding of lung field: Secondary | ICD-10-CM | POA: Diagnosis not present

## 2018-05-02 MED ORDER — PEGFILGRASTIM-CBQV 6 MG/0.6ML ~~LOC~~ SOSY
6.0000 mg | PREFILLED_SYRINGE | Freq: Once | SUBCUTANEOUS | Status: AC
  Administered 2018-05-02: 6 mg via SUBCUTANEOUS

## 2018-05-02 MED ORDER — HEPARIN SOD (PORK) LOCK FLUSH 100 UNIT/ML IV SOLN
500.0000 [IU] | Freq: Once | INTRAVENOUS | Status: AC | PRN
  Administered 2018-05-02: 500 [IU]
  Filled 2018-05-02: qty 5

## 2018-05-02 MED ORDER — PEGFILGRASTIM-CBQV 6 MG/0.6ML ~~LOC~~ SOSY
PREFILLED_SYRINGE | SUBCUTANEOUS | Status: AC
  Filled 2018-05-02: qty 0.6

## 2018-05-02 MED ORDER — SODIUM CHLORIDE 0.9% FLUSH
10.0000 mL | INTRAVENOUS | Status: DC | PRN
  Administered 2018-05-02: 10 mL
  Filled 2018-05-02: qty 10

## 2018-05-02 NOTE — Patient Instructions (Signed)
Pegfilgrastim injection  What is this medicine?  PEGFILGRASTIM (PEG fil gra stim) is a long-acting granulocyte colony-stimulating factor that stimulates the growth of neutrophils, a type of white blood cell important in the body's fight against infection. It is used to reduce the incidence of fever and infection in patients with certain types of cancer who are receiving chemotherapy that affects the bone marrow, and to increase survival after being exposed to high doses of radiation.  This medicine may be used for other purposes; ask your health care provider or pharmacist if you have questions.  COMMON BRAND NAME(S): Fulphila, Neulasta, UDENYCA  What should I tell my health care provider before I take this medicine?  They need to know if you have any of these conditions:  -kidney disease  -latex allergy  -ongoing radiation therapy  -sickle cell disease  -skin reactions to acrylic adhesives (On-Body Injector only)  -an unusual or allergic reaction to pegfilgrastim, filgrastim, other medicines, foods, dyes, or preservatives  -pregnant or trying to get pregnant  -breast-feeding  How should I use this medicine?  This medicine is for injection under the skin. If you get this medicine at home, you will be taught how to prepare and give the pre-filled syringe or how to use the On-body Injector. Refer to the patient Instructions for Use for detailed instructions. Use exactly as directed. Tell your healthcare provider immediately if you suspect that the On-body Injector may not have performed as intended or if you suspect the use of the On-body Injector resulted in a missed or partial dose.  It is important that you put your used needles and syringes in a special sharps container. Do not put them in a trash can. If you do not have a sharps container, call your pharmacist or healthcare provider to get one.  Talk to your pediatrician regarding the use of this medicine in children. While this drug may be prescribed for  selected conditions, precautions do apply.  Overdosage: If you think you have taken too much of this medicine contact a poison control center or emergency room at once.  NOTE: This medicine is only for you. Do not share this medicine with others.  What if I miss a dose?  It is important not to miss your dose. Call your doctor or health care professional if you miss your dose. If you miss a dose due to an On-body Injector failure or leakage, a new dose should be administered as soon as possible using a single prefilled syringe for manual use.  What may interact with this medicine?  Interactions have not been studied.  Give your health care provider a list of all the medicines, herbs, non-prescription drugs, or dietary supplements you use. Also tell them if you smoke, drink alcohol, or use illegal drugs. Some items may interact with your medicine.  This list may not describe all possible interactions. Give your health care provider a list of all the medicines, herbs, non-prescription drugs, or dietary supplements you use. Also tell them if you smoke, drink alcohol, or use illegal drugs. Some items may interact with your medicine.  What should I watch for while using this medicine?  You may need blood work done while you are taking this medicine.  If you are going to need a MRI, CT scan, or other procedure, tell your doctor that you are using this medicine (On-Body Injector only).  What side effects may I notice from receiving this medicine?  Side effects that you should report to   your doctor or health care professional as soon as possible:  -allergic reactions like skin rash, itching or hives, swelling of the face, lips, or tongue  -back pain  -dizziness  -fever  -pain, redness, or irritation at site where injected  -pinpoint red spots on the skin  -red or dark-brown urine  -shortness of breath or breathing problems  -stomach or side pain, or pain at the shoulder  -swelling  -tiredness  -trouble passing urine or  change in the amount of urine  Side effects that usually do not require medical attention (report to your doctor or health care professional if they continue or are bothersome):  -bone pain  -muscle pain  This list may not describe all possible side effects. Call your doctor for medical advice about side effects. You may report side effects to FDA at 1-800-FDA-1088.  Where should I keep my medicine?  Keep out of the reach of children.  If you are using this medicine at home, you will be instructed on how to store it. Throw away any unused medicine after the expiration date on the label.  NOTE: This sheet is a summary. It may not cover all possible information. If you have questions about this medicine, talk to your doctor, pharmacist, or health care provider.   2019 [employer]vier/Gold Standard (2017-07-14 16:57:08)

## 2018-05-06 ENCOUNTER — Encounter: Payer: Self-pay | Admitting: Radiology

## 2018-05-06 ENCOUNTER — Other Ambulatory Visit: Payer: Self-pay | Admitting: Interventional Radiology

## 2018-05-06 ENCOUNTER — Ambulatory Visit
Admission: RE | Admit: 2018-05-06 | Discharge: 2018-05-06 | Disposition: A | Discharge status: Home / Self Care | Payer: Medicare Other | Source: Ambulatory Visit | Attending: Hematology | Admitting: Hematology

## 2018-05-06 DIAGNOSIS — C251 Malignant neoplasm of body of pancreas: Secondary | ICD-10-CM | POA: Diagnosis not present

## 2018-05-06 DIAGNOSIS — C259 Malignant neoplasm of pancreas, unspecified: Secondary | ICD-10-CM

## 2018-05-06 DIAGNOSIS — G893 Neoplasm related pain (acute) (chronic): Secondary | ICD-10-CM | POA: Diagnosis not present

## 2018-05-06 HISTORY — PX: IR RADIOLOGIST EVAL & MGMT: IMG5224

## 2018-05-06 NOTE — Progress Notes (Signed)
[place]   [phone] [phone]   Clinic Follow up Note   Patient Care Team: Nolene Ebbs, MD as PCP - General (Internal Medicine) Lorretta Harp, MD as PCP - Cardiology (Cardiology) Carol Ada, MD as Consulting Physician (Gastroenterology) Truitt Merle, MD as Consulting Physician (Hematology) Estevan Oaks, NP as Nurse Practitioner (Hospice and Palliative Medicine) 05/14/2018  CHIEF COMPLAINT: F/u on unresectable pancreatic caner   SUMMARY OF ONCOLOGIC HISTORY: Oncology History   Cancer Staging Malignant neoplasm of body of pancreas [facility]) Staging form: Exocrine Pancreas, AJCC 8th Edition - Clinical stage from 12/19/2017: Stage IIA (cT3, cN0, cM0) - Signed by Truitt Merle, MD on 01/13/2018       Malignant neoplasm of body of pancreas ([place])   12/11/2017 Imaging    CT AP WITH CONTRAST IMPRESSION: Focal area of abnormal low attenuation in the pancreatic body measuring 3.7 x 2.9 cm with cut off of the duct most concerning for primary pancreatic neoplasm.  2 small low-attenuation foci identified within the liver which are nonspecific however could represent metastatic disease; one is 1.0 cm complex lesion within the hepatic dome, the other area is a low-attenuation focus adjacent to the falciform ligament likely representing a perfusion anomaly or possible local fatty infiltration  Extensive atherosclerotic changes in the abdominal aorta, iliac vessels, splenic vessels.  Occlusion of the right and possibly left iliac.  Suspected chronic re-constitution of flow identified within the femoral vessels bilaterally.  Prostate brachytherapy consistent with history of malignancy  No evidence of small bowel obstruction.  Findings suggestive of constipation     12/19/2017 Procedure    EUS biopsy: In the body of the pancreas there was a large 20 x 30 mm, approximately, hypoechoic mass. It had irregular margin, but it was round/oval in appearance. There  was pancreatic tail atrophy, but the PD was dilated up to 7.6 mm. The celiac axis was clearly viewed and there was no evidence of invasion. In the head of the pancreas a 1 cm lymph node was identified and the pancreas was lobular in the head and uncinate process. The left adrenal was clearly identified and it was enlarged, but there was no evidence of any masses.    12/19/2017 Initial Biopsy    Diagnosis FINE NEEDLE ASPIRATION,ENDOSCOPIC, PANCREAS BODY (SPECIMEN 1 OF 1 COLLECTED 12/19/17): MALIGNANT CELLS CONSISTENT WITH ADENOCARCINOMA.    12/19/2017 Initial Diagnosis    Malignant neoplasm of body of pancreas ([place])    12/19/2017 Cancer Staging    Staging form: Exocrine Pancreas, AJCC 8th Edition - Clinical stage from 12/19/2017: Stage IIA (cT3, cN0, cM0) - Signed by Truitt Merle, MD on 01/13/2018    01/05/2018 Imaging    01/05/2018 MRI Abdomen IMPRESSION: 1. Poorly marginated hypoenhancing 4.2 x 2.5 x 5.2 cm pancreatic body mass with pancreatic tail atrophy and duct dilation, compatible with known pancreatic adenocarcinoma. Pancreatic tumor appears locally advanced with significant vascular involvement as detailed. No biliary ductal dilatation. 2. No abdominal adenopathy. 3. Two similar subcentimeter enhancing left liver lobe masses as detailed, which are too small to accurately characterize on this mildly motion degraded scan. Differential includes small metastases or hemangiomas. Suggest follow-up MRI abdomen without and with IV contrast in 3 months. These lesions are probably too small to characterize by PET-CT. 4.  Aortic Atherosclerosis (ICD10-I70.0).    01/06/2018 Imaging    01/06/2018 CT Chest IMPRESSION: 1. Two non-specific small left lower lobe pulmonary nodules. Otherwise, no evidence of metastatic disease within the chest. 2.  Aortic  Atherosclerosis (ICD10-I70.0).  Emphysema (ICD10-J43.9).    01/13/2018 Tumor Marker    Tumor Marker Results for Michael Maddox, Michael Maddox (MRN [id number]) as  of 01/13/2018 09:17  Ref. Range 12/30/2017 14:59  CA 19-9 Latest Ref Range: 0 - 35 U/mL 1,263 (H)      01/21/2018 -  Chemotherapy    FOLFIRINOX every 2 weeks starting 01/21/18. Increased to full dose oxaliplatin on cycle 2    02/03/2018 Genetic Testing    Negative genetic testing on the [facility] Multi-Cancer Panel + Brain Cancer Panel. The Multi-Cancer Panel offered by [facility] includes sequencing and/or deletion duplication testing of the following 84 genes: AIP, ALK, APC, ATM, AXIN2,BAP1,  BARD1, BLM, BMPR1A, BRCA1, BRCA2, BRIP1, CASR, CDC73, CDH1, CDK4, CDKN1B, CDKN1C, CDKN2A (p14ARF), CDKN2A (p16INK4a), CEBPA, CHEK2, CTNNA1, DICER1, DIS3L2, EGFR (c.2369C>T, p.Thr790Met variant only), EPCAM (Deletion/duplication testing only), FH, FLCN, GATA2, GPC3, GREM1 (Promoter region deletion/duplication testing only), HOXB13 (c.251G>A, p.Gly84Glu), HRAS, KIT, MAX, MEN1, MET, MITF (c.952G>A, p.Glu318Lys variant only), MLH1, MSH2, MSH3, MSH6, MUTYH, NBN, NF1, NF2, NTHL1, PALB2, PDGFRA, PHOX2B, PMS2, POLD1, POLE, POT1, PRKAR1A, PTCH1, PTEN, RAD50, RAD51C, RAD51D, RB1, RECQL4, RET, RUNX1, SDHAF2, SDHA (sequence changes only), SDHB, SDHC, SDHD, SMAD4, SMARCA4, SMARCB1, SMARCE1, STK11, SUFU, TERC, TERT, TMEM127, TP53, TSC1, TSC2, VHL, WRN and WT1.  The CNS/Brain Cancer Panel offered by [facility] includes sequencing and/or deletion duplication testing of the following 42 genes: ALK, APC, BAP1, BARD1, CDK4, CDKN2A, DICER1, EPCAM, EZH2, GPC3, HRAS, KIF1B, MEN1, MLH1, MSH2, MSH6, NF1, NF2, PHOX2B, PMS2, POT1, PRKAR1A, PTCH2, PTEN, RB1, SMARCA4, SMARCB1, SMARCE1, SUFU, TP53, TSC1, TSC2, and VHL.   The report date is 02/03/2018.     04/06/2018 Imaging    -CT AP W Constrast 04/06/18 IMPRESSION: 1. No significant change in large pancreatic body mass with pancreatic ductal obstruction. No biliary obstruction 2. No change in size of peripancreatic mass adjacent to uncinate consistent with nodal metastasis or direct tumor  extension. 3. Vascular involvement of the celiac trunk and SMA by the pancreatic cancer similar to prior. 4. Hypodense lesion along the gallbladder fossa not seen on comparison MRI is indeterminate. Consider follow-up MRI for further characterization. Two other hypodense lesions in the liver appear similar to comparison exam MRI. 5. Obstruction of the iliac arteries by atherosclerotic disease. Reconstitution of the femoral arteries by collateral flow.     CURRENT THERAPY First lineFOLFIRINOX every 2 weeks starting 01/21/18   INTERVAL HISTORY: Michael Maddox is a 68 y.o. male who is here for follow-up. Today, he is here with his family member. He is on a wheelchair. He plans to have a celiac block next week. He takes pain medications that provide minimal short term relief. His pain can go up to 9/10 in severity. He takes only one oxycodone pill daily. He is losing weight  He has a BM every 3 days and sometimes uses laxatives for relief. He takes senna, lactulose, and miralax as needed. He denies fever. He tries to stay active at home and use a treadmill.  Pertinent positives and negatives of review of systems are listed and detailed within the above HPI.  REVIEW OF SYSTEMS:   Constitutional: Denies fevers, chills (+) weight loss Eyes: Denies blurriness of vision Ears, nose, mouth, throat, and face: Denies mucositis or sore throat Respiratory: Denies cough, dyspnea or wheezes Cardiovascular: Denies palpitation, chest discomfort or lower extremity swelling Gastrointestinal:  Denies nausea, heartburn (+) constipation  Skin: Denies abnormal skin rashes Lymphatics: Denies new lymphadenopathy or easy bruising Neurological:Denies numbness, tingling or new weaknesses  Behavioral/Psych: Mood is stable, no new changes  All other systems were reviewed with the patient and are negative.  MEDICAL HISTORY:  Past Medical History:  Diagnosis Date  . Arthritis   . Carotid artery disease ([place])  followed by dr berry--- per last duplex [id number]  LICA 92-01%, bilateral ECA >50%,  patent RICA post intervention   hx acute high grade stenosis RICA >80% 02-22-2009 in setting of  stroke  s/p  right carotid endarterectomy 02-28-2009/    . Claudication of both lower extremities ([place])    due to PAD ----  right > left  . COPD (chronic obstructive pulmonary disease) ([place])   . Coronary artery disease CARDIOLOGIST-  DR BERRY   hx positive myoview 07-29-2013;  08-16-2013 per cardiac cath, severe 3V CAD w/ 60% subclavian stenosis;  08-23-2013   s/p  CABG x3  (LIMA to LAD, SVG to OM1, SVG to PDA)  . CVA (cerebral vascular accident) ([place]) 02/22/2009   acute infarct right hemisphere w/ severe RICA stentosis----  per pt no residuals  . Family history of brain cancer   . Family history of breast cancer   . GERD (gastroesophageal reflux disease)   . HTN (hypertension)   . Hyperlipidemia   . Hyperplasia of prostate with lower urinary tract symptoms (LUTS)   . Peripheral arterial disease ([place]) followed by dr berry--- last dobbler ABIs bilateral SFA occlusions (pt is scheduled for intervention 05-08-2017   hx PTA and stenting to bilateral SFA 2002 and PTA w/ stenting left common iliac artery and for in-stent restenosis 2003 Left SFA and 2005  stenting right SFA for in-stent restenosis  . Prostate cancer [facility]) urologist-  dr ottelin/  oncologist-  dr Tammi Klippel   dx 11-20-2016-- Stage T1c, Gleason 4+3, PSA 5.73, vol 35.95cc--- scheduled for radioactive seed implants 04-11-2017  . 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 S/P peripheral artery angioplasty with stent placement    bilateral SFA 2002;  in-stenosis bilateral SFA  (right 2003, left 2005) and left CIA 2003  . Shortness of breath   . Type II diabetes mellitus ([place])   . Weak urinary stream   . Wears glasses     SURGICAL HISTORY: Past Surgical History:  Procedure Laterality Date  . ABDOMINAL AORTOGRAM N/A 05/08/2017    Procedure: ABDOMINAL AORTOGRAM;  Surgeon: Lorretta Harp, MD;  Location: [place] [facility];  Service: Cardiovascular;  Laterality: N/A;  . CARDIAC CATHETERIZATION  12-01-2000    dr berry    non-critial CAD & nonischemic cardiolite-- mLAD 50%, mLCx 70%, prox. to mid RCA 75% focal midsegment 80-85%  . CARDIOVASCULAR STRESS TEST  07-29-2013    dr berry   High risk nuclear study w/ inferoseptal ischemia/  normal LV function and wall motion,  nuclear study ef 52%  . CAROTID ENDARTERECTOMY Right 02-28-2009    dr c. Scot Dock  [facility]   patch angioplasty  . COLONOSCOPY    . CORONARY ARTERY BYPASS GRAFT N/A 08/23/2013   Procedure: CORONARY ARTERY BYPASS GRAFTING (CABG);  Surgeon: Melrose Nakayama, MD;  Location: [place];  Service: Open Heart Surgery;  Laterality: N/A;  Coronary Artery Bypass graft times three using left internal mammary artery and left leg saphenous vein   . CYSTOSCOPY N/A 04/11/2017   Procedure: CYSTOSCOPY FLEXIBLE;  Surgeon: Kathie Rhodes, MD;  Location: [facility];  Service: Urology;  Laterality: N/A;  no seeds found in bladder  . ENDOVASCULAR STENT  INSERTION Bilateral right 12-25-2000 ;  left 03-02-2001   dr berry   PTA and stenting to bilateral SFA  . ENDOVASCULAR STENT INSERTION Bilateral right 08-30-2003 ;  left 11-10-2001    dr berry   PTA and stenting for in-stent restenosis right SFA 08-30-2003:  PTA and stenting distal left common iliac artery and in-stent restenosis left SFA 11-10-2001  . FINE NEEDLE ASPIRATION N/A 12/19/2017   Procedure: FINE NEEDLE ASPIRATION (FNA) LINEAR;  Surgeon: Carol Ada, MD;  Location: [facility];  Service: Endoscopy;  Laterality: N/A;  . INTRAOPERATIVE TRANSESOPHAGEAL ECHOCARDIOGRAM N/A 08/23/2013   Procedure: INTRAOPERATIVE TRANSESOPHAGEAL ECHOCARDIOGRAM;  Surgeon: Melrose Nakayama, MD;  Location: [place];  Service: Open Heart Surgery;  Laterality: N/A;  . IR IMAGING GUIDED PORT INSERTION  01/20/2018  . IR RADIOLOGIST EVAL & MGMT   05/06/2018  . LEFT HEART CATHETERIZATION WITH CORONARY ANGIOGRAM N/A 08/16/2013   Procedure: LEFT HEART CATHETERIZATION WITH CORONARY ANGIOGRAM;  Surgeon: Lorretta Harp, MD;  Location: [facility] C[facility];  Service: Cardiovascular;  Laterality: N/A;   severe 3V CAD and 60% subclavian sternosis  . LOWER EXTREMITY ANGIOGRAM N/A 08/16/2013   Procedure: LOWER EXTREMITY ANGIOGRAM;  Surgeon: Lorretta Harp, MD;  Location: [facility] CATH LAB;  Service: Cardiovascular;  Laterality: N/A;  . LOWER EXTREMITY ANGIOGRAPHY Bilateral 05/08/2017   Procedure: Lower Extremity Angiography;  Surgeon: Lorretta Harp, MD;  Location: [place] [facility];  Service: Cardiovascular;  Laterality: Bilateral;  . PERIPHERAL VASCULAR BALLOON ANGIOPLASTY Left 05/08/2017   Procedure: PERIPHERAL VASCULAR BALLOON ANGIOPLASTY;  Surgeon: Lorretta Harp, MD;  Location: [place] [facility];  Service: Cardiovascular;  Laterality: Left;  COMMON ILIAC  . RADIOACTIVE SEED IMPLANT N/A 04/11/2017   Procedure: RADIOACTIVE SEED IMPLANT/BRACHYTHERAPY IMPLANT;  Surgeon: Kathie Rhodes, MD;  Location[facility];  Service: Urology;  Laterality: N/A;    67   seeds implanted  . SPACE OAR INSTILLATION N/A 04/11/2017   Procedure: SPACE OAR INSTILLATION;  Surgeon: Kathie Rhodes, MD;  Location: [facility];  Service: Urology;  Laterality: N/A;  . TRANSTHORACIC ECHOCARDIOGRAM  07/15/2013   ef 77-82%, grade 1 diastolic dysfunction/  trivial MR, mild to moderate PR, mild TR   . UPPER ESOPHAGEAL ENDOSCOPIC ULTRASOUND (EUS) N/A 12/19/2017   Procedure: UPPER ESOPHAGEAL ENDOSCOPIC ULTRASOUND (EUS);  Surgeon: Carol Ada, MD;  Location: [facility];  Service: Endoscopy;  Laterality: N/A;    I have reviewed the social history and family history with the patient and they are unchanged from previous note.  ALLERGIES:  has No Known Allergies.  MEDICATIONS:  Current Outpatient Medications  Medication Sig Dispense Refill  . ACCU-CHEK  GUIDE test strip USE 1 THREE TIMES DAILY  5  . aspirin 81 MG chewable tablet Chew 1 tablet (81 mg total) by mouth daily.    . chlorproMAZINE (THORAZINE) 25 MG tablet Take 1 tablet (25 mg total) by mouth 3 (three) times daily as needed for hiccoughs. 20 tablet 0  . cilostazol (PLETAL) 50 MG tablet Take 1 tablet (50 mg total) by mouth 2 (two) times daily. 60 tablet 6  . clopidogrel (PLAVIX) 75 MG tablet Take 75 mg by mouth at bedtime.     Marland Kitchen dexamethasone (DECADRON) 4 MG tablet Take 1 tab daily with breakfast on day 3-5 after chemo 30 tablet 1  . dexlansoprazole (DEXILANT) 60 MG capsule Take 60 mg by mouth daily as needed (for reflux.).     Marland Kitchen doxycycline (VIBRAMYCIN) 100 MG capsule Take 100 mg by mouth  daily as needed for itching.  1  . hydrocortisone (ANUSOL-HC) 2.5 % rectal cream Place 1 application rectally 2 (two) times daily as needed for hemorrhoids. 30 g 1  . hydrocortisone 2.5 % ointment Apply 1 application topically 2 (two) times daily as needed (for skin irritation).   0  . lactulose (CHRONULAC) 10 GM/15ML solution TAKE 30 MLS (TWO TABLESPOONSFUL) BY MOUTH EVERY TWO HOURS AS NEEDED UNTIL BOWEL MOVEMENT 473 mL 1  . lidocaine-prilocaine (EMLA) cream Apply 1 application topically as needed (local anesthesia).    . LORazepam (ATIVAN) 1 MG tablet Take 1 tablet (1 mg total) by mouth at bedtime. 5 tablet 0  . metFORMIN (GLUCOPHAGE) 500 MG tablet Take 500 mg by mouth at bedtime.   2  . metoprolol succinate (TOPROL-XL) 25 MG 24 hr tablet Take 25 mg by mouth at bedtime.     . mirtazapine (REMERON) 15 MG tablet Take 1 tablet (15 mg total) by mouth at bedtime. 30 tablet 3  . morphine (MS CONTIN) 15 MG 12 hr tablet Take 1 tablet (15 mg total) by mouth every 8 (eight) hours. 90 tablet 0  . Multiple Vitamins-Minerals (MULTIVITAMIN WITH MINERALS) tablet Take 1 tablet by mouth daily.    . ondansetron (ZOFRAN) 8 MG tablet Take 1 tablet (8 mg total) by mouth 2 (two) times daily as needed for refractory nausea  / vomiting. Start on day 3 after chemotherapy. 30 tablet 2  . oxyCODONE (OXY IR/ROXICODONE) 5 MG immediate release tablet Take 1 tablet (5 mg total) by mouth every 6 (six) hours as needed for severe pain. 90 tablet 0  . prochlorperazine (COMPAZINE) 10 MG tablet Take 1 tablet (10 mg total) by mouth every 6 (six) hours as needed (NAUSEA). 30 tablet 1  . simvastatin (ZOCOR) 20 MG tablet Take 20 mg by mouth at bedtime.     . tamsulosin (FLOMAX) 0.4 MG CAPS capsule Take 0.4 mg by mouth every evening.  11   No current facility-administered medications for this visit.    Facility-Administered Medications Ordered in Other Visits  Medication Dose Route Frequency Provider Last Rate Last Dose  . sodium chloride flush (NS) 0.9 % injection 10 mL  10 mL Intracatheter Once Truitt Merle, MD        PHYSICAL EXAMINATION: ECOG PERFORMANCE STATUS: 2 - Symptomatic, <50% confined to bed  Vitals:   05/14/18 0939  BP: 121/69  Pulse: 90  Resp: 18  Temp: 98.5 F (36.9 C)  SpO2: 100%   Filed Weights   05/14/18 0939  Weight: 108 lb 3.2 oz (49.1 kg)    GENERAL:alert, no distress and comfortable (+) on wheelchair SKIN: skin color, texture, turgor are normal, no rashes or significant lesions EYES: normal, Conjunctiva are pink and non-injected, sclera clear OROPHARYNX:no exudate, no erythema and lips, buccal mucosa, and tongue normal  NECK: supple, thyroid normal size, non-tender, without nodularity LYMPH:  no palpable lymphadenopathy in the cervical, axillary or inguinal LUNGS: clear to auscultation and percussion with normal breathing effort HEART: regular rate & rhythm and no murmurs and no lower extremity edema ABDOMEN:abdomen soft, non-tender and normal bowel sounds Musculoskeletal:no cyanosis of digits and no clubbing  NEURO: alert & oriented x 3 with fluent speech, no focal motor/sensory deficits  LABORATORY DATA:  I have reviewed the data as listed CBC Latest Ref Rng & Units 05/14/2018 04/30/2018  04/09/2018  WBC 4.0 - 10.5 K/uL 10.1 6.8 9.2  Hemoglobin 13.0 - 17.0 g/dL 10.9(L) 10.8(L) 11.7(L)  Hematocrit 39.0 - 52.0 %  32.6(L) 32.8(L) 35.7(L)  Platelets 150 - 400 K/uL 254 254 243     CMP Latest Ref Rng & Units 05/14/2018 04/30/2018 04/09/2018  Glucose 70 - 99 mg/dL 161(H) 164(H) 120(H)  BUN 8 - 23 mg/dL _0 Creatinine 0.61 - 1.24 mg/dL 0.74 0.78 0.75  Sodium 135 - 145 mmol/L 138 137 138  Potassium 3.5 - 5.1 mmol/L 4.4 4.9 3.5  Chloride 98 - 111 mmol/L 101 100 99  CO2 22 - 32 mmol/L _1 Calcium 8.9 - 10.3 mg/dL 9.6 9.4 9.5  Total Protein 6.5 - 8.1 g/dL 7.0 6.9 7.0  Total Bilirubin 0.3 - 1.2 mg/dL 0.4 0.5 0.4  Alkaline Phos 38 - 126 U/L 98 79 86  AST 15 - 41 U/L _2 ALT 0 - 44 U/L 35 17 16      RADIOGRAPHIC STUDIES: I have personally reviewed the radiological images as listed and agreed with the findings in the report. No results found.   ASSESSMENT & PLAN:  Michael Maddox is a 68 y.o. male with history of  1. Adenocarcinoma of body of pancreas, cT3N0Mx, unresectable, with indeterminate lung nodes and liver lesions, MSI unknown (insufficient tissue), BRCA mutation (-) -Diagnosed in 11/2017. Genetics negative. Treated with chemo. Currently on First-line FOLFIRINOX every 2 weeks with dose reduction, started in 01/21/18. Tolerating moderately well with dose reduction. -His tumor is unresectable, so not curable at this stage.  The goal of therapy is palliative to prolong his life -He still complains of abdominal pain that goes up to 9/10 in severity. He takes one 64m oxycodone pill per day and this provides short lived relief. I advised him to increase his MS Contin dose from 121mevery 12 hours to every 8 hours, and that he can use oxycodone every 4 hours as needed. -We again reviewed narcotics related to constipation, he also has a BM every 3 days. He uses laxatives including lactulose, senna, and miralax as needed. I advised him to increase his laxative dose as  needed. -Labs reviewed, CBC showed 10.9. CMP CA 19.9 pending. -Will hold chemo this week due to his upcoming celiac block procedure on January 27. He states that chemo makes him tired a few days after chemo. Will restart chemo next week. -We will repeat staging scan in March  2. Constipation and abdominal pain  -Uses Miralax, Lactulose, and Senakot-S as needed. -Uses MS Contin 1545mnce a day and oxycodone BID. He is interested in a celiac block, but would like to continue medications for now. -He is scheduled to have celiac block next week. -He still complains of abdominal pain that goes up to 9/10 in severity. He takes one oxycodone pill per day and this provides short lived relief. I advised him to increase his MS Contin dose. -He also has a BM every 3 days. He uses laxatives including lactulose, senna, and miralax as needed. I advised him to increase his laxative dose as needed.  3. Low appetite, weight loss, Nausea  -Secondary to chemo and malignancy. -Currently on mirtazapine, zofran, compazine, and dexamethasone. Continue and f/u with dietician.  4. Depression and Anxiety -Currently on Ativan and mirtazapine. Continue.  5. Adenocarcinoma of the prostate stage T1c,s/p radioactive seed implant per Dr. ManTammi Klippel 04/11/17 -f/u with Dr. OttKarsten Ro.HTN, HL,DM -f/u with PCP and continue medications  7. TIA in 2010 s/p endarterectomy, CAD s/p CABG in 2015, PVD -s/p revascularization and stenting in the LEs -Currently on Plavix and  aspirin. Continue and f/u with cardiologist Dr. [name]  8. Goal of care discussion  -Patient understands his cancer is incurable at this stage, and the goal of care is palliative to prolong his life and preserve his quality of life  -he is full code now    Plan  -I refilled MS Contin and will increase to 50 mg every 8 hours, he will use oxycodone 5 mg every 4-6 hours as needed -refilled lorazepam, mirtazapine, lactulose and ondansetron today -Will  hold chemo this week and postpone to next week due to his celiac nerve block procedure on Monday, January 27 -He will have a celiac block next week -f/u next week before chemo   No problem-specific Assessment & Plan notes found for this encounter.   No orders of the defined types were placed in this encounter.  All questions were answered. The patient knows to call the clinic with any problems, questions or concerns. No barriers to learning was detected. I spent 20 minutes counseling the patient face to face. The total time spent in the appointment was 25 minutes and more than 50% was on counseling and review of test results  I, Noor Dweik am acting as scribe for Dr. Truitt Merle.  I have reviewed the above documentation for accuracy and completeness, and I agree with the above.     Truitt Merle, MD 05/14/2018

## 2018-05-06 NOTE — Consult Note (Signed)
Chief Complaint: Patient was seen in consultation today for  Chief Complaint  Patient presents with  . Consult    Consult for Celiac Plexus Block   at the request of Feng,Yan  Referring Physician(s): Feng,Yan  History of Present Illness: Michael Maddox is a 68 y.o. 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  Indeterminate lung nodules and liver lesions.  Chemotherapy began 10-20 19, initially tolerated poorly, now on dose reduction, tolerating moderately well.  He has persistent abdominal pain.  He uses MS Contin 50 mg twice daily for pain control.  He uses oxycodone twice daily with incomplete pain control.  He was not approved for OxyContin.  He is interested in alternative pain control options.  Past Medical History:  Diagnosis Date  . Arthritis   . Carotid artery disease ([place]) followed by dr berry--- per last duplex [id number]  LICA 84-69%, bilateral ECA >50%,  patent RICA post intervention   hx acute high grade stenosis RICA >80% 02-22-2009 in setting of  stroke  s/p  right carotid endarterectomy 02-28-2009/    . Claudication of both lower extremities ([place])    due to PAD ----  right > left  . COPD (chronic obstructive pulmonary disease) ([place])   . Coronary artery disease CARDIOLOGIST-  DR BERRY   hx positive myoview 07-29-2013;  08-16-2013 per cardiac cath, severe 3V CAD w/ 60% subclavian stenosis;  08-23-2013   s/p  CABG x3  (LIMA to LAD, SVG to OM1, SVG to PDA)  . CVA (cerebral vascular accident) ([place]) 02/22/2009   acute infarct right hemisphere w/ severe RICA stentosis----  per pt no residuals  . Family history of brain cancer   . Family history of breast cancer   . GERD (gastroesophageal reflux disease)   . HTN (hypertension)   . Hyperlipidemia   . Hyperplasia of prostate with lower urinary tract symptoms (LUTS)   . Peripheral arterial disease ([place]) followed by dr berry--- last dobbler ABIs bilateral  SFA occlusions (pt is scheduled for intervention 05-08-2017   hx PTA and stenting to bilateral SFA 2002 and PTA w/ stenting left common iliac artery and for in-stent restenosis 2003 Left SFA and 2005  stenting right SFA for in-stent restenosis  . Prostate cancer [facility]) urologist-  dr ottelin/  oncologist-  dr Tammi Klippel   dx 11-20-2016-- Stage T1c, Gleason 4+3, PSA 5.73, vol 35.95cc--- scheduled for radioactive seed implants 04-11-2017  . 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 S/P peripheral artery angioplasty with stent placement    bilateral SFA 2002;  in-stenosis bilateral SFA  (right 2003, left 2005) and left CIA 2003  . Shortness of breath   . Type II diabetes mellitus ([place])   . Weak urinary stream   . Wears glasses     Past Surgical History:  Procedure Laterality Date  . ABDOMINAL AORTOGRAM N/A 05/08/2017   Procedure: ABDOMINAL AORTOGRAM;  Surgeon: Lorretta Harp, MD;  Location: [place] [facility];  Service: Cardiovascular;  Laterality: N/A;  . CARDIAC CATHETERIZATION  12-01-2000    dr berry    non-critial CAD & nonischemic cardiolite-- mLAD 50%, mLCx 70%, prox. to mid RCA 75% focal midsegment 80-85%  . CARDIOVASCULAR STRESS TEST  07-29-2013    dr berry   High risk nuclear study w/ inferoseptal ischemia/  normal LV function and wall motion,  nuclear study ef 52%  . CAROTID ENDARTERECTOMY  Right 02-28-2009    dr c. Scot Dock  [facility]   patch angioplasty  . COLONOSCOPY    . CORONARY ARTERY BYPASS GRAFT N/A 08/23/2013   Procedure: CORONARY ARTERY BYPASS GRAFTING (CABG);  Surgeon: Melrose Nakayama, MD;  Location: [place];  Service: Open Heart Surgery;  Laterality: N/A;  Coronary Artery Bypass graft times three using left internal mammary artery and left leg saphenous vein   . CYSTOSCOPY N/A 04/11/2017   Procedure: CYSTOSCOPY FLEXIBLE;  Surgeon: Kathie Rhodes, MD;  Location: [facility];  Service: Urology;  Laterality: N/A;  no seeds found in bladder  .  ENDOVASCULAR STENT INSERTION Bilateral right 12-25-2000 ;  left 03-02-2001   dr berry   PTA and stenting to bilateral SFA  . ENDOVASCULAR STENT INSERTION Bilateral right 08-30-2003 ;  left 11-10-2001    dr berry   PTA and stenting for in-stent restenosis right SFA 08-30-2003:  PTA and stenting distal left common iliac artery and in-stent restenosis left SFA 11-10-2001  . FINE NEEDLE ASPIRATION N/A 12/19/2017   Procedure: FINE NEEDLE ASPIRATION (FNA) LINEAR;  Surgeon: Carol Ada, MD;  Location: [facility];  Service: Endoscopy;  Laterality: N/A;  . INTRAOPERATIVE TRANSESOPHAGEAL ECHOCARDIOGRAM N/A 08/23/2013   Procedure: INTRAOPERATIVE TRANSESOPHAGEAL ECHOCARDIOGRAM;  Surgeon: Melrose Nakayama, MD;  Location: [place];  Service: Open Heart Surgery;  Laterality: N/A;  . IR IMAGING GUIDED PORT INSERTION  01/20/2018  . LEFT HEART CATHETERIZATION WITH CORONARY ANGIOGRAM N/A 08/16/2013   Procedure: LEFT HEART CATHETERIZATION WITH CORONARY ANGIOGRAM;  Surgeon: Lorretta Harp, MD;  Location: [facility];  Service: Cardiovascular;  Laterality: N/A;   severe 3V CAD and 60% subclavian sternosis  . LOWER EXTREMITY ANGIOGRAM N/A 08/16/2013   Procedure: LOWER EXTREMITY ANGIOGRAM;  Surgeon: Lorretta Harp, MD;  Location: [facility];  Service: Cardiovascular;  Laterality: N/A;  . LOWER EXTREMITY ANGIOGRAPHY Bilateral 05/08/2017   Procedure: Lower Extremity Angiography;  Surgeon: Lorretta Harp, MD;  Location: [place] [facility];  Service: Cardiovascular;  Laterality: Bilateral;  . PERIPHERAL VASCULAR BALLOON ANGIOPLASTY Left 05/08/2017   Procedure: PERIPHERAL VASCULAR BALLOON ANGIOPLASTY;  Surgeon: Lorretta Harp, MD;  Location: [place] [facility];  Service: Cardiovascular;  Laterality: Left;  COMMON ILIAC  . RADIOACTIVE SEED IMPLANT N/A 04/11/2017   Procedure: RADIOACTIVE SEED IMPLANT/BRACHYTHERAPY IMPLANT;  Surgeon: Kathie Rhodes, MD;  Location: [facility];  Service: Urology;  Laterality:  N/A;    67   seeds implanted  . SPACE OAR INSTILLATION N/A 04/11/2017   Procedure: SPACE OAR INSTILLATION;  Surgeon: Kathie Rhodes, MD;  Location: [facility];  Service: Urology;  Laterality: N/A;  . TRANSTHORACIC ECHOCARDIOGRAM  07/15/2013   ef 44-81%, grade 1 diastolic dysfunction/  trivial MR, mild to moderate PR, mild TR   . UPPER ESOPHAGEAL ENDOSCOPIC ULTRASOUND (EUS) N/A 12/19/2017   Procedure: UPPER ESOPHAGEAL ENDOSCOPIC ULTRASOUND (EUS);  Surgeon: Carol Ada, MD;  Location: [facility];  Service: Endoscopy;  Laterality: N/A;    Allergies: Patient has no known allergies.  Medications: Prior to Admission medications   Medication Sig Start Date End Date Taking? Authorizing Provider  ACCU-CHEK GUIDE test strip USE 1 THREE TIMES DAILY 02/23/18   [provider]  aspirin 81 MG chewable tablet Chew 1 tablet (81 mg total) by mouth daily. Patient not taking: Reported on 04/30/2018 08/17/13   Brett Canales, PA-C  chlorproMAZINE (THORAZINE) 25 MG tablet Take 1 tablet (25 mg total) by mouth 3 (three) times daily as needed  for hiccoughs. 02/21/18   Fawze, Mina A, PA-C  cilostazol (PLETAL) 50 MG tablet Take 1 tablet (50 mg total) by mouth 2 (two) times daily. 05/21/17   Lorretta Harp, MD  clopidogrel (PLAVIX) 75 MG tablet Take 75 mg by mouth at bedtime.  07/21/13   [provider]  dexamethasone (DECADRON) 4 MG tablet Take 1 tab daily with breakfast on day 3-5 after chemo 03/05/18   Truitt Merle, MD  dexlansoprazole (DEXILANT) 60 MG capsule Take 60 mg by mouth daily as needed (for reflux.).     [provider]  doxycycline (VIBRAMYCIN) 100 MG capsule Take 100 mg by mouth daily as needed for itching. 12/24/17   [provider]  hydrocortisone (ANUSOL-HC) 2.5 % rectal cream Place 1 application rectally 2 (two) times daily as needed for hemorrhoids. 02/03/18   Alla Feeling, NP  hydrocortisone 2.5 % ointment Apply 1 application topically 2 (two) times  daily as needed (for skin irritation).  02/18/17   [provider]  lactulose (CHRONULAC) 10 GM/15ML solution TAKE 30 MLS (TWO TABLESPOONSFUL) BY MOUTH EVERY TWO HOURS AS NEEDED UNTIL BOWEL MOVEMENT 02/25/18   [provider]  lidocaine-prilocaine (EMLA) cream Apply 1 application topically as needed (local anesthesia).    [provider]  LORazepam (ATIVAN) 1 MG tablet Take 1 tablet (1 mg total) by mouth at bedtime. 02/21/18   Fawze, Mina A, PA-C  metFORMIN (GLUCOPHAGE) 500 MG tablet Take 500 mg by mouth at bedtime.  04/14/17   [provider]  metoprolol succinate (TOPROL-XL) 25 MG 24 hr tablet Take 25 mg by mouth at bedtime.  08/31/13   [provider]  mirtazapine (REMERON) 15 MG tablet Take 15 mg by mouth at bedtime. 02/25/18   [provider]  mirtazapine (REMERON) 7.5 MG tablet Take 1 tablet (7.5 mg total) by mouth at bedtime. Patient not taking: Reported on 04/30/2018 01/21/18   Alla Feeling, NP  morphine (MS CONTIN) 15 MG 12 hr tablet Take 1 tablet (15 mg total) by mouth every 12 (twelve) hours. 04/20/18   Truitt Merle, MD  Multiple Vitamins-Minerals (MULTIVITAMIN WITH MINERALS) tablet Take 1 tablet by mouth daily.    [provider]  ondansetron (ZOFRAN) 8 MG tablet Take 1 tablet (8 mg total) by mouth 2 (two) times daily as needed for refractory nausea / vomiting. Start on day 3 after chemotherapy. 05/01/18   Truitt Merle, MD  oxyCODONE (OXY IR/ROXICODONE) 5 MG immediate release tablet Take 1 tablet (5 mg total) by mouth every 6 (six) hours as needed for severe pain. 04/29/18   Truitt Merle, MD  prochlorperazine (COMPAZINE) 10 MG tablet Take 1 tablet (10 mg total) by mouth every 6 (six) hours as needed (NAUSEA). 05/01/18   Truitt Merle, MD  simvastatin (ZOCOR) 20 MG tablet Take 20 mg by mouth at bedtime.  08/31/13   [provider]  tamsulosin (FLOMAX) 0.4 MG CAPS capsule Take 0.4 mg by mouth every evening. 12/10/17   [provider]    traMADol (ULTRAM) 50 MG tablet Take 50 mg by mouth every 6 (six) hours as needed (pain).     [provider]     Family History  Problem Relation Age of Onset  . Diabetes Mother   . Breast cancer Mother 73  . Heart disease Father   . Brain cancer Maternal Grandfather     Social History   Socioeconomic History  . Marital status: Married    Spouse name: Not on file  .  Number of children: 3  . Years of education: Not on file  . Highest education level: Not on file  Occupational History  . Not on file  Social Needs  . Financial resource strain: Not on file  . Food insecurity:    Worry: Not on file    Inability: Not on file  . Transportation needs:    Medical: Not on file    Non-medical: Not on file  Tobacco Use  . Smoking status: Former Smoker    Packs/day: 1.00    Years: 35.00    Pack years: 35.00    Types: Cigarettes    Last attempt to quit: 05/04/2013    Years since quitting: 5.0  . Smokeless tobacco: Never Used  Substance and Sexual Activity  . Alcohol use: Yes    Alcohol/week: 4.0 standard drinks    Types: 4 Standard drinks or equivalent per week    Comment: none in 2 months, previously 1-2 drinks per week   . Drug use: No  . Sexual activity: Not on file  Lifestyle  . Physical activity:    Days per week: Not on file    Minutes per session: Not on file  . Stress: Not on file  Relationships  . Social connections:    Talks on phone: Not on file    Gets together: Not on file    Attends religious service: Not on file    Active member of club or organization: Not on file    Attends meetings of clubs or organizations: Not on file    Relationship status: Not on file  Other Topics Concern  . Not on file  Social History Narrative  . Not on file    ECOG Status: 1 - Symptomatic but completely ambulatory Physical Exam Constitutional: Oriented to person, place, and time.  Thin, frail-appearing. No distress.  Last Weight  Most recent update: 05/06/2018   2:41 PM   Weight  49.9 kg (110 lb)           HENT:  Head: Normocephalic and atraumatic.  Eyes: Conjunctivae and EOM are normal. Right eye exhibits no discharge. Left eye exhibits no discharge. No scleral icterus.  Neck: No JVD present.  Pulmonary/Chest: Effort normal. No stridor. No respiratory distress.  Abdomen: soft, non distended Neurological:  alert and oriented to person, place, and time.  Skin: Skin is warm and dry.  not diaphoretic.  Psychiatric:   normal mood and affect.   behavior is normal. Judgment and thought content normal.   Review of Systems: A 12 point ROS discussed and pertinent positives are indicated in the HPI above.  All other systems are negative.  Review of Systems  Vital Signs: BP 134/63   Pulse 92   Temp 98.1 F (36.7 C) (Oral)   Resp 14   Ht 5\' 11"  (1.803 m)   Wt 49.9 kg   SpO2 96%   BMI 15.34 kg/m    Mallampati Score:     Imaging: CT ABDOMEN AND PELVIS WITH CONTRAST  TECHNIQUE: Multidetector CT imaging of the abdomen and pelvis was performed using the standard protocol following bolus administration of intravenous contrast.  CONTRAST: 140mL OMNIPAQUE IOHEXOL 300 MG/ML SOLN  COMPARISON: MRI 01/05/2018  FINDINGS: Lower chest: Lung bases are clear.  Hepatobiliary: On the arterial phase imaging there is no clear early enhancing lesion. Non uniform vascular enhancement is present with increased early enhancement in the central LEFT hepatic lobe around the gallbladder fossa  On the more delayed venous phase  imaging there is a hypo enhancing lesion in the RIGHT hepatic lobe adjacent the gallbladder fossa measuring 12 mm (image 40/7). This lesion not seen on comparison MRI. Smaller hypo enhancing lesion more superiorly in the central LEFT hepatic lobe measuring 7 mm (image 30/7) similar to comparison exam. In the dome a of the LEFT hepatic lobe centrally hypointense lesion appears similar prior MRI (image 13/7).  Gallbladder normal.  No biliary duct dilatation  Pancreas: Again demonstrated hypoenhancing mass involving the body pancreas and extending to involve the central arterial structures of the celiac trunk and SMA. Mass measures approximately 3.7 by 3.6 by 2.4 cm is similar to comparison exam (3.8 x 3.4 by 3.5 cm). Second rounded lesion adjacent to the uncinate of the pancreas likely represents a necrotic node or direct tumor extension. This lesion measures 2.5 by 2.6 cm in axial dimension (image 48/7) compared with 2.1 by 2.4 cm.  There is pancreatic duct dilatation proximal to obstructive lesion in the body the pancreas (image 42/7 ) similar to comparison MRI  Spleen: Normal spleen  Adrenals/urinary tract: Adrenal glands are thickened. Kidneys appear normal. Bladder normal  Stomach/Bowel: Stomach, duodenum, small-bowel cecum normal. Colon and rectosigmoid colon normal.  Vascular/Lymphatic: Abdominal aorta is normal caliber with atherosclerotic calcification. There is no retroperitoneal or periportal lymphadenopathy. No pelvic lymphadenopathy.  Heavy calcification of the iliac arteries. stent in the LEFT common iliac artery. There is poor opacification of the iliac arteries particularly on the RIGHT. reconstitution of the femoral arteries.  Reproductive: Brachytherapy seeds in the prostate gland  Other: No free fluid.  Musculoskeletal: No aggressive osseous lesion.  IMPRESSION: 1. No significant change in large pancreatic body mass with pancreatic ductal obstruction. No biliary obstruction 2. No change in size of peripancreatic mass adjacent to uncinate consistent with nodal metastasis or direct tumor extension. 3. Vascular involvement of the celiac trunk and SMA by the pancreatic cancer similar to prior. 4. Hypodense lesion along the gallbladder fossa not seen on comparison MRI is indeterminate. Consider follow-up MRI for further characterization. Two other hypodense lesions in the liver  appear similar to comparison exam MRI. 5. Obstruction of the iliac arteries by atherosclerotic disease. Reconstitution of the femoral arteries by collateral flow.   Electronically Signed By: Suzy Bouchard M.D. On: 04/06/2018 15:01    Labs:  CBC: Recent Labs    03/05/18 1009 03/26/18 0831 04/09/18 0902 04/30/18 0759  WBC 8.0 6.5 9.2 6.8  HGB 11.1* 11.9* 11.7* 10.8*  HCT 33.5* 36.9* 35.7* 32.8*  PLT 245 278 243 254    COAGS: Recent Labs    05/08/17 0905 01/20/18 1358  INR 0.96 0.94    BMP: Recent Labs    03/05/18 1009 03/26/18 0831 04/09/18 0902 04/30/18 0759  NA 140 136 138 137  K 4.1 4.1 3.5 4.9  CL 104 101 99 100  CO2 26 23 29 26   GLUCOSE 196* 286* 120* 164*  BUN 10 18 10 13   CALCIUM 9.2 9.7 9.5 9.4  CREATININE 0.80 1.10 0.75 0.78  GFRNONAA >60 >60 >60 >60  GFRAA >60 >60 >60 >60    LIVER FUNCTION TESTS: Recent Labs    03/05/18 1009 03/26/18 0831 04/09/18 0902 04/30/18 0759  BILITOT 0.3 0.3 0.4 0.5  AST 15 15 18 17   ALT 14 24 16 17   ALKPHOS 77 80 86 79  PROT 6.6 7.1 7.0 6.9  ALBUMIN 3.8 4.0 4.2 3.9    TUMOR MARKERS: No results for input(s): AFPTM, CEA, CA199, CHROMGRNA in the last 8760  hours.  Assessment and Plan:  My impression is that this patient is having severe incompletely controlled pain secondary to his locally invasive unresectable pancreatic adenocarcinoma.  He is on a very aggressive narcotic pain medication regimen with incomplete control of symptoms.  Clinically, he is an appropriate candidate for consideration of celiac plexus block and neurlolysis.  Based on review of   his CT, he should be anatomically approachable. I spent the majority of the consultation discussing with the patient and his spouse the pathophysiology of visceral pain especially in the setting of pancreatic processes such as carcinoma and pancreatitis.  We discussed the location and function of the celiac plexus (solar plexus).  We discussed technique of  celiac plexus block and neurolysis.  We discussed anticipated benefits, time course of symptom resolution, possible risks and complications including bleeding, organ damage, nontarget deposition, incompletely controlled or recurrent pain.  They seem to understand and did ask appropriate questions.  He is motivated to proceed soon as possible.  Therefore, we can set him up electively as an outpatient for CT-guided celiac plexus block and neurolysis at [facility], with moderate sedation.  Will need to get him off his Plavix for 5 to 7 days preprocedure.  Thank you for this interesting consult.  I greatly enjoyed meeting AVYN COATE and look forward to participating in their care.  A copy of this report was sent to the requesting provider on this date.  Electronically Signed: Rickard Rhymes 05/06/2018, 4:06 PM   I spent a total of  30 Minutes   in face to face in clinical consultation, greater than 50% of which was counseling/coordinating care for severe  visceral pain secondary to unresectable pancreatic adenocarcinoma.

## 2018-05-07 ENCOUNTER — Encounter: Payer: Self-pay | Admitting: Radiology

## 2018-05-12 ENCOUNTER — Other Ambulatory Visit: Payer: Self-pay | Admitting: Physician Assistant

## 2018-05-13 DIAGNOSIS — H6501 Acute serous otitis media, right ear: Secondary | ICD-10-CM | POA: Diagnosis not present

## 2018-05-13 DIAGNOSIS — C251 Malignant neoplasm of body of pancreas: Secondary | ICD-10-CM | POA: Diagnosis not present

## 2018-05-13 DIAGNOSIS — I10 Essential (primary) hypertension: Secondary | ICD-10-CM | POA: Diagnosis not present

## 2018-05-13 DIAGNOSIS — E1165 Type 2 diabetes mellitus with hyperglycemia: Secondary | ICD-10-CM | POA: Diagnosis not present

## 2018-05-14 ENCOUNTER — Inpatient Hospital Stay (HOSPITAL_BASED_OUTPATIENT_CLINIC_OR_DEPARTMENT_OTHER): Payer: Medicare Other | Admitting: Hematology

## 2018-05-14 ENCOUNTER — Encounter: Payer: Self-pay | Admitting: Hematology

## 2018-05-14 ENCOUNTER — Telehealth: Payer: Self-pay

## 2018-05-14 ENCOUNTER — Inpatient Hospital Stay: Payer: Medicare Other

## 2018-05-14 VITALS — BP 121/69 | HR 90 | Temp 98.5°F | Resp 18 | Ht 71.0 in | Wt 108.2 lb

## 2018-05-14 DIAGNOSIS — I2510 Atherosclerotic heart disease of native coronary artery without angina pectoris: Secondary | ICD-10-CM

## 2018-05-14 DIAGNOSIS — J449 Chronic obstructive pulmonary disease, unspecified: Secondary | ICD-10-CM | POA: Diagnosis not present

## 2018-05-14 DIAGNOSIS — K769 Liver disease, unspecified: Secondary | ICD-10-CM | POA: Diagnosis not present

## 2018-05-14 DIAGNOSIS — Z79899 Other long term (current) drug therapy: Secondary | ICD-10-CM | POA: Diagnosis not present

## 2018-05-14 DIAGNOSIS — Z5189 Encounter for other specified aftercare: Secondary | ICD-10-CM | POA: Diagnosis not present

## 2018-05-14 DIAGNOSIS — I10 Essential (primary) hypertension: Secondary | ICD-10-CM

## 2018-05-14 DIAGNOSIS — K5903 Drug induced constipation: Secondary | ICD-10-CM | POA: Diagnosis not present

## 2018-05-14 DIAGNOSIS — C251 Malignant neoplasm of body of pancreas: Secondary | ICD-10-CM | POA: Diagnosis not present

## 2018-05-14 DIAGNOSIS — Z95828 Presence of other vascular implants and grafts: Secondary | ICD-10-CM

## 2018-05-14 DIAGNOSIS — C61 Malignant neoplasm of prostate: Secondary | ICD-10-CM | POA: Diagnosis not present

## 2018-05-14 DIAGNOSIS — Z87891 Personal history of nicotine dependence: Secondary | ICD-10-CM | POA: Diagnosis not present

## 2018-05-14 DIAGNOSIS — K649 Unspecified hemorrhoids: Secondary | ICD-10-CM

## 2018-05-14 DIAGNOSIS — Z5111 Encounter for antineoplastic chemotherapy: Secondary | ICD-10-CM | POA: Diagnosis not present

## 2018-05-14 DIAGNOSIS — R918 Other nonspecific abnormal finding of lung field: Secondary | ICD-10-CM

## 2018-05-14 DIAGNOSIS — E119 Type 2 diabetes mellitus without complications: Secondary | ICD-10-CM

## 2018-05-14 LAB — CMP (CANCER CENTER ONLY)
ALK PHOS: 98 U/L (ref 38–126)
ALT: 35 U/L (ref 0–44)
AST: 29 U/L (ref 15–41)
Albumin: 3.9 g/dL (ref 3.5–5.0)
Anion gap: 11 (ref 5–15)
BUN: 10 mg/dL (ref 8–23)
CALCIUM: 9.6 mg/dL (ref 8.9–10.3)
CO2: 26 mmol/L (ref 22–32)
Chloride: 101 mmol/L (ref 98–111)
Creatinine: 0.74 mg/dL (ref 0.61–1.24)
GFR, Est AFR Am: >60 mL/min (ref >60)
GFR, Estimated: >60 mL/min (ref >60)
Glucose, Bld: 161 mg/dL — ABNORMAL HIGH (ref 70–99)
Potassium: 4.4 mmol/L (ref 3.5–5.1)
Sodium: 138 mmol/L (ref 135–145)
Total Bilirubin: 0.4 mg/dL (ref 0.3–1.2)
Total Protein: 7 g/dL (ref 6.5–8.1)

## 2018-05-14 LAB — CBC WITH DIFFERENTIAL (CANCER CENTER ONLY)
Abs Immature Granulocytes: 0.08 10*3/uL — ABNORMAL HIGH (ref 0.00–0.07)
Basophils Absolute: 0 10*3/uL (ref 0.0–0.1)
Basophils Relative: 0 %
Eosinophils Absolute: 0 10*3/uL (ref 0.0–0.5)
Eosinophils Relative: 0 %
HEMATOCRIT: 32.6 % — AB (ref 39.0–52.0)
Hemoglobin: 10.9 g/dL — ABNORMAL LOW (ref 13.0–17.0)
Immature Granulocytes: 1 %
Lymphocytes Relative: 7 %
Lymphs Abs: 0.7 10*3/uL (ref 0.7–4.0)
MCH: 31.9 pg (ref 26.0–34.0)
MCHC: 33.4 g/dL (ref 30.0–36.0)
MCV: 95.3 fL (ref 80.0–100.0)
Monocytes Absolute: 0.5 10*3/uL (ref 0.1–1.0)
Monocytes Relative: 5 %
Neutro Abs: 8.8 10*3/uL — ABNORMAL HIGH (ref 1.7–7.7)
Neutrophils Relative %: 87 %
Platelet Count: 254 10*3/uL (ref 150–400)
RBC: 3.42 MIL/uL — ABNORMAL LOW (ref 4.22–5.81)
RDW: 13.2 % (ref 11.5–15.5)
WBC Count: 10.1 10*3/uL (ref 4.0–10.5)
nRBC: 0 % (ref 0.0–0.2)

## 2018-05-14 MED ORDER — HEPARIN SOD (PORK) LOCK FLUSH 100 UNIT/ML IV SOLN
500.0000 [IU] | Freq: Once | INTRAVENOUS | Status: AC
  Administered 2018-05-14: 500 [IU] via INTRAVENOUS
  Filled 2018-05-14: qty 5

## 2018-05-14 MED ORDER — LORAZEPAM 1 MG PO TABS: 1.0000 mg | ORAL_TABLET | Freq: Every day | ORAL | 0 refills | Status: DC

## 2018-05-14 MED ORDER — MORPHINE SULFATE ER 15 MG PO TBCR: 15.0000 mg | EXTENDED_RELEASE_TABLET | Freq: Three times a day (TID) | ORAL | 0 refills | Status: DC

## 2018-05-14 MED ORDER — SODIUM CHLORIDE 0.9% FLUSH
10.0000 mL | INTRAVENOUS | Status: DC | PRN
  Administered 2018-05-14: 10 mL
  Filled 2018-05-14: qty 10

## 2018-05-14 MED ORDER — LORAZEPAM 1 MG PO TABS: 1.0000 mg | ORAL_TABLET | Freq: Every day | ORAL | 0 refills | Status: AC

## 2018-05-14 MED ORDER — MIRTAZAPINE 15 MG PO TABS: 15.0000 mg | ORAL_TABLET | Freq: Every day | ORAL | 3 refills | Status: AC

## 2018-05-14 MED ORDER — LACTULOSE 10 GM/15ML PO SOLN: ORAL | 1 refills | Status: AC

## 2018-05-14 MED ORDER — ONDANSETRON HCL 8 MG PO TABS: 8.0000 mg | ORAL_TABLET | Freq: Two times a day (BID) | ORAL | 2 refills | Status: DC | PRN

## 2018-05-14 NOTE — Patient Instructions (Signed)

## 2018-05-14 NOTE — Telephone Encounter (Signed)
Printed avs and calender of upcoming appointment. Per 1/23 los Added inf overrides to book. Family member off day Is on Thursday

## 2018-05-14 NOTE — Telephone Encounter (Signed)
Called and left a detailed msg concerning patient upcoming appointment. Per 1/23 los

## 2018-05-15 ENCOUNTER — Other Ambulatory Visit (HOSPITAL_COMMUNITY): Payer: Self-pay | Admitting: Radiology

## 2018-05-15 ENCOUNTER — Other Ambulatory Visit: Payer: Self-pay | Admitting: Radiology

## 2018-05-15 LAB — CANCER ANTIGEN 19-9: CA 19-9: 3946 U/mL — ABNORMAL HIGH (ref 0–35)

## 2018-05-15 NOTE — Progress Notes (Signed)
Patient was to stop all blood thinners for his upcoming CT guided celiac plexus block and neurolysis. He was instructed to stop his Plavix and he did so on 05/12/18. The patient is also on Pletal, which he was not told to stop taking initially. Per Dr. Kathlene Cote if the patient will stop this medication as of today he will be ok to proceed with procedure on Monday 05/18/18. Patty, RN tried to reach patient by phone to give this information. There was no answer and VM was full. Patty then called the patient's wife and left a VM on her phone to DC the Pletal immediately. JM

## 2018-05-18 ENCOUNTER — Other Ambulatory Visit: Payer: Self-pay

## 2018-05-18 ENCOUNTER — Ambulatory Visit (HOSPITAL_COMMUNITY)
Admission: RE | Admit: 2018-05-18 | Discharge: 2018-05-18 | Disposition: A | Discharge status: Home / Self Care | Payer: Medicare Other | Source: Ambulatory Visit | Attending: Interventional Radiology | Admitting: Interventional Radiology

## 2018-05-18 ENCOUNTER — Encounter (HOSPITAL_COMMUNITY): Payer: Self-pay

## 2018-05-18 DIAGNOSIS — C259 Malignant neoplasm of pancreas, unspecified: Secondary | ICD-10-CM

## 2018-05-18 DIAGNOSIS — R109 Unspecified abdominal pain: Secondary | ICD-10-CM | POA: Diagnosis not present

## 2018-05-18 LAB — CBC
HCT: 42.4 % (ref 39.0–52.0)
Hemoglobin: 13.7 g/dL (ref 13.0–17.0)
MCH: 31.5 pg (ref 26.0–34.0)
MCHC: 32.3 g/dL (ref 30.0–36.0)
MCV: 97.5 fL (ref 80.0–100.0)
Platelets: 284 10*3/uL (ref 150–400)
RBC: 4.35 MIL/uL (ref 4.22–5.81)
RDW: 12.9 % (ref 11.5–15.5)
WBC: 5.3 10*3/uL (ref 4.0–10.5)
nRBC: 0 % (ref 0.0–0.2)

## 2018-05-18 LAB — GLUCOSE, CAPILLARY
Glucose-Capillary: 64 mg/dL — ABNORMAL LOW (ref 70–99)
Glucose-Capillary: 136 mg/dL — ABNORMAL HIGH (ref 70–99)
Glucose-Capillary: 102 mg/dL — ABNORMAL HIGH (ref 70–99)

## 2018-05-18 LAB — PROTIME-INR
INR: 1.07
Prothrombin Time: 13.8 seconds (ref 11.4–15.2)

## 2018-05-18 LAB — APTT: aPTT: 29 seconds (ref 24–36)

## 2018-05-18 MED ORDER — METHYLPREDNISOLONE ACETATE 80 MG/ML IJ SUSP
80.0000 mg | INTRAMUSCULAR | Status: DC
  Filled 2018-05-18: qty 1

## 2018-05-18 MED ORDER — LIDOCAINE HCL 1 % IJ SOLN
INTRAMUSCULAR | Status: AC
  Filled 2018-05-18: qty 40

## 2018-05-18 MED ORDER — FENTANYL CITRATE (PF) 100 MCG/2ML IJ SOLN
INTRAMUSCULAR | Status: AC
  Filled 2018-05-18: qty 4

## 2018-05-18 MED ORDER — MIDAZOLAM HCL 2 MG/2ML IJ SOLN
INTRAMUSCULAR | Status: AC
  Filled 2018-05-18: qty 6

## 2018-05-18 MED ORDER — FENTANYL CITRATE (PF) 100 MCG/2ML IJ SOLN
INTRAMUSCULAR | Status: AC | PRN
  Administered 2018-05-18 (×2): 50 ug via INTRAVENOUS

## 2018-05-18 MED ORDER — DEXTROSE 50 % IV SOLN
INTRAVENOUS | Status: AC
  Administered 2018-05-18: 50 mL
  Filled 2018-05-18: qty 50

## 2018-05-18 MED ORDER — MIDAZOLAM HCL 2 MG/2ML IJ SOLN
INTRAMUSCULAR | Status: AC | PRN
  Administered 2018-05-18 (×2): 1 mg via INTRAVENOUS

## 2018-05-18 MED ORDER — HYDROCODONE-ACETAMINOPHEN 5-325 MG PO TABS
1.0000 | ORAL_TABLET | ORAL | Status: DC | PRN
  Filled 2018-05-18: qty 2

## 2018-05-18 MED ORDER — ALCOHOL 98 % IJ SOLN
30.0000 mL | Freq: Once | INTRAMUSCULAR | Status: AC
  Administered 2018-05-18: 30 mL via INTRAVENOUS
  Filled 2018-05-18: qty 40

## 2018-05-18 MED ORDER — SODIUM CHLORIDE 0.9 % IV SOLN: INTRAVENOUS | Status: DC

## 2018-05-18 NOTE — Sedation Documentation (Signed)
Patient is resting comfortably. 

## 2018-05-18 NOTE — Discharge Instructions (Signed)
RETURN TO ER OR CALL 911 IF ANY PROBLEMS,QUESTIONS, OR CONCERNS; CALL IF ANY BLEEDING,DRAINAGE,FEVER,PAIN,SWELLING OR REDNESS AT ABDOMINAL BANDAGE SITES  Moderate Conscious Sedation, Adult, Care After These instructions provide you with information about caring for yourself after your procedure. Your health care provider may also give you more specific instructions. Your treatment has been planned according to current medical practices, but problems sometimes occur. Call your health care provider if you have any problems or questions after your procedure. What can I expect after the procedure? After your procedure, it is common:  To feel sleepy for several hours.  To feel clumsy and have poor balance for several hours.  To have poor judgment for several hours.  To vomit if you eat too soon. Follow these instructions at home: For at least 24 hours after the procedure:   Do not: ? Participate in activities where you could fall or become injured. ? Drive. ? Use heavy machinery. ? Drink alcohol. ? Take sleeping pills or medicines that cause drowsiness. ? Make important decisions or sign legal documents. ? Take care of children on your own.  Rest. Eating and drinking  Follow the diet recommended by your health care provider.  If you vomit: ? Drink water, juice, or soup when you can drink without vomiting. ? Make sure you have little or no nausea before eating solid foods. General instructions  Have a responsible adult stay with you until you are awake and alert.  Take over-the-counter and prescription medicines only as told by your health care provider.  If you smoke, do not smoke without supervision.  Keep all follow-up visits as told by your health care provider. This is important. Contact a health care provider if:  You keep feeling nauseous or you keep vomiting.  You feel light-headed.  You develop a rash.  You have a fever. Get help right away if:  You have  trouble breathing. This information is not intended to replace advice given to you by your health care provider. Make sure you discuss any questions you have with your health care provider. Document Released: 01/27/2013 Document Revised: 09/11/2015 Document Reviewed: 07/29/2015 Elsevier Interactive Patient Education  2019 [employer].

## 2018-05-18 NOTE — Procedures (Signed)
  Procedure: CT celiac plexus block and neurolysis   EBL:   minimal Complications:  none immediate  See full dictation in [facility].  Dillard Cannon MD Main # [phone] Pager  [phone]

## 2018-05-18 NOTE — H&P (Addendum)
Chief Complaint: Patient was seen in consultation today for celiac plexus block with neurolysis at the request of Dr Ky Barban  Supervising Physician: Arne Cleveland  Patient Status: [facility] - Out-pt  History of Present Illness: Michael Maddox is a 68 y.o. male   Pancreatic cancer; vascular invasion Persistent severe abdominal pain Narcotic pain meds with little to no relief  Referred to Dr Vernard Gambles to discuss celiac plexus block/neurolysis Consult 05/06/18: My impression is that this patient is having severe incompletely controlled pain secondary to his locally invasive unresectable pancreatic adenocarcinoma.  He is on a very aggressive narcotic pain medication regimen with incomplete control of symptoms.  Clinically, he is an appropriate candidate for consideration of celiac plexus block and neurlolysis.  Based on review of   his CT, he should be anatomically approachable. I spent the majority of the consultation discussing with the patient and his spouse the pathophysiology of visceral pain especially in the setting of pancreatic processes such as carcinoma and pancreatitis.  We discussed the location and function of the celiac plexus (solar plexus).  We discussed technique of celiac plexus block and neurolysis.  We discussed anticipated benefits, time course of symptom resolution, possible risks and complications including bleeding, organ damage, nontarget deposition, incompletely controlled or recurrent pain.  They seem to understand and did ask appropriate questions.  He is motivated to proceed soon as possible.  Therefore, we can set him up electively as an outpatient for CT-guided celiac plexus block and neurolysis at [facility], with moderate sedation.    LD Plavix 1/21 LD Pletal 1/24   Past Medical History:  Diagnosis Date  . Arthritis   . Carotid artery disease ([place]) followed by dr berry--- per last duplex [id number]  LICA 38-10%, bilateral ECA >50%,  patent RICA post  intervention   hx acute high grade stenosis RICA >80% 02-22-2009 in setting of  stroke  s/p  right carotid endarterectomy 02-28-2009/    . Claudication of both lower extremities ([place])    due to PAD ----  right > left  . COPD (chronic obstructive pulmonary disease) ([place])   . Coronary artery disease CARDIOLOGIST-  DR BERRY   hx positive myoview 07-29-2013;  08-16-2013 per cardiac cath, severe 3V CAD w/ 60% subclavian stenosis;  08-23-2013   s/p  CABG x3  (LIMA to LAD, SVG to OM1, SVG to PDA)  . CVA (cerebral vascular accident) ([place]) 02/22/2009   acute infarct right hemisphere w/ severe RICA stentosis----  per pt no residuals  . Family history of brain cancer   . Family history of breast cancer   . GERD (gastroesophageal reflux disease)   . HTN (hypertension)   . Hyperlipidemia   . Hyperplasia of prostate with lower urinary tract symptoms (LUTS)   . Peripheral arterial disease ([place]) followed by dr berry--- last dobbler ABIs bilateral SFA occlusions (pt is scheduled for intervention 05-08-2017   hx PTA and stenting to bilateral SFA 2002 and PTA w/ stenting left common iliac artery and for in-stent restenosis 2003 Left SFA and 2005  stenting right SFA for in-stent restenosis  . Prostate cancer [facility]) urologist-  dr ottelin/  oncologist-  dr Tammi Klippel   dx 11-20-2016-- Stage T1c, Gleason 4+3, PSA 5.73, vol 35.95cc--- scheduled for radioactive seed implants 04-11-2017  . 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 S/P peripheral artery angioplasty with stent placement    bilateral SFA 2002;  in-stenosis bilateral SFA  (  right 2003, left 2005) and left CIA 2003  . Shortness of breath   . Type II diabetes mellitus ([place])   . Weak urinary stream   . Wears glasses     Past Surgical History:  Procedure Laterality Date  . ABDOMINAL AORTOGRAM N/A 05/08/2017   Procedure: ABDOMINAL AORTOGRAM;  Surgeon: Lorretta Harp, MD;  Location: [place] [facility];  Service: Cardiovascular;   Laterality: N/A;  . CARDIAC CATHETERIZATION  12-01-2000    dr berry    non-critial CAD & nonischemic cardiolite-- mLAD 50%, mLCx 70%, prox. to mid RCA 75% focal midsegment 80-85%  . CARDIOVASCULAR STRESS TEST  07-29-2013    dr berry   High risk nuclear study w/ inferoseptal ischemia/  normal LV function and wall motion,  nuclear study ef 52%  . CAROTID ENDARTERECTOMY Right 02-28-2009    dr c. Scot Dock  [facility]   patch angioplasty  . COLONOSCOPY    . CORONARY ARTERY BYPASS GRAFT N/A 08/23/2013   Procedure: CORONARY ARTERY BYPASS GRAFTING (CABG);  Surgeon: Melrose Nakayama, MD;  Location: [place];  Service: Open Heart Surgery;  Laterality: N/A;  Coronary Artery Bypass graft times three using left internal mammary artery and left leg saphenous vein   . CYSTOSCOPY N/A 04/11/2017   Procedure: CYSTOSCOPY FLEXIBLE;  Surgeon: Kathie Rhodes, MD;  Location: [facility];  Service: Urology;  Laterality: N/A;  no seeds found in bladder  . ENDOVASCULAR STENT INSERTION Bilateral right 12-25-2000 ;  left 03-02-2001   dr berry   PTA and stenting to bilateral SFA  . ENDOVASCULAR STENT INSERTION Bilateral right 08-30-2003 ;  left 11-10-2001    dr berry   PTA and stenting for in-stent restenosis right SFA 08-30-2003:  PTA and stenting distal left common iliac artery and in-stent restenosis left SFA 11-10-2001  . FINE NEEDLE ASPIRATION N/A 12/19/2017   Procedure: FINE NEEDLE ASPIRATION (FNA) LINEAR;  Surgeon: Carol Ada, MD;  Location: [facility];  Service: Endoscopy;  Laterality: N/A;  . INTRAOPERATIVE TRANSESOPHAGEAL ECHOCARDIOGRAM N/A 08/23/2013   Procedure: INTRAOPERATIVE TRANSESOPHAGEAL ECHOCARDIOGRAM;  Surgeon: Melrose Nakayama, MD;  Location: [place];  Service: Open Heart Surgery;  Laterality: N/A;  . IR IMAGING GUIDED PORT INSERTION  01/20/2018  . IR RADIOLOGIST EVAL & MGMT  05/06/2018  . LEFT HEART CATHETERIZATION WITH CORONARY ANGIOGRAM N/A 08/16/2013   Procedure: LEFT HEART CATHETERIZATION  WITH CORONARY ANGIOGRAM;  Surgeon: Lorretta Harp, MD;  Location: [facility] CATH LAB;  Service: Cardiovascular;  Laterality: N/A;   severe 3V CAD and 60% subclavian sternosis  . LOWER EXTREMITY ANGIOGRAM N/A 08/16/2013   Procedure: LOWER EXTREMITY ANGIOGRAM;  Surgeon: Lorretta Harp, MD;  Location: [facility] CATH LAB;  Service: Cardiovascular;  Laterality: N/A;  . LOWER EXTREMITY ANGIOGRAPHY Bilateral 05/08/2017   Procedure: Lower Extremity Angiography;  Surgeon: Lorretta Harp, MD;  Location: [place] [facility];  Service: Cardiovascular;  Laterality: Bilateral;  . PERIPHERAL VASCULAR BALLOON ANGIOPLASTY Left 05/08/2017   Procedure: PERIPHERAL VASCULAR BALLOON ANGIOPLASTY;  Surgeon: Lorretta Harp, MD;  Location: [place] [facility];  Service: Cardiovascular;  Laterality: Left;  COMMON ILIAC  . RADIOACTIVE SEED IMPLANT N/A 04/11/2017   Procedure: RADIOACTIVE SEED IMPLANT/BRACHYTHERAPY IMPLANT;  Surgeon: Kathie Rhodes, MD;  Location: [facility];  Service: Urology;  Laterality: N/A;    67   seeds implanted  . SPACE OAR INSTILLATION N/A 04/11/2017   Procedure: SPACE OAR INSTILLATION;  Surgeon: Kathie Rhodes, MD;  Location: [facility];  Service: Urology;  Laterality: N/A;  . TRANSTHORACIC ECHOCARDIOGRAM  07/15/2013   ef 19-62%, grade 1 diastolic dysfunction/  trivial MR, mild to moderate PR, mild TR   . UPPER ESOPHAGEAL ENDOSCOPIC ULTRASOUND (EUS) N/A 12/19/2017   Procedure: UPPER ESOPHAGEAL ENDOSCOPIC ULTRASOUND (EUS);  Surgeon: Carol Ada, MD;  Location: [facility];  Service: Endoscopy;  Laterality: N/A;    Allergies: Patient has no known allergies.  Medications: Prior to Admission medications   Medication Sig Start Date End Date Taking? Authorizing Provider  chlorproMAZINE (THORAZINE) 25 MG tablet Take 1 tablet (25 mg total) by mouth 3 (three) times daily as needed for hiccoughs. 02/21/18  Yes Fawze, Mina A, PA-C  cilostazol (PLETAL) 50 MG tablet Take 1 tablet (50 mg  total) by mouth 2 (two) times daily. 05/21/17  Yes Lorretta Harp, MD  clopidogrel (PLAVIX) 75 MG tablet Take 75 mg by mouth at bedtime.  07/21/13  Yes [provider]  dexamethasone (DECADRON) 4 MG tablet Take 1 tab daily with breakfast on day 3-5 after chemo 03/05/18  Yes Truitt Merle, MD  dexlansoprazole (DEXILANT) 60 MG capsule Take 60 mg by mouth daily as needed (for reflux.).    Yes [provider]  hydrocortisone (ANUSOL-HC) 2.5 % rectal cream Place 1 application rectally 2 (two) times daily as needed for hemorrhoids. 02/03/18  Yes Alla Feeling, NP  hydrocortisone 2.5 % ointment Apply 1 application topically 2 (two) times daily as needed (for skin irritation).  02/18/17  Yes [provider]  lactulose (CHRONULAC) 10 GM/15ML solution TAKE 30 MLS (TWO TABLESPOONSFUL) BY MOUTH EVERY TWO HOURS AS NEEDED UNTIL BOWEL MOVEMENT 05/14/18  Yes Truitt Merle, MD  lidocaine-prilocaine (EMLA) cream Apply 1 application topically as needed (local anesthesia).   Yes [provider]  LORazepam (ATIVAN) 1 MG tablet Take 1 tablet (1 mg total) by mouth at bedtime. 05/14/18  Yes Truitt Merle, MD  metFORMIN (GLUCOPHAGE) 500 MG tablet Take 500 mg by mouth at bedtime.  04/14/17  Yes [provider]  metoprolol succinate (TOPROL-XL) 25 MG 24 hr tablet Take 25 mg by mouth at bedtime.  08/31/13  Yes [provider]  mirtazapine (REMERON) 15 MG tablet Take 1 tablet (15 mg total) by mouth at bedtime. 05/14/18  Yes Truitt Merle, MD  morphine (MS CONTIN) 15 MG 12 hr tablet Take 1 tablet (15 mg total) by mouth every 8 (eight) hours. 05/14/18  Yes Truitt Merle, MD  Multiple Vitamins-Minerals (MULTIVITAMIN WITH MINERALS) tablet Take 1 tablet by mouth daily.   Yes [provider]  ondansetron (ZOFRAN) 8 MG tablet Take 1 tablet (8 mg total) by mouth 2 (two) times daily as needed for refractory nausea / vomiting. Start on day 3 after chemotherapy. 05/14/18  Yes Truitt Merle, MD  oxyCODONE  (OXY IR/ROXICODONE) 5 MG immediate release tablet Take 1 tablet (5 mg total) by mouth every 6 (six) hours as needed for severe pain. 04/29/18  Yes Truitt Merle, MD  prochlorperazine (COMPAZINE) 10 MG tablet Take 1 tablet (10 mg total) by mouth every 6 (six) hours as needed (NAUSEA). 05/01/18  Yes Truitt Merle, MD  simvastatin (ZOCOR) 20 MG tablet Take 20 mg by mouth at bedtime.  08/31/13  Yes [provider]  tamsulosin (FLOMAX) 0.4 MG CAPS capsule Take 0.4 mg by mouth every evening. 12/10/17  Yes [provider]  ACCU-CHEK GUIDE test strip USE 1 THREE TIMES DAILY 02/23/18   [provider]  aspirin 81 MG chewable tablet Chew 1 tablet (81 mg total) by  mouth daily. Patient not taking: Reported on 05/18/2018 08/17/13   Brett Canales, PA-C  doxycycline (VIBRAMYCIN) 100 MG capsule Take 100 mg by mouth daily as needed for itching. 12/24/17   [provider]     Family History  Problem Relation Age of Onset  . Diabetes Mother   . Breast cancer Mother 59  . Heart disease Father   . Brain cancer Maternal Grandfather     Social History   Socioeconomic History  . Marital status: Married    Spouse name: Not on file  . Number of children: 3  . Years of education: Not on file  . Highest education level: Not on file  Occupational History  . Not on file  Social Needs  . Financial resource strain: Not on file  . Food insecurity:    Worry: Not on file    Inability: Not on file  . Transportation needs:    Medical: Not on file    Non-medical: Not on file  Tobacco Use  . Smoking status: Former Smoker    Packs/day: 1.00    Years: 35.00    Pack years: 35.00    Types: Cigarettes    Last attempt to quit: 05/04/2013    Years since quitting: 5.0  . Smokeless tobacco: Never Used  Substance and Sexual Activity  . Alcohol use: Yes    Alcohol/week: 4.0 standard drinks    Types: 4 Standard drinks or equivalent per week    Comment: none in 2 months, previously 1-2 drinks per week    . Drug use: No  . Sexual activity: Not on file  Lifestyle  . Physical activity:    Days per week: Not on file    Minutes per session: Not on file  . Stress: Not on file  Relationships  . Social connections:    Talks on phone: Not on file    Gets together: Not on file    Attends religious service: Not on file    Active member of club or organization: Not on file    Attends meetings of clubs or organizations: Not on file    Relationship status: Not on file  Other Topics Concern  . Not on file  Social History Narrative  . Not on file     Review of Systems: A 12 point ROS discussed and pertinent positives are indicated in the HPI above.  All other systems are negative.  Review of Systems  Constitutional: Positive for activity change, appetite change, fatigue and unexpected weight change. Negative for fever.  Respiratory: Negative for cough and shortness of breath.   Cardiovascular: Negative for chest pain.  Gastrointestinal: Positive for abdominal pain and nausea.  Musculoskeletal: Positive for back pain and gait problem.  Neurological: Positive for weakness.  Psychiatric/Behavioral: Negative for behavioral problems and confusion.    Vital Signs: BP 99/87   Pulse (!) 110   Temp 98.2 F (36.8 C) (Oral)   Ht 5\' 11"  (1.803 m)   Wt 110 lb (49.9 kg)   SpO2 100%   BMI 15.34 kg/m   Physical Exam Vitals signs reviewed.  Constitutional:      Comments: Thin; frail  Cardiovascular:     Rate and Rhythm: Normal rate and regular rhythm.     Heart sounds: Normal heart sounds.  Pulmonary:     Effort: Pulmonary effort is normal.     Breath sounds: Normal breath sounds.  Abdominal:     General: Bowel sounds are normal.  Palpations: Abdomen is soft.     Tenderness: There is abdominal tenderness.  Musculoskeletal: Normal range of motion.  Skin:    General: Skin is warm and dry.  Neurological:     General: No focal deficit present.     Mental Status: He is alert and  oriented to person, place, and time.  Psychiatric:        Mood and Affect: Mood normal.        Behavior: Behavior normal.        Thought Content: Thought content normal.        Judgment: Judgment normal.     Imaging: Ir Radiologist Eval & Mgmt  Result Date: 05/06/2018 Please refer to notes tab for details about interventional procedure. (Op Note)   Labs:  CBC: Recent Labs    03/26/18 0831 04/09/18 0902 04/30/18 0759 05/14/18 0858  WBC 6.5 9.2 6.8 10.1  HGB 11.9* 11.7* 10.8* 10.9*  HCT 36.9* 35.7* 32.8* 32.6*  PLT 278 243 254 254    COAGS: Recent Labs    01/20/18 1358  INR 0.94    BMP: Recent Labs    03/26/18 0831 04/09/18 0902 04/30/18 0759 05/14/18 0858  NA 136 138 137 138  K 4.1 3.5 4.9 4.4  CL 101 99 100 101  CO2 23 29 26 26   GLUCOSE 286* 120* 164* 161*  BUN 18 10 13 10   CALCIUM 9.7 9.5 9.4 9.6  CREATININE 1.10 0.75 0.78 0.74  GFRNONAA >60 >60 >60 >60  GFRAA >60 >60 >60 >60    LIVER FUNCTION TESTS: Recent Labs    03/26/18 0831 04/09/18 0902 04/30/18 0759 05/14/18 0858  BILITOT 0.3 0.4 0.5 0.4  AST 15 18 17 29   ALT 24 16 17  35  ALKPHOS 80 86 79 98  PROT 7.1 7.0 6.9 7.0  ALBUMIN 4.0 4.2 3.9 3.9    TUMOR MARKERS: No results for input(s): AFPTM, CEA, CA199, CHROMGRNA in the last 8760 hours.  Assessment and Plan:  Pancreatic cancer; vascular invasion Persistent severe abd pain-- even with pain meds Consulted with Dr Vernard Gambles regarding celiac plexus block with Neurolysis Scheduled now for same LD Plavix 1/21--- LD Pletal 1/24 Pt is aware of procedure benefits and risks including but not limited to Infection; bleeding; organ damage; incomplete pain control; non target involvement Agreeable to proceed Consent signed and in chart   Thank you for this interesting consult.  I greatly enjoyed meeting LAMINE LATON and look forward to participating in their care.  A copy of this report was sent to the requesting provider on this  date.  Electronically Signed: Lavonia Drafts, PA-C 05/18/2018, 9:58 AM   I spent a total of  30 Minutes   in face to face in clinical consultation, greater than 50% of which was counseling/coordinating care for celiac plexus block- neurolysis

## 2018-05-18 NOTE — Progress Notes (Signed)
Hypoglycemic Event  CBG: 64  Treatment: D50 25 mL (12.5 gm)  Symptoms: Shaky  Follow-up CBG: Time: 1020 CBG Result:136  Possible Reasons for Event: Inadequate meal intake  Comments/MD notified:Pam Nonie Hoyer, PA informed     Mel Almond, Lisabeth [place]

## 2018-05-19 ENCOUNTER — Telehealth: Payer: Self-pay

## 2018-05-19 NOTE — Telephone Encounter (Signed)
Called and left a voice msg concerning patient upcoming appointment on 1/30 @ 7:45. Per 1/23 los (BOOK) approval.

## 2018-05-21 ENCOUNTER — Inpatient Hospital Stay: Payer: Medicare Other

## 2018-05-21 ENCOUNTER — Inpatient Hospital Stay: Payer: Medicare Other | Admitting: Hematology

## 2018-05-21 ENCOUNTER — Other Ambulatory Visit: Payer: Medicare Other

## 2018-05-21 ENCOUNTER — Telehealth: Payer: Self-pay | Admitting: Hematology

## 2018-05-21 NOTE — Telephone Encounter (Signed)
Called patient per 1/30 sch message - pt is aware of new appt date and time

## 2018-05-28 ENCOUNTER — Inpatient Hospital Stay: Payer: Medicare Other | Admitting: General Practice

## 2018-05-28 ENCOUNTER — Other Ambulatory Visit: Payer: Medicare Other

## 2018-05-28 ENCOUNTER — Ambulatory Visit: Payer: Medicare Other

## 2018-05-28 ENCOUNTER — Inpatient Hospital Stay: Payer: Medicare Other

## 2018-05-28 ENCOUNTER — Telehealth: Payer: Self-pay | Admitting: Radiology

## 2018-05-28 ENCOUNTER — Inpatient Hospital Stay: Payer: Medicare Other | Admitting: Hematology

## 2018-05-28 ENCOUNTER — Ambulatory Visit: Payer: Medicare Other | Admitting: Hematology

## 2018-05-28 NOTE — Telephone Encounter (Signed)
Call to check patient status 10 mo post Celiac Plexus Block.  Left message on patient's voice mail.  Michael Maddox, [place] 05/28/2018 10:24 AM

## 2018-05-29 ENCOUNTER — Telehealth: Payer: Self-pay | Admitting: Hematology

## 2018-05-29 NOTE — Telephone Encounter (Signed)
R/s appt from this week to 2/12 and 2/13 - pt is aware of apt date and time

## 2018-06-03 ENCOUNTER — Telehealth: Payer: Self-pay

## 2018-06-03 ENCOUNTER — Inpatient Hospital Stay: Payer: Medicare Other

## 2018-06-03 ENCOUNTER — Inpatient Hospital Stay: Payer: Medicare Other | Attending: Hematology

## 2018-06-03 ENCOUNTER — Inpatient Hospital Stay: Payer: Medicare Other | Admitting: Hematology

## 2018-06-03 DIAGNOSIS — K5900 Constipation, unspecified: Secondary | ICD-10-CM | POA: Insufficient documentation

## 2018-06-03 DIAGNOSIS — J449 Chronic obstructive pulmonary disease, unspecified: Secondary | ICD-10-CM | POA: Insufficient documentation

## 2018-06-03 DIAGNOSIS — I10 Essential (primary) hypertension: Secondary | ICD-10-CM | POA: Insufficient documentation

## 2018-06-03 DIAGNOSIS — C787 Secondary malignant neoplasm of liver and intrahepatic bile duct: Secondary | ICD-10-CM | POA: Insufficient documentation

## 2018-06-03 DIAGNOSIS — I959 Hypotension, unspecified: Secondary | ICD-10-CM | POA: Insufficient documentation

## 2018-06-03 DIAGNOSIS — C61 Malignant neoplasm of prostate: Secondary | ICD-10-CM | POA: Insufficient documentation

## 2018-06-03 DIAGNOSIS — Z79899 Other long term (current) drug therapy: Secondary | ICD-10-CM | POA: Insufficient documentation

## 2018-06-03 DIAGNOSIS — E119 Type 2 diabetes mellitus without complications: Secondary | ICD-10-CM | POA: Insufficient documentation

## 2018-06-03 DIAGNOSIS — C251 Malignant neoplasm of body of pancreas: Secondary | ICD-10-CM | POA: Insufficient documentation

## 2018-06-03 NOTE — Telephone Encounter (Signed)
Left voice message for patient's daughter Caryl Pina inquiring about patient's status.  He has an appointment this morning at 7:45 and did not come.  Requested she call me back and let me know if she has information requiring his decision to keep receiving treatment.

## 2018-06-04 ENCOUNTER — Encounter: Payer: Self-pay | Admitting: Hematology

## 2018-06-04 ENCOUNTER — Ambulatory Visit: Payer: Medicare Other | Admitting: Hematology

## 2018-06-04 ENCOUNTER — Telehealth: Payer: Self-pay

## 2018-06-04 ENCOUNTER — Other Ambulatory Visit: Payer: Medicare Other

## 2018-06-04 ENCOUNTER — Inpatient Hospital Stay: Payer: Medicare Other

## 2018-06-04 ENCOUNTER — Inpatient Hospital Stay (HOSPITAL_BASED_OUTPATIENT_CLINIC_OR_DEPARTMENT_OTHER): Payer: Medicare Other | Admitting: Hematology

## 2018-06-04 VITALS — BP 77/58 | HR 101 | Temp 97.9°F | Resp 18 | Wt 98.8 lb

## 2018-06-04 DIAGNOSIS — E119 Type 2 diabetes mellitus without complications: Secondary | ICD-10-CM | POA: Diagnosis not present

## 2018-06-04 DIAGNOSIS — C787 Secondary malignant neoplasm of liver and intrahepatic bile duct: Secondary | ICD-10-CM | POA: Diagnosis not present

## 2018-06-04 DIAGNOSIS — C251 Malignant neoplasm of body of pancreas: Secondary | ICD-10-CM

## 2018-06-04 DIAGNOSIS — K5900 Constipation, unspecified: Secondary | ICD-10-CM | POA: Diagnosis not present

## 2018-06-04 DIAGNOSIS — C61 Malignant neoplasm of prostate: Secondary | ICD-10-CM | POA: Diagnosis not present

## 2018-06-04 DIAGNOSIS — J449 Chronic obstructive pulmonary disease, unspecified: Secondary | ICD-10-CM | POA: Diagnosis not present

## 2018-06-04 DIAGNOSIS — Z95828 Presence of other vascular implants and grafts: Secondary | ICD-10-CM

## 2018-06-04 DIAGNOSIS — I959 Hypotension, unspecified: Secondary | ICD-10-CM | POA: Diagnosis not present

## 2018-06-04 DIAGNOSIS — E860 Dehydration: Secondary | ICD-10-CM

## 2018-06-04 DIAGNOSIS — Z79899 Other long term (current) drug therapy: Secondary | ICD-10-CM | POA: Diagnosis not present

## 2018-06-04 DIAGNOSIS — I10 Essential (primary) hypertension: Secondary | ICD-10-CM | POA: Diagnosis not present

## 2018-06-04 DIAGNOSIS — I700 Atherosclerosis of aorta: Secondary | ICD-10-CM

## 2018-06-04 LAB — CBC WITH DIFFERENTIAL (CANCER CENTER ONLY)
Abs Immature Granulocytes: 0.01 10*3/uL (ref 0.00–0.07)
Basophils Absolute: 0 10*3/uL (ref 0.0–0.1)
Basophils Relative: 0 %
Eosinophils Absolute: 0 10*3/uL (ref 0.0–0.5)
Eosinophils Relative: 0 %
HCT: 32.1 % — ABNORMAL LOW (ref 39.0–52.0)
Hemoglobin: 10.6 g/dL — ABNORMAL LOW (ref 13.0–17.0)
Immature Granulocytes: 0 %
LYMPHS ABS: 0.6 10*3/uL — AB (ref 0.7–4.0)
Lymphocytes Relative: 12 %
MCH: 30.9 pg (ref 26.0–34.0)
MCHC: 33 g/dL (ref 30.0–36.0)
MCV: 93.6 fL (ref 80.0–100.0)
MONOS PCT: 7 %
Monocytes Absolute: 0.3 10*3/uL (ref 0.1–1.0)
Neutro Abs: 4 10*3/uL (ref 1.7–7.7)
Neutrophils Relative %: 81 %
Platelet Count: 237 10*3/uL (ref 150–400)
RBC: 3.43 MIL/uL — ABNORMAL LOW (ref 4.22–5.81)
RDW: 12.8 % (ref 11.5–15.5)
WBC Count: 5 10*3/uL (ref 4.0–10.5)
nRBC: 0 % (ref 0.0–0.2)

## 2018-06-04 LAB — CMP (CANCER CENTER ONLY)
ALK PHOS: 70 U/L (ref 38–126)
ALT: 18 U/L (ref 0–44)
AST: 24 U/L (ref 15–41)
Albumin: 3.5 g/dL (ref 3.5–5.0)
Anion gap: 12 (ref 5–15)
BUN: 17 mg/dL (ref 8–23)
CHLORIDE: 99 mmol/L (ref 98–111)
CO2: 26 mmol/L (ref 22–32)
Calcium: 9.3 mg/dL (ref 8.9–10.3)
Creatinine: 0.81 mg/dL (ref 0.61–1.24)
GFR, Est AFR Am: >60 mL/min (ref >60)
GFR, Estimated: >60 mL/min (ref >60)
Glucose, Bld: 114 mg/dL — ABNORMAL HIGH (ref 70–99)
Potassium: 3.8 mmol/L (ref 3.5–5.1)
Sodium: 137 mmol/L (ref 135–145)
Total Bilirubin: 0.6 mg/dL (ref 0.3–1.2)
Total Protein: 6.5 g/dL (ref 6.5–8.1)

## 2018-06-04 MED ORDER — MEGESTROL ACETATE 625 MG/5ML PO SUSP: 625.0000 mg | Freq: Every day | ORAL | 0 refills | Status: AC

## 2018-06-04 MED ORDER — SODIUM CHLORIDE 0.9 % IV SOLN
INTRAVENOUS | Status: AC
  Administered 2018-06-04: 09:00:00 via INTRAVENOUS
  Filled 2018-06-04 (×2): qty 250

## 2018-06-04 MED ORDER — HEPARIN SOD (PORK) LOCK FLUSH 100 UNIT/ML IV SOLN
500.0000 [IU] | Freq: Once | INTRAVENOUS | Status: AC
  Administered 2018-06-04: 500 [IU]
  Filled 2018-06-04: qty 5

## 2018-06-04 MED ORDER — METOPROLOL TARTRATE 25 MG PO TABS: 12.5000 mg | ORAL_TABLET | Freq: Every day | ORAL | 0 refills | Status: AC

## 2018-06-04 MED ORDER — DRONABINOL 2.5 MG PO CAPS: 2.5000 mg | ORAL_CAPSULE | Freq: Two times a day (BID) | ORAL | 0 refills | Status: AC

## 2018-06-04 NOTE — Telephone Encounter (Signed)
Spoke with [employer] to try to obtain Prior Authorization for Megace, spoke with clinical rep and they are going to try to push this through, should hear within 24 hours.

## 2018-06-04 NOTE — Patient Instructions (Signed)
Dehydration, Adult  Dehydration is when there is not enough fluid or water in your body. This happens when you lose more fluids than you take in. Dehydration can range from mild to very bad. It should be treated right away to keep it from getting very bad. Symptoms of mild dehydration may include:  Thirst.  Dry lips.  Slightly dry mouth.  Dry, warm skin.  Dizziness. Symptoms of moderate dehydration may include:  Very dry mouth.  Muscle cramps.  Dark pee (urine). Pee may be the color of tea.  Your body making less pee.  Your eyes making fewer tears.  Heartbeat that is uneven or faster than normal (palpitations).  Headache.  Light-headedness, especially when you stand up from sitting.  Fainting (syncope). Symptoms of very bad dehydration may include:  Changes in skin, such as: ? Cold and clammy skin. ? Blotchy (mottled) or pale skin. ? Skin that does not quickly return to normal after being lightly pinched and let go (poor skin turgor).  Changes in body fluids, such as: ? Feeling very thirsty. ? Your eyes making fewer tears. ? Not sweating when body temperature is high, such as in hot weather. ? Your body making very little pee.  Changes in vital signs, such as: ? Weak pulse. ? Pulse that is more than 100 beats a minute when you are sitting still. ? Fast breathing. ? Low blood pressure.  Other changes, such as: ? Sunken eyes. ? Cold hands and feet. ? Confusion. ? Lack of energy (lethargy). ? Trouble waking up from sleep. ? Short-term weight loss. ? Unconsciousness. Follow these instructions at home:   If told by your doctor, drink an ORS: ? Make an ORS by using instructions on the package. ? Start by drinking small amounts, about  cup (120 mL) every 5-10 minutes. ? Slowly drink more until you have had the amount that your doctor said to have.  Drink enough clear fluid to keep your pee clear or pale yellow. If you were told to drink an ORS, finish the  ORS first, then start slowly drinking clear fluids. Drink fluids such as: ? Water. Do not drink only water by itself. Doing that can make the salt (sodium) level in your body get too low (hyponatremia). ? Ice chips. ? Fruit juice that you have added water to (diluted). ? Low-calorie sports drinks.  Avoid: ? Alcohol. ? Drinks that have a lot of sugar. These include high-calorie sports drinks, fruit juice that does not have water added, and soda. ? Caffeine. ? Foods that are greasy or have a lot of fat or sugar.  Take over-the-counter and prescription medicines only as told by your doctor.  Do not take salt tablets. Doing that can make the salt level in your body get too high (hypernatremia).  Eat foods that have minerals (electrolytes). Examples include bananas, oranges, potatoes, tomatoes, and spinach.  Keep all follow-up visits as told by your doctor. This is important. Contact a doctor if:  You have belly (abdominal) pain that: ? Gets worse. ? Stays in one area (localizes).  You have a rash.  You have a stiff neck.  You get angry or annoyed more easily than normal (irritability).  You are more sleepy than normal.  You have a harder time waking up than normal.  You feel: ? Weak. ? Dizzy. ? Very thirsty.  You have peed (urinated) only a small amount of very dark pee during 6-8 hours. Get help right away if:  You have   symptoms of very bad dehydration.  You cannot drink fluids without throwing up (vomiting).  Your symptoms get worse with treatment.  You have a fever.  You have a very bad headache.  You are throwing up or having watery poop (diarrhea) and it: ? Gets worse. ? Does not go away.  You have blood or something green (bile) in your throw-up.  You have blood in your poop (stool). This may cause poop to look black and tarry.  You have not peed in 6-8 hours.  You pass out (faint).  Your heart rate when you are sitting still is more than 100 beats a  minute.  You have trouble breathing. This information is not intended to replace advice given to you by your health care provider. Make sure you discuss any questions you have with your health care provider. Document Released: 02/02/2009 Document Revised: 10/27/2015 Document Reviewed: 06/02/2015 Elsevier Interactive Patient Education  2019 [place] new prescriptions as prescribed & call if any worsening changes.  Keep next appt to f/u with Dr Burr Medico.

## 2018-06-04 NOTE — Progress Notes (Signed)
Received notification from [employer] that Megace has been approved for one year.  [place] to let them know.

## 2018-06-04 NOTE — Progress Notes (Signed)
[name]   [phone] [phone]   Clinic Follow up Note   Patient Care Team: Nolene Ebbs, MD as PCP - General (Internal Medicine) Lorretta Harp, MD as PCP - Cardiology (Cardiology) Carol Ada, MD as Consulting Physician (Gastroenterology) Truitt Merle, MD as Consulting Physician (Hematology) Estevan Oaks, NP as Nurse Practitioner (Hospice and Palliative Medicine) 06/04/2018  CHIEF COMPLAINT: F/u on unresectable pancreatic cancer    SUMMARY OF ONCOLOGIC HISTORY: Oncology History   Cancer Staging Malignant neoplasm of body of pancreas [facility]) Staging form: Exocrine Pancreas, AJCC 8th Edition - Clinical stage from 12/19/2017: Stage IIA (cT3, cN0, cM0) - Signed by Truitt Merle, MD on 01/13/2018       Malignant neoplasm of body of pancreas ([place])   12/11/2017 Imaging    CT AP WITH CONTRAST IMPRESSION: Focal area of abnormal low attenuation in the pancreatic body measuring 3.7 x 2.9 cm with cut off of the duct most concerning for primary pancreatic neoplasm.  2 small low-attenuation foci identified within the liver which are nonspecific however could represent metastatic disease; one is 1.0 cm complex lesion within the hepatic dome, the other area is a low-attenuation focus adjacent to the falciform ligament likely representing a perfusion anomaly or possible local fatty infiltration  Extensive atherosclerotic changes in the abdominal aorta, iliac vessels, splenic vessels.  Occlusion of the right and possibly left iliac.  Suspected chronic re-constitution of flow identified within the femoral vessels bilaterally.  Prostate brachytherapy consistent with history of malignancy  No evidence of small bowel obstruction.  Findings suggestive of constipation     12/19/2017 Procedure    EUS biopsy: In the body of the pancreas there was a large 20 x 30 mm, approximately, hypoechoic mass. It had irregular margin, but it was round/oval in appearance.  There was pancreatic tail atrophy, but the PD was dilated up to 7.6 mm. The celiac axis was clearly viewed and there was no evidence of invasion. In the head of the pancreas a 1 cm lymph node was identified and the pancreas was lobular in the head and uncinate process. The left adrenal was clearly identified and it was enlarged, but there was no evidence of any masses.    12/19/2017 Initial Biopsy    Diagnosis FINE NEEDLE ASPIRATION,ENDOSCOPIC, PANCREAS BODY (SPECIMEN 1 OF 1 COLLECTED 12/19/17): MALIGNANT CELLS CONSISTENT WITH ADENOCARCINOMA.    12/19/2017 Initial Diagnosis    Malignant neoplasm of body of pancreas ([place])    12/19/2017 Cancer Staging    Staging form: Exocrine Pancreas, AJCC 8th Edition - Clinical stage from 12/19/2017: Stage IIA (cT3, cN0, cM0) - Signed by Truitt Merle, MD on 01/13/2018    01/05/2018 Imaging    01/05/2018 MRI Abdomen IMPRESSION: 1. Poorly marginated hypoenhancing 4.2 x 2.5 x 5.2 cm pancreatic body mass with pancreatic tail atrophy and duct dilation, compatible with known pancreatic adenocarcinoma. Pancreatic tumor appears locally advanced with significant vascular involvement as detailed. No biliary ductal dilatation. 2. No abdominal adenopathy. 3. Two similar subcentimeter enhancing left liver lobe masses as detailed, which are too small to accurately characterize on this mildly motion degraded scan. Differential includes small metastases or hemangiomas. Suggest follow-up MRI abdomen without and with IV contrast in 3 months. These lesions are probably too small to characterize by PET-CT. 4.  Aortic Atherosclerosis (ICD10-I70.0).    01/06/2018 Imaging    01/06/2018 CT Chest IMPRESSION: 1. Two non-specific small left lower lobe pulmonary nodules. Otherwise, no evidence of metastatic disease within the chest. 2.  Aortic Atherosclerosis (ICD10-I70.0).  Emphysema (ICD10-J43.9).    01/13/2018 Tumor Marker    Tumor Marker Results for Michael Maddox, Michael Maddox (MRN  [id number]) as of 01/13/2018 09:17  Ref. Range 12/30/2017 14:59  CA 19-9 Latest Ref Range: 0 - 35 U/mL 1,263 (H)      01/21/2018 -  Chemotherapy    FOLFIRINOX every 2 weeks starting 01/21/18. Increased to full dose oxaliplatin on cycle 2    02/03/2018 Genetic Testing    Negative genetic testing on the [facility] Multi-Cancer Panel + Brain Cancer Panel. The Multi-Cancer Panel offered by [facility] includes sequencing and/or deletion duplication testing of the following 84 genes: AIP, ALK, APC, ATM, AXIN2,BAP1,  BARD1, BLM, BMPR1A, BRCA1, BRCA2, BRIP1, CASR, CDC73, CDH1, CDK4, CDKN1B, CDKN1C, CDKN2A (p14ARF), CDKN2A (p16INK4a), CEBPA, CHEK2, CTNNA1, DICER1, DIS3L2, EGFR (c.2369C>T, p.Thr790Met variant only), EPCAM (Deletion/duplication testing only), FH, FLCN, GATA2, GPC3, GREM1 (Promoter region deletion/duplication testing only), HOXB13 (c.251G>A, p.Gly84Glu), HRAS, KIT, MAX, MEN1, MET, MITF (c.952G>A, p.Glu318Lys variant only), MLH1, MSH2, MSH3, MSH6, MUTYH, NBN, NF1, NF2, NTHL1, PALB2, PDGFRA, PHOX2B, PMS2, POLD1, POLE, POT1, PRKAR1A, PTCH1, PTEN, RAD50, RAD51C, RAD51D, RB1, RECQL4, RET, RUNX1, SDHAF2, SDHA (sequence changes only), SDHB, SDHC, SDHD, SMAD4, SMARCA4, SMARCB1, SMARCE1, STK11, SUFU, TERC, TERT, TMEM127, TP53, TSC1, TSC2, VHL, WRN and WT1.  The CNS/Brain Cancer Panel offered by [facility] includes sequencing and/or deletion duplication testing of the following 42 genes: ALK, APC, BAP1, BARD1, CDK4, CDKN2A, DICER1, EPCAM, EZH2, GPC3, HRAS, KIF1B, MEN1, MLH1, MSH2, MSH6, NF1, NF2, PHOX2B, PMS2, POT1, PRKAR1A, PTCH2, PTEN, RB1, SMARCA4, SMARCB1, SMARCE1, SUFU, TP53, TSC1, TSC2, and VHL.   The report date is 02/03/2018.     04/06/2018 Imaging    -CT AP W Constrast 04/06/18 IMPRESSION: 1. No significant change in large pancreatic body mass with pancreatic ductal obstruction. No biliary obstruction 2. No change in size of peripancreatic mass adjacent to uncinate consistent with nodal metastasis or direct  tumor extension. 3. Vascular involvement of the celiac trunk and SMA by the pancreatic cancer similar to prior. 4. Hypodense lesion along the gallbladder fossa not seen on comparison MRI is indeterminate. Consider follow-up MRI for further characterization. Two other hypodense lesions in the liver appear similar to comparison exam MRI. 5. Obstruction of the iliac arteries by atherosclerotic disease. Reconstitution of the femoral arteries by collateral flow.    05/18/2018 Procedure    he had a celiac block with Dr. Vernard Gambles on 05/18/2018.     CURRENT THERAPY  First lineFOLFIRINOX every 2 weeks starting 01/21/18  INTERVAL HISTORY: Michael Maddox is a 68 y.o. male who is here for follow-up. He had a CT guided celiac plexus block and neurolysis on 05/18/18 with Dr. Vernard Gambles.  He has canceled his appointment twice last week and earlier this week due to his fatigue and pain.  Today, he is here with his daughter and was seen in the infusion room. He is not doing well and is feeling weak. He describes pain after his procedure. The abdominal pain is improving since his surgery but he rates the pain severity 5/10. He takes oxycodone and morphine, 2 of each a day. Every time he stands up he gets dizzy and has lost weight since last visit. His BMs are improving but he is still constipated and takes Miralax, and lactulose. He denies cp, cough, fever or chills. Pt drinks about 3 16oz bottles of water a day and has a low appetite.   Pertinent positives and negatives of review of systems are listed and detailed within the  above HPI.  REVIEW OF SYSTEMS:  Constitutional: Denies fevers, chills, (+) weight loss, (+) dizziness when standing up, (+) low energy and weakness, (+) low appetite  Eyes: Denies blurriness of vision Ears, nose, mouth, throat, and face: Denies mucositis or sore throat Respiratory: Denies cough, dyspnea or wheezes Cardiovascular: Denies palpitation, chest discomfort or lower extremity  swelling Gastrointestinal:  Denies nausea, heartburn,(+) constipation, (+) abdominal pain after celiac block procedure  Skin: Denies abnormal skin rashes Lymphatics: Denies new lymphadenopathy or easy bruising Neurological:Denies numbness, tingling or new weaknesses Behavioral/Psych: Mood is stable, no new changes  All other systems were reviewed with the patient and are negative.  MEDICAL HISTORY:  Past Medical History:  Diagnosis Date  . Arthritis   . Carotid artery disease ([place]) followed by dr berry--- per last duplex [id number]  LICA 84-13%, bilateral ECA >50%,  patent RICA post intervention   hx acute high grade stenosis RICA >80% 02-22-2009 in setting of  stroke  s/p  right carotid endarterectomy 02-28-2009/    . Claudication of both lower extremities ([place])    due to PAD ----  right > left  . COPD (chronic obstructive pulmonary disease) ([place])   . Coronary artery disease CARDIOLOGIST-  DR BERRY   hx positive myoview 07-29-2013;  08-16-2013 per cardiac cath, severe 3V CAD w/ 60% subclavian stenosis;  08-23-2013   s/p  CABG x3  (LIMA to LAD, SVG to OM1, SVG to PDA)  . CVA (cerebral vascular accident) ([place]) 02/22/2009   acute infarct right hemisphere w/ severe RICA stentosis----  per pt no residuals  . Family history of brain cancer   . Family history of breast cancer   . GERD (gastroesophageal reflux disease)   . HTN (hypertension)   . Hyperlipidemia   . Hyperplasia of prostate with lower urinary tract symptoms (LUTS)   . Peripheral arterial disease ([place]) followed by dr berry--- last dobbler ABIs bilateral SFA occlusions (pt is scheduled for intervention 05-08-2017   hx PTA and stenting to bilateral SFA 2002 and PTA w/ stenting left common iliac artery and for in-stent restenosis 2003 Left SFA and 2005  stenting right SFA for in-stent restenosis  . Prostate cancer [facility]) urologist-  dr ottelin/  oncologist-  dr Tammi Klippel   dx 11-20-2016-- Stage T1c, Gleason 4+3, PSA 5.73, vol 35.95cc---  scheduled for radioactive seed implants 04-11-2017  . 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 S/P peripheral artery angioplasty with stent placement    bilateral SFA 2002;  in-stenosis bilateral SFA  (right 2003, left 2005) and left CIA 2003  . Shortness of breath   . Type II diabetes mellitus ([place])   . Weak urinary stream   . Wears glasses     SURGICAL HISTORY: Past Surgical History:  Procedure Laterality Date  . ABDOMINAL AORTOGRAM N/A 05/08/2017   Procedure: ABDOMINAL AORTOGRAM;  Surgeon: Lorretta Harp, MD;  Location: [facility];  Service: Cardiovascular;  Laterality: N/A;  . CARDIAC CATHETERIZATION  12-01-2000    dr berry    non-critial CAD & nonischemic cardiolite-- mLAD 50%, mLCx 70%, prox. to mid RCA 75% focal midsegment 80-85%  . CARDIOVASCULAR STRESS TEST  07-29-2013    dr berry   High risk nuclear study w/ inferoseptal ischemia/  normal LV function and wall motion,  nuclear study ef 52%  . CAROTID ENDARTERECTOMY Right 02-28-2009    dr c. Scot Dock  [facility]   patch angioplasty  . COLONOSCOPY    .  CORONARY ARTERY BYPASS GRAFT N/A 08/23/2013   Procedure: CORONARY ARTERY BYPASS GRAFTING (CABG);  Surgeon: Melrose Nakayama, MD;  Location: [facility];  Service: Open Heart Surgery;  Laterality: N/A;  Coronary Artery Bypass graft times three using left internal mammary artery and left leg saphenous vein   . CYSTOSCOPY N/A 04/11/2017   Procedure: CYSTOSCOPY FLEXIBLE;  Surgeon: Kathie Rhodes, MD;  Location: [facility];  Service: Urology;  Laterality: N/A;  no seeds found in bladder  . ENDOVASCULAR STENT INSERTION Bilateral right 12-25-2000 ;  left 03-02-2001   dr berry   PTA and stenting to bilateral SFA  . ENDOVASCULAR STENT INSERTION Bilateral right 08-30-2003 ;  left 11-10-2001    dr berry   PTA and stenting for in-stent restenosis right SFA 08-30-2003:  PTA and stenting distal left common iliac artery and in-stent restenosis left SFA  11-10-2001  . FINE NEEDLE ASPIRATION N/A 12/19/2017   Procedure: FINE NEEDLE ASPIRATION (FNA) LINEAR;  Surgeon: Carol Ada, MD;  Location: [facility];  Service: Endoscopy;  Laterality: N/A;  . INTRAOPERATIVE TRANSESOPHAGEAL ECHOCARDIOGRAM N/A 08/23/2013   Procedure: INTRAOPERATIVE TRANSESOPHAGEAL ECHOCARDIOGRAM;  Surgeon: Melrose Nakayama, MD;  Location: [facility];  Service: Open Heart Surgery;  Laterality: N/A;  . IR IMAGING GUIDED PORT INSERTION  01/20/2018  . IR RADIOLOGIST EVAL & MGMT  05/06/2018  . LEFT HEART CATHETERIZATION WITH CORONARY ANGIOGRAM N/A 08/16/2013   Procedure: LEFT HEART CATHETERIZATION WITH CORONARY ANGIOGRAM;  Surgeon: Lorretta Harp, MD;  Location: [facility];  Service: Cardiovascular;  Laterality: N/A;   severe 3V CAD and 60% subclavian sternosis  . LOWER EXTREMITY ANGIOGRAM N/A 08/16/2013   Procedure: LOWER EXTREMITY ANGIOGRAM;  Surgeon: Lorretta Harp, MD;  Location: [facility]ATH LAB;  Service: Cardiovascular;  Laterality: N/A;  . LOWER EXTREMITY ANGIOGRAPHY Bilateral 05/08/2017   Procedure: Lower Extremity Angiography;  Surgeon: Lorretta Harp, MD;  Location: [facility];  Service: Cardiovascular;  Laterality: Bilateral;  . PERIPHERAL VASCULAR BALLOON ANGIOPLASTY Left 05/08/2017   Procedure: PERIPHERAL VASCULAR BALLOON ANGIOPLASTY;  Surgeon: Lorretta Harp, MD;  Location: [facility];  Service: Cardiovascular;  Laterality: Left;  COMMON ILIAC  . RADIOACTIVE SEED IMPLANT N/A 04/11/2017   Procedure: RADIOACTIVE SEED IMPLANT/BRACHYTHERAPY IMPLANT;  Surgeon: Kathie Rhodes, MD;  Location: [facility];  Service: Urology;  Laterality: N/A;    67   seeds implanted  . SPACE OAR INSTILLATION N/A 04/11/2017   Procedure: SPACE OAR INSTILLATION;  Surgeon: Kathie Rhodes, MD;  Location: [facility];  Service: Urology;  Laterality: N/A;  . TRANSTHORACIC ECHOCARDIOGRAM  07/15/2013   ef 33-29%, grade 1 diastolic dysfunction/  trivial MR,  mild to moderate PR, mild TR   . UPPER ESOPHAGEAL ENDOSCOPIC ULTRASOUND (EUS) N/A 12/19/2017   Procedure: UPPER ESOPHAGEAL ENDOSCOPIC ULTRASOUND (EUS);  Surgeon: Carol Ada, MD;  Location: [facility];  Service: Endoscopy;  Laterality: N/A;    I have reviewed the social history and family history with the patient and they are unchanged from previous note.  ALLERGIES:  has No Known Allergies.  MEDICATIONS:  Current Outpatient Medications  Medication Sig Dispense Refill  . ACCU-CHEK GUIDE test strip USE 1 THREE TIMES DAILY  5  . aspirin 81 MG chewable tablet Chew 1 tablet (81 mg total) by mouth daily. (Patient not taking: Reported on 05/18/2018)    . chlorproMAZINE (THORAZINE) 25 MG tablet Take 1 tablet (25 mg total) by mouth 3 (three) times daily as needed for hiccoughs. 20 tablet  0  . cilostazol (PLETAL) 50 MG tablet Take 1 tablet (50 mg total) by mouth 2 (two) times daily. 60 tablet 6  . clopidogrel (PLAVIX) 75 MG tablet Take 75 mg by mouth at bedtime.     Marland Kitchen dexamethasone (DECADRON) 4 MG tablet Take 1 tab daily with breakfast on day 3-5 after chemo 30 tablet 1  . dexlansoprazole (DEXILANT) 60 MG capsule Take 60 mg by mouth daily as needed (for reflux.).     Marland Kitchen doxycycline (VIBRAMYCIN) 100 MG capsule Take 100 mg by mouth daily as needed for itching.  1  . dronabinol (MARINOL) 2.5 MG capsule Take 1 capsule (2.5 mg total) by mouth 2 (two) times daily before a meal. 60 capsule 0  . hydrocortisone (ANUSOL-HC) 2.5 % rectal cream Place 1 application rectally 2 (two) times daily as needed for hemorrhoids. 30 g 1  . hydrocortisone 2.5 % ointment Apply 1 application topically 2 (two) times daily as needed (for skin irritation).   0  . lactulose (CHRONULAC) 10 GM/15ML solution TAKE 30 MLS (TWO TABLESPOONSFUL) BY MOUTH EVERY TWO HOURS AS NEEDED UNTIL BOWEL MOVEMENT 473 mL 1  . lidocaine-prilocaine (EMLA) cream Apply 1 application topically as needed (local anesthesia).    . LORazepam (ATIVAN) 1 MG  tablet Take 1 tablet (1 mg total) by mouth at bedtime. 5 tablet 0  . megestrol (MEGACE ES) 625 MG/5ML suspension Take 5 mLs (625 mg total) by mouth daily. 150 mL 0  . metFORMIN (GLUCOPHAGE) 500 MG tablet Take 500 mg by mouth at bedtime.   2  . metoprolol succinate (TOPROL-XL) 25 MG 24 hr tablet Take 25 mg by mouth at bedtime.     . metoprolol tartrate (LOPRESSOR) 25 MG tablet Take 0.5 tablets (12.5 mg total) by mouth daily. 30 tablet 0  . mirtazapine (REMERON) 15 MG tablet Take 1 tablet (15 mg total) by mouth at bedtime. 30 tablet 3  . morphine (MS CONTIN) 15 MG 12 hr tablet Take 1 tablet (15 mg total) by mouth every 8 (eight) hours. 90 tablet 0  . Multiple Vitamins-Minerals (MULTIVITAMIN WITH MINERALS) tablet Take 1 tablet by mouth daily.    . ondansetron (ZOFRAN) 8 MG tablet Take 1 tablet (8 mg total) by mouth 2 (two) times daily as needed for refractory nausea / vomiting. Start on day 3 after chemotherapy. 30 tablet 2  . oxyCODONE (OXY IR/ROXICODONE) 5 MG immediate release tablet Take 1 tablet (5 mg total) by mouth every 6 (six) hours as needed for severe pain. 90 tablet 0  . prochlorperazine (COMPAZINE) 10 MG tablet Take 1 tablet (10 mg total) by mouth every 6 (six) hours as needed (NAUSEA). 30 tablet 1  . simvastatin (ZOCOR) 20 MG tablet Take 20 mg by mouth at bedtime.     . tamsulosin (FLOMAX) 0.4 MG CAPS capsule Take 0.4 mg by mouth every evening.  11   No current facility-administered medications for this visit.    Facility-Administered Medications Ordered in Other Visits  Medication Dose Maddox Frequency Provider Last Rate Last Dose  . sodium chloride flush (NS) 0.9 % injection 10 mL  10 mL Intracatheter Once Truitt Merle, MD        PHYSICAL EXAMINATION: ECOG PERFORMANCE STATUS: 3 - Symptomatic, >50% confined to bed  Vitals with BMI 06/04/2018  Height   Weight 98 lbs 13 oz  BMI 63.87  Systolic 564  Diastolic 68  Pulse 89  Respirations 16   GENERAL:alert, no distress and  comfortable SKIN: skin color, texture,  turgor are normal, no rashes or significant lesions EYES: normal, Conjunctiva are pink and non-injected, sclera clear OROPHARYNX:no exudate, no erythema and lips, buccal mucosa, and tongue normal  NECK: supple, thyroid normal size, non-tender, without nodularity LYMPH:  no palpable lymphadenopathy in the cervical, axillary or inguinal LUNGS: clear to auscultation and percussion with normal breathing effort HEART: regular rate & rhythm and no murmurs and no lower extremity edema ABDOMEN:abdomen soft, non-tender and normal bowel sounds, no rebound pain (+) tenderness on left side,  Musculoskeletal:no cyanosis of digits and no clubbing  NEURO: alert & oriented x 3 with fluent speech, no focal motor/sensory deficits  LABORATORY DATA:  I have reviewed the data as listed CBC Latest Ref Rng & Units 06/04/2018 05/18/2018 05/14/2018  WBC 4.0 - 10.5 K/uL 5.0 5.3 10.1  Hemoglobin 13.0 - 17.0 g/dL 10.6(L) 13.7 10.9(L)  Hematocrit 39.0 - 52.0 % 32.1(L) 42.4 32.6(L)  Platelets 150 - 400 K/uL 237 284 254     CMP Latest Ref Rng & Units 06/04/2018 05/14/2018 04/30/2018  Glucose 70 - 99 mg/dL 114(H) 161(H) 164(H)  BUN 8 - 23 mg/dL '17 10 13  '$ Creatinine 0.61 - 1.24 mg/dL 0.81 0.74 0.78  Sodium 135 - 145 mmol/L 137 138 137  Potassium 3.5 - 5.1 mmol/L 3.8 4.4 4.9  Chloride 98 - 111 mmol/L 99 101 100  CO2 22 - 32 mmol/L '26 26 26  '$ Calcium 8.9 - 10.3 mg/dL 9.3 9.6 9.4  Total Protein 6.5 - 8.1 g/dL 6.5 7.0 6.9  Total Bilirubin 0.3 - 1.2 mg/dL 0.6 0.4 0.5  Alkaline Phos 38 - 126 U/L 70 98 79  AST 15 - 41 U/L '24 29 17  '$ ALT 0 - 44 U/L 18 35 17   PROCEDURES: 05/18/2018 Celiac Block with Dr. Vernard Gambles   RADIOGRAPHIC STUDIES: I have personally reviewed the radiological images as listed and agreed with the findings in the report. No results found.   ASSESSMENT & PLAN:  Michael Maddox is a 68 y.o. male with history of  1. Adenocarcinoma of body of pancreas, cT3N0Mx,  unresectable, with indeterminate lung nodes and liver lesions, MSIunknown (insufficient tissue), BRCA mutation (-) -Diagnosed in 11/2017. Genetics negative. Treated with chemo. Currently on First-line FOLFIRINOX every 2 weeks with dose reduction, started in10/2/19. Tolerating moderately well with dose reduction. -His tumor is unresectable, he also has a questionable line in the liver metastasis, so not curable at this stage.  The goal of therapy is palliative to prolong his life -He anorexia celiac nerve block 2 weeks ago for his worsening abdominal pain.  He has been very fatigued, weak since then, and his performance status has further dropped significantly.  His pain has improved since the procedure, tolerable. -Due to his very poor performance status, I will hold on chemotherapy today and give him IV fluids. -I do not think he is a candidate for more intensive chemotherapy FOLFIRINOX, I will change his treatment to total single agent gemcitabine.  We will see him back next week and reassess him, if his performance status remains to be very poor like this, will consider palliative care alone.   2. Constipation and abdominal pain  -Uses Miralax, Lactulose, and Senakot-S as needed. -Uses MS Contin '15mg'$  twice a day and oxycodone as needed  -He had a celiac block with Dr. Vernard Gambles on 05/18/2018. -pain is tolerable, overall controlled.  3. Low appetite, weight loss, Nausea, generalized weakness -Secondary to chemo and malignancy. -Currently on mirtazapine, zofran, compazine, and dexamethasone. - f/u with  dietician. -I recommend him to try Megace for his low appetite, prescription called in.  He previously tried mirtazapine and did not tolerate well.  4. Depression and Anxiety -Currently on Ativan as needed.  He tried mirtazapine, did not tolerate well.  5. Adenocarcinoma of the prostate stage T1c,s/p radioactive seed implant per Dr. Tammi Klippel on 04/11/17 -f/u with Dr. Karsten Ro  6.HTN,  HL,DM -f/u with PCP and continue medications  7. TIA in 2010 s/p endarterectomy, CAD s/p CABG in 2015, PVD -s/p revascularization and stenting in the LEs -Currently on Plavix and aspirin.  - f/u with cardiologist Dr. [name]  8. Orthostatic hypotension -He is on metoprolol 25 mg daily and Flomax, both can affect his blood pressure.  He has intermittent dizziness when he stands up. -We checked his orthostatic vitals, his blood pressure dropped to 77/58 when he stands up. -We will decrease his metoprolol to 12.5 mg once daily, and he will take Flomax every other day -I will give IV fluids 1 L of normal saline today over 2 hours  9. Goal of care discussion  -Patient understands his cancer is incurable at this stage, and the goal of care is palliative to prolong his life and preserve his quality of life -he is full code now    Plan  - I will prescribe Megace  - I will decrease his metoprolol XL from 25 mg daily to metoprolol 12.5 mg daily  - I will hold chemotherapy today, and switch to gemcitabine next week if his PS improves  - He will receive IV fluids today  -Labs, flush and f/u on 2/27     No problem-specific Assessment & Plan notes found for this encounter.   No orders of the defined types were placed in this encounter.  All questions were answered. The patient knows to call the clinic with any problems, questions or concerns. No barriers to learning was detected. I spent 25 minutes counseling the patient face to face. The total time spent in the appointment was 30 minutes and more than 50% was on counseling and review of test results  I, Manson Allan am acting as scribe for Dr. Truitt Merle.  I have reviewed the above documentation for accuracy and completeness, and I agree with the above.     Truitt Merle, MD 06/04/2018

## 2018-06-04 NOTE — Progress Notes (Signed)
DISCONTINUE ON PATHWAY REGIMEN - Pancreatic     A cycle is every 14 days:     Oxaliplatin      Leucovorin      Irinotecan      5-Fluorouracil      5-Fluorouracil   **Always confirm dose/schedule in your pharmacy ordering system**  REASON: Toxicities / Adverse Event PRIOR TREATMENT: [id number]: FOLFIRINOX q14 Days x 4 Cycles TREATMENT RESPONSE: Stable Disease (SD)  START ON PATHWAY REGIMEN - Pancreatic     A cycle is every 28 days (3 weeks on and 1 week off):     Gemcitabine   **Always confirm dose/schedule in your pharmacy ordering system**  Patient Characteristics: Adenocarcinoma, Locally Advanced, Anatomically Unresectable, Second Line, MSS/pMMR or MSI Unknown, Fluoropyrimidine-Based Therapy  First Line Histology: Adenocarcinoma Current evidence of distant metastases<= No AJCC T Category: T3 AJCC N Category: N0 AJCC M Category: M0 AJCC 8 Stage Grouping: IIA Line of Therapy: Second Line Microsatellite/Mismatch Repair Status: MSS/pMMR Intent of Therapy: Non-Curative / Palliative Intent, Discussed with Patient

## 2018-06-05 ENCOUNTER — Telehealth: Payer: Self-pay | Admitting: *Deleted

## 2018-06-05 ENCOUNTER — Telehealth: Payer: Self-pay

## 2018-06-05 NOTE — Telephone Encounter (Signed)
Patient's wife left a voice message for a return call pertaining to medications.  Attempt made to return patient's call- left a voice message for a return call.

## 2018-06-05 NOTE — Telephone Encounter (Signed)
Attempted to reach the patient to let him know that Megace was approved by his insurance company, no answer and voice mail box is full.  Call his daughter Caryl Pina and informed her, she will let her parents know.

## 2018-06-09 ENCOUNTER — Telehealth: Payer: Self-pay

## 2018-06-09 NOTE — Telephone Encounter (Signed)
Spoke with patient's daughter Caryl Pina, she called with questions regarding upcoming appointments on 2/27, per Dr. Burr Medico  Explained to her that he will be re-accessed at his next visit, may not receive treatment, will have to see.  Dr. Burr Medico spoke to them about Palliative Care, she states they did have one visit from a nurse a while back so they have their information.

## 2018-06-12 ENCOUNTER — Telehealth: Payer: Self-pay

## 2018-06-12 NOTE — Telephone Encounter (Signed)
Spoke with patient's daughter regarding his blood sugar.  Per Dr. Burr Medico as long as he is eating okay to take Metformin once daily.  Recommended they follow up with his PCP regarding his diabetic medication.  She states he is eating a little more.  He has follow up with Dr. Burr Medico on 2/27.  She verbalized an understanding.

## 2018-06-12 NOTE — Telephone Encounter (Signed)
Patient's daughter Caryl Pina calls with update on patient.  His blood sugars had been running really low so they stopped his Metformin.  Now has increased to over 200 which they brought down with with Novolog.  They are wondering if they could just give him the Metformin once a day.  Her [phone]

## 2018-06-16 NOTE — Progress Notes (Signed)
[place]   Telephone:(336) [phone] Fax:(336) [phone]   Clinic Follow up Note   Patient Care Team: Nolene Ebbs, MD as PCP - General (Internal Medicine) Lorretta Harp, MD as PCP - Cardiology (Cardiology) Carol Ada, MD as Consulting Physician (Gastroenterology) Truitt Merle, MD as Consulting Physician (Hematology) Estevan Oaks, NP as Nurse Practitioner (Hospice and Palliative Medicine) 06/18/2018  CHIEF COMPLAINT: F/u on unresectable pancreatic cancer   SUMMARY OF ONCOLOGIC HISTORY: Oncology History   Cancer Staging Malignant neoplasm of body of pancreas [facility]) Staging form: Exocrine Pancreas, AJCC 8th Edition - Clinical stage from 12/19/2017: Stage IIA (cT3, cN0, cM0) - Signed by Truitt Merle, MD on 01/13/2018       Malignant neoplasm of body of pancreas ([place])   12/11/2017 Imaging    CT AP WITH CONTRAST IMPRESSION: Focal area of abnormal low attenuation in the pancreatic body measuring 3.7 x 2.9 cm with cut off of the duct most concerning for primary pancreatic neoplasm.  2 small low-attenuation foci identified within the liver which are nonspecific however could represent metastatic disease; one is 1.0 cm complex lesion within the hepatic dome, the other area is a low-attenuation focus adjacent to the falciform ligament likely representing a perfusion anomaly or possible local fatty infiltration  Extensive atherosclerotic changes in the abdominal aorta, iliac vessels, splenic vessels.  Occlusion of the right and possibly left iliac.  Suspected chronic re-constitution of flow identified within the femoral vessels bilaterally.  Prostate brachytherapy consistent with history of malignancy  No evidence of small bowel obstruction.  Findings suggestive of constipation     12/19/2017 Procedure    EUS biopsy: In the body of the pancreas there was a large 20 x 30 mm, approximately, hypoechoic mass. It had irregular margin, but it was round/oval in appearance. There  was pancreatic tail atrophy, but the PD was dilated up to 7.6 mm. The celiac axis was clearly viewed and there was no evidence of invasion. In the head of the pancreas a 1 cm lymph node was identified and the pancreas was lobular in the head and uncinate process. The left adrenal was clearly identified and it was enlarged, but there was no evidence of any masses.    12/19/2017 Initial Biopsy    Diagnosis FINE NEEDLE ASPIRATION,ENDOSCOPIC, PANCREAS BODY (SPECIMEN 1 OF 1 COLLECTED 12/19/17): MALIGNANT CELLS CONSISTENT WITH ADENOCARCINOMA.    12/19/2017 Initial Diagnosis    Malignant neoplasm of body of pancreas ([place])    12/19/2017 Cancer Staging    Staging form: Exocrine Pancreas, AJCC 8th Edition - Clinical stage from 12/19/2017: Stage IIA (cT3, cN0, cM0) - Signed by Truitt Merle, MD on 01/13/2018    01/05/2018 Imaging    01/05/2018 MRI Abdomen IMPRESSION: 1. Poorly marginated hypoenhancing 4.2 x 2.5 x 5.2 cm pancreatic body mass with pancreatic tail atrophy and duct dilation, compatible with known pancreatic adenocarcinoma. Pancreatic tumor appears locally advanced with significant vascular involvement as detailed. No biliary ductal dilatation. 2. No abdominal adenopathy. 3. Two similar subcentimeter enhancing left liver lobe masses as detailed, which are too small to accurately characterize on this mildly motion degraded scan. Differential includes small metastases or hemangiomas. Suggest follow-up MRI abdomen without and with IV contrast in 3 months. These lesions are probably too small to characterize by PET-CT. 4.  Aortic Atherosclerosis (ICD10-I70.0).    01/06/2018 Imaging    01/06/2018 CT Chest IMPRESSION: 1. Two non-specific small left lower lobe pulmonary nodules. Otherwise, no evidence of metastatic disease within the chest. 2.  Aortic  Atherosclerosis (ICD10-I70.0).  Emphysema (ICD10-J43.9).    01/13/2018 Tumor Marker    Tumor Marker Results for Michael Maddox, Michael Maddox (MRN [id number]) as  of 01/13/2018 09:17  Ref. Range 12/30/2017 14:59  CA 19-9 Latest Ref Range: 0 - 35 U/mL 1,263 (H)      01/21/2018 -  Chemotherapy    FOLFIRINOX every 2 weeks starting 01/21/18. Increased to full dose oxaliplatin on cycle 2    02/03/2018 Genetic Testing    Negative genetic testing on the [facility] Multi-Cancer Panel + Brain Cancer Panel. The Multi-Cancer Panel offered by [facility] includes sequencing and/or deletion duplication testing of the following 84 genes: AIP, ALK, APC, ATM, AXIN2,BAP1,  BARD1, BLM, BMPR1A, BRCA1, BRCA2, BRIP1, CASR, CDC73, CDH1, CDK4, CDKN1B, CDKN1C, CDKN2A (p14ARF), CDKN2A (p16INK4a), CEBPA, CHEK2, CTNNA1, DICER1, DIS3L2, EGFR (c.2369C>T, p.Thr790Met variant only), EPCAM (Deletion/duplication testing only), FH, FLCN, GATA2, GPC3, GREM1 (Promoter region deletion/duplication testing only), HOXB13 (c.251G>A, p.Gly84Glu), HRAS, KIT, MAX, MEN1, MET, MITF (c.952G>A, p.Glu318Lys variant only), MLH1, MSH2, MSH3, MSH6, MUTYH, NBN, NF1, NF2, NTHL1, PALB2, PDGFRA, PHOX2B, PMS2, POLD1, POLE, POT1, PRKAR1A, PTCH1, PTEN, RAD50, RAD51C, RAD51D, RB1, RECQL4, RET, RUNX1, SDHAF2, SDHA (sequence changes only), SDHB, SDHC, SDHD, SMAD4, SMARCA4, SMARCB1, SMARCE1, STK11, SUFU, TERC, TERT, TMEM127, TP53, TSC1, TSC2, VHL, WRN and WT1.  The CNS/Brain Cancer Panel offered by [facility] includes sequencing and/or deletion duplication testing of the following 42 genes: ALK, APC, BAP1, BARD1, CDK4, CDKN2A, DICER1, EPCAM, EZH2, GPC3, HRAS, KIF1B, MEN1, MLH1, MSH2, MSH6, NF1, NF2, PHOX2B, PMS2, POT1, PRKAR1A, PTCH2, PTEN, RB1, SMARCA4, SMARCB1, SMARCE1, SUFU, TP53, TSC1, TSC2, and VHL.   The report date is 02/03/2018.     04/06/2018 Imaging    -CT AP W Constrast 04/06/18 IMPRESSION: 1. No significant change in large pancreatic body mass with pancreatic ductal obstruction. No biliary obstruction 2. No change in size of peripancreatic mass adjacent to uncinate consistent with nodal metastasis or direct tumor  extension. 3. Vascular involvement of the celiac trunk and SMA by the pancreatic cancer similar to prior. 4. Hypodense lesion along the gallbladder fossa not seen on comparison MRI is indeterminate. Consider follow-up MRI for further characterization. Two other hypodense lesions in the liver appear similar to comparison exam MRI. 5. Obstruction of the iliac arteries by atherosclerotic disease. Reconstitution of the femoral arteries by collateral flow.    05/18/2018 Procedure    he had a celiac block with Dr. Vernard Gambles on 05/18/2018.     CURRENT THERAPY  Supportive care    INTERVAL HISTORY: Michael Maddox is a 68 y.o. male who is here for follow-up. Today, he is here to be re-accessed and decide wether or not change his treatment to total single agent gemcitabine or undergo hospice care. He is here today with a family member is feeling very physically and emotionally distress today.His appetite is small but improving. He is eating about 4 small meals, and some snacks in between, 1 bottle of Glucerna, and about 40 oz of liquid. He uses a walker to ambulate but most of the day he is laying down in his bed. His abdominal pain has improved significantly since his celiac block. We discussed palliative care and hospice. He has had a mild produtive cough with greenish phlegm for about 2-3 weeks, specifically in the morning. Denies fevers of chills.   Pertinent positives and negatives of review of systems are listed and detailed within the above HPI.  REVIEW OF SYSTEMS:   Constitutional: Denies fevers, chills or abnormal weight loss, (+) fatigue, (+) emotionally distress, (+)  weight loss , (+) low appetite  Eyes: Denies blurriness of vision Ears, nose, mouth, throat, and face: Denies mucositis or sore throat Respiratory: Denies dyspnea or wheezes, (+) mild productive cough  Cardiovascular: Denies palpitation, chest discomfort or lower extremity swelling Gastrointestinal:  Denies nausea, heartburn  or change in bowel habits Skin: Denies abnormal skin rashes Lymphatics: Denies new lymphadenopathy or easy bruising Neurological:Denies numbness, tingling or new weaknesses Behavioral/Psych: Mood is stable, no new changes  All other systems were reviewed with the patient and are negative.  MEDICAL HISTORY:  Past Medical History:  Diagnosis Date  . Arthritis   . Carotid artery disease ([place]) followed by dr berry--- per last duplex [id number]  LICA 85-88%, bilateral ECA >50%,  patent RICA post intervention   hx acute high grade stenosis RICA >80% 02-22-2009 in setting of  stroke  s/p  right carotid endarterectomy 02-28-2009/    . Claudication of both lower extremities ([place])    due to PAD ----  right > left  . COPD (chronic obstructive pulmonary disease) ([place])   . Coronary artery disease CARDIOLOGIST-  DR BERRY   hx positive myoview 07-29-2013;  08-16-2013 per cardiac cath, severe 3V CAD w/ 60% subclavian stenosis;  08-23-2013   s/p  CABG x3  (LIMA to LAD, SVG to OM1, SVG to PDA)  . CVA (cerebral vascular accident) ([place]) 02/22/2009   acute infarct right hemisphere w/ severe RICA stentosis----  per pt no residuals  . Family history of brain cancer   . Family history of breast cancer   . GERD (gastroesophageal reflux disease)   . HTN (hypertension)   . Hyperlipidemia   . Hyperplasia of prostate with lower urinary tract symptoms (LUTS)   . Peripheral arterial disease ([place]) followed by dr berry--- last dobbler ABIs bilateral SFA occlusions (pt is scheduled for intervention 05-08-2017   hx PTA and stenting to bilateral SFA 2002 and PTA w/ stenting left common iliac artery and for in-stent restenosis 2003 Left SFA and 2005  stenting right SFA for in-stent restenosis  . Prostate cancer [facility]) urologist-  dr ottelin/  oncologist-  dr Tammi Klippel   dx 11-20-2016-- Stage T1c, Gleason 4+3, PSA 5.73, vol 35.95cc--- scheduled for radioactive seed implants 04-11-2017  . S/P CABG x 3 08/23/2013   LIMA to LAD,  SVG to OM1, SVG to PDA  . S/P peripheral artery angioplasty with stent placement    bilateral SFA 2002;  in-stenosis bilateral SFA  (right 2003, left 2005) and left CIA 2003  . Shortness of breath   . Type II diabetes mellitus ([place])   . Weak urinary stream   . Wears glasses     SURGICAL HISTORY: Past Surgical History:  Procedure Laterality Date  . ABDOMINAL AORTOGRAM N/A 05/08/2017   Procedure: ABDOMINAL AORTOGRAM;  Surgeon: Lorretta Harp, MD;  Location: [place] CV LAB;  Service: Cardiovascular;  Laterality: N/A;  . CARDIAC CATHETERIZATION  12-01-2000    dr berry    non-critial CAD & nonischemic cardiolite-- mLAD 50%, mLCx 70%, prox. to mid RCA 75% focal midsegment 80-85%  . CARDIOVASCULAR STRESS TEST  07-29-2013    dr berry   High risk nuclear study w/ inferoseptal ischemia/  normal LV function and wall motion,  nuclear study ef 52%  . CAROTID ENDARTERECTOMY Right 02-28-2009    dr c. Scot Dock  [facility]   patch angioplasty  . COLONOSCOPY    . CORONARY ARTERY BYPASS GRAFT N/A 08/23/2013   Procedure: CORONARY ARTERY BYPASS GRAFTING (CABG);  Surgeon: Remo Lipps  Chaya Jan, MD;  Location: [place];  Service: Open Heart Surgery;  Laterality: N/A;  Coronary Artery Bypass graft times three using left internal mammary artery and left leg saphenous vein   . CYSTOSCOPY N/A 04/11/2017   Procedure: CYSTOSCOPY FLEXIBLE;  Surgeon: Kathie Rhodes, MD;  Location: [facility];  Service: Urology;  Laterality: N/A;  no seeds found in bladder  . ENDOVASCULAR STENT INSERTION Bilateral right 12-25-2000 ;  left 03-02-2001   dr berry   PTA and stenting to bilateral SFA  . ENDOVASCULAR STENT INSERTION Bilateral right 08-30-2003 ;  left 11-10-2001    dr berry   PTA and stenting for in-stent restenosis right SFA 08-30-2003:  PTA and stenting distal left common iliac artery and in-stent restenosis left SFA 11-10-2001  . FINE NEEDLE ASPIRATION N/A 12/19/2017   Procedure: FINE NEEDLE ASPIRATION (FNA)  LINEAR;  Surgeon: Carol Ada, MD;  Location: [facility];  Service: Endoscopy;  Laterality: N/A;  . INTRAOPERATIVE TRANSESOPHAGEAL ECHOCARDIOGRAM N/A 08/23/2013   Procedure: INTRAOPERATIVE TRANSESOPHAGEAL ECHOCARDIOGRAM;  Surgeon: Melrose Nakayama, MD;  Location: [place];  Service: Open Heart Surgery;  Laterality: N/A;  . IR IMAGING GUIDED PORT INSERTION  01/20/2018  . IR RADIOLOGIST EVAL & MGMT  05/06/2018  . LEFT HEART CATHETERIZATION WITH CORONARY ANGIOGRAM N/A 08/16/2013   Procedure: LEFT HEART CATHETERIZATION WITH CORONARY ANGIOGRAM;  Surgeon: Lorretta Harp, MD;  Location: [facility] CATH LAB;  Service: Cardiovascular;  Laterality: N/A;   severe 3V CAD and 60% subclavian sternosis  . LOWER EXTREMITY ANGIOGRAM N/A 08/16/2013   Procedure: LOWER EXTREMITY ANGIOGRAM;  Surgeon: Lorretta Harp, MD;  Location: [facility] CATH LAB;  Service: Cardiovascular;  Laterality: N/A;  . LOWER EXTREMITY ANGIOGRAPHY Bilateral 05/08/2017   Procedure: Lower Extremity Angiography;  Surgeon: Lorretta Harp, MD;  Location: [place] CV LAB;  Service: Cardiovascular;  Laterality: Bilateral;  . PERIPHERAL VASCULAR BALLOON ANGIOPLASTY Left 05/08/2017   Procedure: PERIPHERAL VASCULAR BALLOON ANGIOPLASTY;  Surgeon: Lorretta Harp, MD;  Location: [place] CV LAB;  Service: Cardiovascular;  Laterality: Left;  COMMON ILIAC  . RADIOACTIVE SEED IMPLANT N/A 04/11/2017   Procedure: RADIOACTIVE SEED IMPLANT/BRACHYTHERAPY IMPLANT;  Surgeon: Kathie Rhodes, MD;  Location: [facility];  Service: Urology;  Laterality: N/A;    67   seeds implanted  . SPACE OAR INSTILLATION N/A 04/11/2017   Procedure: SPACE OAR INSTILLATION;  Surgeon: Kathie Rhodes, MD;  Location: [facility];  Service: Urology;  Laterality: N/A;  . TRANSTHORACIC ECHOCARDIOGRAM  07/15/2013   ef 16-10%, grade 1 diastolic dysfunction/  trivial MR, mild to moderate PR, mild TR   . UPPER ESOPHAGEAL ENDOSCOPIC ULTRASOUND (EUS) N/A 12/19/2017    Procedure: UPPER ESOPHAGEAL ENDOSCOPIC ULTRASOUND (EUS);  Surgeon: Carol Ada, MD;  Location: [facility];  Service: Endoscopy;  Laterality: N/A;    I have reviewed the social history and family history with the patient and they are unchanged from previous note.  ALLERGIES:  has No Known Allergies.  MEDICATIONS:  Current Outpatient Medications  Medication Sig Dispense Refill  . ACCU-CHEK GUIDE test strip USE 1 THREE TIMES DAILY  5  . aspirin 81 MG chewable tablet Chew 1 tablet (81 mg total) by mouth daily.    . chlorproMAZINE (THORAZINE) 25 MG tablet Take 1 tablet (25 mg total) by mouth 3 (three) times daily as needed for hiccoughs. 20 tablet 0  . cilostazol (PLETAL) 50 MG tablet Take 1 tablet (50 mg total) by mouth 2 (two) times daily. 60 tablet 6  .  clopidogrel (PLAVIX) 75 MG tablet Take 75 mg by mouth at bedtime.     Marland Kitchen dexamethasone (DECADRON) 4 MG tablet Take 1 tab daily with breakfast on day 3-5 after chemo 30 tablet 1  . dexlansoprazole (DEXILANT) 60 MG capsule Take 60 mg by mouth daily as needed (for reflux.).     Marland Kitchen doxycycline (VIBRAMYCIN) 100 MG capsule Take 100 mg by mouth daily as needed for itching.  1  . dronabinol (MARINOL) 2.5 MG capsule Take 1 capsule (2.5 mg total) by mouth 2 (two) times daily before a meal. 60 capsule 0  . hydrocortisone (ANUSOL-HC) 2.5 % rectal cream Place 1 application rectally 2 (two) times daily as needed for hemorrhoids. 30 g 1  . hydrocortisone 2.5 % ointment Apply 1 application topically 2 (two) times daily as needed (for skin irritation).   0  . lactulose (CHRONULAC) 10 GM/15ML solution TAKE 30 MLS (TWO TABLESPOONSFUL) BY MOUTH EVERY TWO HOURS AS NEEDED UNTIL BOWEL MOVEMENT 473 mL 1  . lidocaine-prilocaine (EMLA) cream Apply 1 application topically as needed (local anesthesia).    . LORazepam (ATIVAN) 1 MG tablet Take 1 tablet (1 mg total) by mouth at bedtime. 5 tablet 0  . metFORMIN (GLUCOPHAGE) 500 MG tablet Take 500 mg by mouth at bedtime.   2   . metoprolol succinate (TOPROL-XL) 25 MG 24 hr tablet Take 25 mg by mouth at bedtime.     . metoprolol tartrate (LOPRESSOR) 25 MG tablet Take 0.5 tablets (12.5 mg total) by mouth daily. 30 tablet 0  . mirtazapine (REMERON) 15 MG tablet Take 1 tablet (15 mg total) by mouth at bedtime. 30 tablet 3  . morphine (MS CONTIN) 15 MG 12 hr tablet Take 1 tablet (15 mg total) by mouth every 8 (eight) hours. 90 tablet 0  . Multiple Vitamins-Minerals (MULTIVITAMIN WITH MINERALS) tablet Take 1 tablet by mouth daily.    Marland Kitchen oxyCODONE (OXY IR/ROXICODONE) 5 MG immediate release tablet Take 1 tablet (5 mg total) by mouth every 6 (six) hours as needed for severe pain. 90 tablet 0  . simvastatin (ZOCOR) 20 MG tablet Take 20 mg by mouth at bedtime.     . tamsulosin (FLOMAX) 0.4 MG CAPS capsule Take 0.4 mg by mouth every evening.  11  . megestrol (MEGACE ES) 625 MG/5ML suspension Take 5 mLs (625 mg total) by mouth daily. (Patient not taking: Reported on 06/18/2018) 150 mL 0   No current facility-administered medications for this visit.    Facility-Administered Medications Ordered in Other Visits  Medication Dose Route Frequency Provider Last Rate Last Dose  . sodium chloride flush (NS) 0.9 % injection 10 mL  10 mL Intracatheter Once Truitt Merle, MD        PHYSICAL EXAMINATION: ECOG PERFORMANCE STATUS: 4 - Bedbound  Vitals:   06/18/18 0848  BP: 123/75  Pulse: 96  Resp: 16  SpO2: 100%   Filed Weights   06/18/18 0848  Weight: 96 lb 14.4 oz (44 kg)    GENERAL:alert, (+) on a wheelchair, cachectic, holding her head down most the time during the visit  SKIN: skin color, texture, turgor are normal, no rashes or significant lesions EYES: normal, Conjunctiva are pink and non-injected, sclera clear OROPHARYNX:no exudate, no erythema and lips, buccal mucosa, and tongue normal  NECK: supple, thyroid normal size, non-tender, without nodularity LYMPH:  no palpable lymphadenopathy in the cervical, axillary or  inguinal LUNGS: clear to auscultation and percussion with normal breathing effort HEART: regular rate & rhythm and no  murmurs and no lower extremity edema ABDOMEN:abdomen soft, non-tender and normal bowel sounds Musculoskeletal:no cyanosis of digits and no clubbing  NEURO: alert & oriented x 3 with fluent speech, no focal motor/sensory deficits  LABORATORY DATA:  I have reviewed the data as listed CBC Latest Ref Rng & Units 06/18/2018 06/04/2018 05/18/2018  WBC 4.0 - 10.5 K/uL 3.9(L) 5.0 5.3  Hemoglobin 13.0 - 17.0 g/dL 10.8(L) 10.6(L) 13.7  Hematocrit 39.0 - 52.0 % 32.9(L) 32.1(L) 42.4  Platelets 150 - 400 K/uL 256 237 284     CMP Latest Ref Rng & Units 06/18/2018 06/04/2018 05/14/2018  Glucose 70 - 99 mg/dL 55(L) 114(H) 161(H)  BUN 8 - 23 mg/dL '16 17 10  '$ Creatinine 0.61 - 1.24 mg/dL 0.70 0.81 0.74  Sodium 135 - 145 mmol/L 142 137 138  Potassium 3.5 - 5.1 mmol/L 3.7 3.8 4.4  Chloride 98 - 111 mmol/L 104 99 101  CO2 22 - 32 mmol/L '28 26 26  '$ Calcium 8.9 - 10.3 mg/dL 9.6 9.3 9.6  Total Protein 6.5 - 8.1 g/dL 7.1 6.5 7.0  Total Bilirubin 0.3 - 1.2 mg/dL 0.5 0.6 0.4  Alkaline Phos 38 - 126 U/L 72 70 98  AST 15 - 41 U/L '22 24 29  '$ ALT 0 - 44 U/L 19 18 35      RADIOGRAPHIC STUDIES: I have personally reviewed the radiological images as listed and agreed with the findings in the report. No results found.   ASSESSMENT & PLAN:  Michael Maddox is a 68 y.o. male with history of  1. Adenocarcinoma of body of pancreas, cT3N0Mx, unresectable, with indeterminate lung nodes and liver lesions, MSIunknown (insufficient tissue), BRCA mutation (-) -Diagnosed in 11/2017. Genetics negative. Treated with chemo. He was onFirst-line FOLFIRINOX every 2 weekswith dose reduction,started in10/2/19 and stopped 06/04/2018. Toleratingmoderatelywell with dose reduction. -His tumor is unresectable, he also has a questionable line in the liver metastasis, sonot curable at this stage. The goal of therapy is  palliative to prolong his life - He had a celiac block procedure on 05/18/2018 for worsening abdominal painand his pain has improved.  -His performance status has deteriorated rapidly in the past 2 to 3 weeks, since his last dose chemotherapy on 04/30/2018.  He is pretty much bedbound, has very little appetite, cachectic.  -I do not think he is a candidate for more chemotherapy due to his poor performance status. -We discussed palliative care and hospice.  I encouraged him to consider hospice, he appears to be reluctant, but agrees to think about it and will discuss with his family members. -I will give him 1 L normal saline today for hydration. -We reviewed symptom management today.   2. Constipation and abdominal pain -Uses Miralax, Lactulose, and Senakot-S as needed. -Uses MS Contin '15mg'$  twice a day and oxycodone as needed  -He had a celiac block with Dr. Vernard Gambles on 05/18/2018.  His pain has much improved and well controlled since her procedure.  3. Low appetite, weight loss, Nausea, generalized weakness -Secondary to chemo and malignancy. -Currently on zofran, compazine, and dexamethasone. - f/u with dietician. - He previously tried mirtazapine and did not tolerate well. -I called in Megace for him, unfortunately he has very high co-pay and was not able to afford it -I encouraged him to apply for grant and our cancer center, to pay his medication.  4. Depression and Anxiety -Currently on Ativanas needed.  He tried mirtazapine, did not tolerate well.  5. Adenocarcinoma of the prostate stage T1c,s/p radioactive  seed implant per Dr. Tammi Klippel on 04/11/17 -f/u with Dr. Karsten Ro  6.HTN, HL,DM -f/u with PCP and continue medications  7. TIA in 2010 s/p endarterectomy, CAD s/p CABG in 2015, PVD -s/prevascularization and stenting in the LEs -Currently on Plavix and aspirin.  - f/u with cardiologist Dr. [name]  8. Goal of care discussion  -We again discussed the incurable nature  of his cancer, and the overall poor prognosis, especially he is not a candidate for further anticancer treatment, and rapid declining of his performance status. -The patient understands the goal of care is palliative. -I recommend DNR/DNI, he will think about it    Plan  - I refilled Thorazine 25 mg to be taken as needed  - He will contact me if he will proceed with Hospice care  - Pt's wife will talk to Financial Services  -No more chemo  - He will receive IV fluids today  - F/u open, will call him next week for f/u     No problem-specific Assessment & Plan notes found for this encounter.   No orders of the defined types were placed in this encounter.  All questions were answered. The patient knows to call the clinic with any problems, questions or concerns. No barriers to learning was detected. I spent 20 minutes counseling the patient face to face. The total time spent in the appointment was 25 minutes and more than 50% was on counseling and review of test results  I, Manson Allan am acting as scribe for Dr. Truitt Merle.  I have reviewed the above documentation for accuracy and completeness, and I agree with the above.     Truitt Merle, MD 06/18/2018

## 2018-06-18 ENCOUNTER — Inpatient Hospital Stay: Payer: Medicare Other

## 2018-06-18 ENCOUNTER — Encounter: Payer: Self-pay | Admitting: *Deleted

## 2018-06-18 ENCOUNTER — Encounter: Payer: Self-pay | Admitting: Hematology

## 2018-06-18 ENCOUNTER — Telehealth: Payer: Self-pay | Admitting: Hematology

## 2018-06-18 ENCOUNTER — Inpatient Hospital Stay (HOSPITAL_BASED_OUTPATIENT_CLINIC_OR_DEPARTMENT_OTHER): Payer: Medicare Other | Admitting: Hematology

## 2018-06-18 VITALS — BP 123/75 | HR 96 | Resp 16 | Ht 71.0 in | Wt 96.9 lb

## 2018-06-18 DIAGNOSIS — C251 Malignant neoplasm of body of pancreas: Secondary | ICD-10-CM

## 2018-06-18 DIAGNOSIS — C787 Secondary malignant neoplasm of liver and intrahepatic bile duct: Secondary | ICD-10-CM | POA: Diagnosis not present

## 2018-06-18 DIAGNOSIS — Z95828 Presence of other vascular implants and grafts: Secondary | ICD-10-CM

## 2018-06-18 DIAGNOSIS — I10 Essential (primary) hypertension: Secondary | ICD-10-CM

## 2018-06-18 DIAGNOSIS — I959 Hypotension, unspecified: Secondary | ICD-10-CM | POA: Diagnosis not present

## 2018-06-18 DIAGNOSIS — C61 Malignant neoplasm of prostate: Secondary | ICD-10-CM | POA: Diagnosis not present

## 2018-06-18 DIAGNOSIS — E119 Type 2 diabetes mellitus without complications: Secondary | ICD-10-CM | POA: Diagnosis not present

## 2018-06-18 DIAGNOSIS — K5900 Constipation, unspecified: Secondary | ICD-10-CM

## 2018-06-18 DIAGNOSIS — J449 Chronic obstructive pulmonary disease, unspecified: Secondary | ICD-10-CM

## 2018-06-18 DIAGNOSIS — Z79899 Other long term (current) drug therapy: Secondary | ICD-10-CM | POA: Diagnosis not present

## 2018-06-18 LAB — CBC WITH DIFFERENTIAL (CANCER CENTER ONLY)
Abs Immature Granulocytes: 0.01 10*3/uL (ref 0.00–0.07)
BASOS ABS: 0 10*3/uL (ref 0.0–0.1)
Basophils Relative: 0 %
Eosinophils Absolute: 0 10*3/uL (ref 0.0–0.5)
Eosinophils Relative: 1 %
HCT: 32.9 % — ABNORMAL LOW (ref 39.0–52.0)
Hemoglobin: 10.8 g/dL — ABNORMAL LOW (ref 13.0–17.0)
Immature Granulocytes: 0 %
Lymphocytes Relative: 24 %
Lymphs Abs: 0.9 10*3/uL (ref 0.7–4.0)
MCH: 30.2 pg (ref 26.0–34.0)
MCHC: 32.8 g/dL (ref 30.0–36.0)
MCV: 91.9 fL (ref 80.0–100.0)
Monocytes Absolute: 0.3 10*3/uL (ref 0.1–1.0)
Monocytes Relative: 9 %
NEUTROS PCT: 66 %
NRBC: 0 % (ref 0.0–0.2)
Neutro Abs: 2.6 10*3/uL (ref 1.7–7.7)
PLATELETS: 256 10*3/uL (ref 150–400)
RBC: 3.58 MIL/uL — ABNORMAL LOW (ref 4.22–5.81)
RDW: 12.9 % (ref 11.5–15.5)
WBC Count: 3.9 10*3/uL — ABNORMAL LOW (ref 4.0–10.5)

## 2018-06-18 LAB — CMP (CANCER CENTER ONLY)
ALT: 19 U/L (ref 0–44)
AST: 22 U/L (ref 15–41)
Albumin: 3.6 g/dL (ref 3.5–5.0)
Alkaline Phosphatase: 72 U/L (ref 38–126)
Anion gap: 10 (ref 5–15)
BUN: 16 mg/dL (ref 8–23)
CO2: 28 mmol/L (ref 22–32)
Calcium: 9.6 mg/dL (ref 8.9–10.3)
Chloride: 104 mmol/L (ref 98–111)
Creatinine: 0.7 mg/dL (ref 0.61–1.24)
Glucose, Bld: 55 mg/dL — ABNORMAL LOW (ref 70–99)
Potassium: 3.7 mmol/L (ref 3.5–5.1)
Sodium: 142 mmol/L (ref 135–145)
Total Bilirubin: 0.5 mg/dL (ref 0.3–1.2)
Total Protein: 7.1 g/dL (ref 6.5–8.1)

## 2018-06-18 MED ORDER — CHLORPROMAZINE HCL 25 MG PO TABS: 25.0000 mg | ORAL_TABLET | Freq: Three times a day (TID) | ORAL | 0 refills | Status: AC | PRN

## 2018-06-18 MED ORDER — HEPARIN SOD (PORK) LOCK FLUSH 100 UNIT/ML IV SOLN
500.0000 [IU] | Freq: Once | INTRAVENOUS | Status: AC | PRN
  Administered 2018-06-18: 500 [IU]
  Filled 2018-06-18: qty 5

## 2018-06-18 MED ORDER — SODIUM CHLORIDE 0.9 % IV SOLN
INTRAVENOUS | Status: AC
  Filled 2018-06-18: qty 250

## 2018-06-18 MED ORDER — SODIUM CHLORIDE 0.9 % IV SOLN
Freq: Once | INTRAVENOUS | Status: AC
  Administered 2018-06-18: 10:00:00 via INTRAVENOUS
  Filled 2018-06-18: qty 250

## 2018-06-18 MED ORDER — SODIUM CHLORIDE 0.9% FLUSH
10.0000 mL | Freq: Once | INTRAVENOUS | Status: AC
  Administered 2018-06-18: 10 mL
  Filled 2018-06-18: qty 10

## 2018-06-18 MED ORDER — SODIUM CHLORIDE 0.9% FLUSH
10.0000 mL | INTRAVENOUS | Status: DC | PRN
  Administered 2018-06-18: 10 mL
  Filled 2018-06-18: qty 10

## 2018-06-18 NOTE — Patient Instructions (Signed)
Dehydration, Adult  Dehydration is when there is not enough fluid or water in your body. This happens when you lose more fluids than you take in. Dehydration can range from mild to very bad. It should be treated right away to keep it from getting very bad. Symptoms of mild dehydration may include:  Thirst.  Dry lips.  Slightly dry mouth.  Dry, warm skin.  Dizziness. Symptoms of moderate dehydration may include:  Very dry mouth.  Muscle cramps.  Dark pee (urine). Pee may be the color of tea.  Your body making less pee.  Your eyes making fewer tears.  Heartbeat that is uneven or faster than normal (palpitations).  Headache.  Light-headedness, especially when you stand up from sitting.  Fainting (syncope). Symptoms of very bad dehydration may include:  Changes in skin, such as: ? Cold and clammy skin. ? Blotchy (mottled) or pale skin. ? Skin that does not quickly return to normal after being lightly pinched and let go (poor skin turgor).  Changes in body fluids, such as: ? Feeling very thirsty. ? Your eyes making fewer tears. ? Not sweating when body temperature is high, such as in hot weather. ? Your body making very little pee.  Changes in vital signs, such as: ? Weak pulse. ? Pulse that is more than 100 beats a minute when you are sitting still. ? Fast breathing. ? Low blood pressure.  Other changes, such as: ? Sunken eyes. ? Cold hands and feet. ? Confusion. ? Lack of energy (lethargy). ? Trouble waking up from sleep. ? Short-term weight loss. ? Unconsciousness. Follow these instructions at home:   If told by your doctor, drink an ORS: ? Make an ORS by using instructions on the package. ? Start by drinking small amounts, about  cup (120 mL) every 5-10 minutes. ? Slowly drink more until you have had the amount that your doctor said to have.  Drink enough clear fluid to keep your pee clear or pale yellow. If you were told to drink an ORS, finish the  ORS first, then start slowly drinking clear fluids. Drink fluids such as: ? Water. Do not drink only water by itself. Doing that can make the salt (sodium) level in your body get too low (hyponatremia). ? Ice chips. ? Fruit juice that you have added water to (diluted). ? Low-calorie sports drinks.  Avoid: ? Alcohol. ? Drinks that have a lot of sugar. These include high-calorie sports drinks, fruit juice that does not have water added, and soda. ? Caffeine. ? Foods that are greasy or have a lot of fat or sugar.  Take over-the-counter and prescription medicines only as told by your doctor.  Do not take salt tablets. Doing that can make the salt level in your body get too high (hypernatremia).  Eat foods that have minerals (electrolytes). Examples include bananas, oranges, potatoes, tomatoes, and spinach.  Keep all follow-up visits as told by your doctor. This is important. Contact a doctor if:  You have belly (abdominal) pain that: ? Gets worse. ? Stays in one area (localizes).  You have a rash.  You have a stiff neck.  You get angry or annoyed more easily than normal (irritability).  You are more sleepy than normal.  You have a harder time waking up than normal.  You feel: ? Weak. ? Dizzy. ? Very thirsty.  You have peed (urinated) only a small amount of very dark pee during 6-8 hours. Get help right away if:  You have   symptoms of very bad dehydration.  You cannot drink fluids without throwing up (vomiting).  Your symptoms get worse with treatment.  You have a fever.  You have a very bad headache.  You are throwing up or having watery poop (diarrhea) and it: ? Gets worse. ? Does not go away.  You have blood or something green (bile) in your throw-up.  You have blood in your poop (stool). This may cause poop to look black and tarry.  You have not peed in 6-8 hours.  You pass out (faint).  Your heart rate when you are sitting still is more than 100 beats a  minute.  You have trouble breathing. This information is not intended to replace advice given to you by your health care provider. Make sure you discuss any questions you have with your health care provider. Document Released: 02/02/2009 Document Revised: 10/27/2015 Document Reviewed: 06/02/2015 Elsevier Interactive Patient Education  2019 [employer]vier Inc.  

## 2018-06-18 NOTE — Telephone Encounter (Signed)
No los per 2/27.

## 2018-06-18 NOTE — Progress Notes (Signed)
Patient's spouse came in to inquire about financial assistance. Reviewed my notes and asked if income has changed. She states it has. Gave her the income guidelines for a household of 2. She states she will locate proof of income and inquire further if they qualify.  Gave her my card for any additional financial questions or concerns.

## 2018-06-19 LAB — CANCER ANTIGEN 19-9: CAN 19-9: 16188 U/mL — AB (ref 0–35)

## 2018-06-23 ENCOUNTER — Encounter: Payer: Self-pay | Admitting: *Deleted

## 2018-06-29 DIAGNOSIS — E1165 Type 2 diabetes mellitus with hyperglycemia: Secondary | ICD-10-CM | POA: Diagnosis not present

## 2018-07-03 ENCOUNTER — Other Ambulatory Visit: Payer: Self-pay | Admitting: Hematology

## 2018-07-03 MED ORDER — OXYCODONE HCL 5 MG PO TABS: 5.0000 mg | ORAL_TABLET | Freq: Four times a day (QID) | ORAL | 0 refills | Status: DC | PRN

## 2018-07-14 ENCOUNTER — Telehealth: Payer: Self-pay | Admitting: *Deleted

## 2018-07-14 ENCOUNTER — Telehealth: Payer: Self-pay

## 2018-07-14 NOTE — Telephone Encounter (Signed)
Received phone call from [facility] with [facility]. Dr. Burr Medico is in agreement with resuming Palliative Care.

## 2018-07-14 NOTE — Telephone Encounter (Signed)
Phone call placed to patient's daughter, Caryl Pina, to offer to schedule visit with Palliative Care. Provided education regarding hospice vs palliative care. Visit scheduled for 07/15/2018

## 2018-07-14 NOTE — Telephone Encounter (Signed)
Received a message from [facility] with Palliative Care that daughter had called requesting visit. Message sent to Dr. Ernestina Penna nurse to confirm MD's agreement for Palliative Care.

## 2018-07-14 NOTE — Telephone Encounter (Signed)
Received TC from Maxwell Caul, RN with [place] requesting permission to have pt re-evaluated by Palliative NP this week.  Apparently the patient and his wife were not totally on board with Palliative Care at the last visit. Since then the patient's children have requested another visit. Informed Inez Catalina that it perfect;ly ok to go back out there and assess pt and his overall situation. Pt has no follow up visits scheduled here at this time.  Inez Catalina states that someone will go out there Thursday or Friday of this week.

## 2018-07-15 ENCOUNTER — Other Ambulatory Visit: Payer: Medicare Other | Admitting: Adult Health Nurse Practitioner

## 2018-07-15 ENCOUNTER — Telehealth: Payer: Self-pay | Admitting: Adult Health Nurse Practitioner

## 2018-07-15 ENCOUNTER — Other Ambulatory Visit: Payer: Self-pay

## 2018-07-15 DIAGNOSIS — Z515 Encounter for palliative care: Secondary | ICD-10-CM

## 2018-07-15 NOTE — Telephone Encounter (Signed)
COVID 19 screening prior to visit.  Screening was negative; will see patient this afternoon. Raychel Dowler K. Olena Heckle NP

## 2018-07-15 NOTE — Progress Notes (Signed)
[facility] Consult Note Telephone: [phone]  Fax: [phone]  PATIENT NAME: Michael Maddox DOB: 1951/01/03 MRN: [id number]  PRIMARY CARE PROVIDER:   Nolene Ebbs, MD  REFERRING PROVIDER:  Dr. Truitt Maddox  RESPONSIBLE PARTY:   Wife, Michael Maddox  [phone]     RECOMMENDATIONS and PLAN:  1. Pancreatic cancer.  Patient has undergone chemotherapy and due to increased weakness and anorexia is no longer a candidate for any further cancer treatment.  Patient still has port a cath in place from chemo infusions.  Last chemo treatment was 06/18/2018  2. Pain. Last month patient had radiofrequency done to deaden nerve endings.  States that this has helped with pain and he doesn't have to take as much pain medication on some days.  Patient has morphine sulfate 15 mg Q 8 hrs PRN and oxycodone 5 mg Q 6 hrs PRN.    3. Cachexia.  Patient currently weighs 96 pounds with a BMI of 13.51.  Wife states that he does not eat much but will eat with encouragement.  States that he does try to eat a little something every couple of hours.  Will drink 1-2 Boost drinks a day.  4.  Goals of care.  Dr. Burr Maddox recommended hospice services at last visit.  Patient was uncertain about hospice but willing to talk with family and consider it. Discussed at length hospice services and what to expect at [facility].  Family wants to proceed with hospice referral.  Called Dr. Ernestina Maddox office and left message about proceeding with hospice referral.  I spent 60 minutes providing this consultation,  from 3:30 to 4:30. More than 50% of the time in this consultation was spent coordinating communication.   HISTORY OF PRESENT ILLNESS:  Michael Maddox is a 68 y.o. year old male with multiple medical problems including pancreatic cancer, BPH, HTN, CAD, COPD. Palliative Care was asked to help address goals of care.   CODE STATUS:   PPS: 40% HOSPICE ELIGIBILITY/DIAGNOSIS: yes/pancreatic  cancer unable to go through anymore cancer treatment  PHYSICAL EXAM:   General: NAD, frail appearing, thin Extremities: no edema, no joint deformities Skin: no rashes Neurological: Weakness but otherwise nonfocal  PAST MEDICAL HISTORY:  Past Medical History:  Diagnosis Date  . Arthritis   . Carotid artery disease ([place]) followed by dr berry--- per last duplex [id number]  LICA 09-73%, bilateral ECA >50%,  patent RICA post intervention   hx acute high grade stenosis RICA >80% 02-22-2009 in setting of  stroke  s/p  right carotid endarterectomy 02-28-2009/    . Claudication of both lower extremities ([place])    due to PAD ----  right > left  . COPD (chronic obstructive pulmonary disease) ([place])   . Coronary artery disease CARDIOLOGIST-  DR BERRY   hx positive myoview 07-29-2013;  08-16-2013 per cardiac cath, severe 3V CAD w/ 60% subclavian stenosis;  08-23-2013   s/p  CABG x3  (LIMA to LAD, SVG to OM1, SVG to PDA)  . CVA (cerebral vascular accident) ([place]) 02/22/2009   acute infarct right hemisphere w/ severe RICA stentosis----  per pt no residuals  . Family history of brain cancer   . Family history of breast cancer   . GERD (gastroesophageal reflux disease)   . HTN (hypertension)   . Hyperlipidemia   . Hyperplasia of prostate with lower urinary tract symptoms (LUTS)   . Peripheral arterial disease ([place]) followed by dr berry--- last dobbler ABIs bilateral SFA occlusions (pt  is scheduled for intervention 05-08-2017   hx PTA and stenting to bilateral SFA 2002 and PTA w/ stenting left common iliac artery and for in-stent restenosis 2003 Left SFA and 2005  stenting right SFA for in-stent restenosis  . Prostate cancer [facility]) urologist-  dr ottelin/  oncologist-  dr Tammi Klippel   dx 11-20-2016-- Stage T1c, Gleason 4+3, PSA 5.73, vol 35.95cc--- scheduled for radioactive seed implants 04-11-2017  . 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 S/P peripheral artery angioplasty with  stent placement    bilateral SFA 2002;  in-stenosis bilateral SFA  (right 2003, left 2005) and left CIA 2003  . Shortness of breath   . Type II diabetes mellitus ([place])   . Weak urinary stream   . Wears glasses     SOCIAL HX:  Social History   Tobacco Use  . Smoking status: Former Smoker    Packs/day: 1.00    Years: 35.00    Pack years: 35.00    Types: Cigarettes    Last attempt to quit: 05/04/2013    Years since quitting: 5.2  . Smokeless tobacco: Never Used  Substance Use Topics  . Alcohol use: Yes    Alcohol/week: 4.0 standard drinks    Types: 4 Standard drinks or equivalent per week    Comment: none in 2 months, previously 1-2 drinks per week     ALLERGIES: No Known Allergies   PERTINENT MEDICATIONS:  Outpatient Encounter Medications as of 07/15/2018  Medication Sig  . ACCU-CHEK GUIDE test strip USE 1 THREE TIMES DAILY  . aspirin 81 MG chewable tablet Chew 1 tablet (81 mg total) by mouth daily.  . chlorproMAZINE (THORAZINE) 25 MG tablet Take 1 tablet (25 mg total) by mouth 3 (three) times daily as needed for hiccoughs.  . cilostazol (PLETAL) 50 MG tablet Take 1 tablet (50 mg total) by mouth 2 (two) times daily.  . clopidogrel (PLAVIX) 75 MG tablet Take 75 mg by mouth at bedtime.   Marland Kitchen dexamethasone (DECADRON) 4 MG tablet Take 1 tab daily with breakfast on day 3-5 after chemo  . dexlansoprazole (DEXILANT) 60 MG capsule Take 60 mg by mouth daily as needed (for reflux.).   Marland Kitchen doxycycline (VIBRAMYCIN) 100 MG capsule Take 100 mg by mouth daily as needed for itching.  . dronabinol (MARINOL) 2.5 MG capsule Take 1 capsule (2.5 mg total) by mouth 2 (two) times daily before a meal.  . hydrocortisone (ANUSOL-HC) 2.5 % rectal cream Place 1 application rectally 2 (two) times daily as needed for hemorrhoids.  . hydrocortisone 2.5 % ointment Apply 1 application topically 2 (two) times daily as needed (for skin irritation).   Marland Kitchen lactulose (CHRONULAC) 10 GM/15ML solution TAKE 30 MLS (TWO  TABLESPOONSFUL) BY MOUTH EVERY TWO HOURS AS NEEDED UNTIL BOWEL MOVEMENT  . lidocaine-prilocaine (EMLA) cream Apply 1 application topically as needed (local anesthesia).  . LORazepam (ATIVAN) 1 MG tablet Take 1 tablet (1 mg total) by mouth at bedtime.  . megestrol (MEGACE ES) 625 MG/5ML suspension Take 5 mLs (625 mg total) by mouth daily. (Patient not taking: Reported on 06/18/2018)  . metFORMIN (GLUCOPHAGE) 500 MG tablet Take 500 mg by mouth at bedtime.   . metoprolol succinate (TOPROL-XL) 25 MG 24 hr tablet Take 25 mg by mouth at bedtime.   . metoprolol tartrate (LOPRESSOR) 25 MG tablet Take 0.5 tablets (12.5 mg total) by mouth daily.  . mirtazapine (REMERON) 15 MG tablet Take 1 tablet (  15 mg total) by mouth at bedtime.  Marland Kitchen morphine (MS CONTIN) 15 MG 12 hr tablet Take 1 tablet (15 mg total) by mouth every 8 (eight) hours.  . Multiple Vitamins-Minerals (MULTIVITAMIN WITH MINERALS) tablet Take 1 tablet by mouth daily.  Marland Kitchen oxyCODONE (OXY IR/ROXICODONE) 5 MG immediate release tablet Take 1 tablet (5 mg total) by mouth every 6 (six) hours as needed for severe pain.  . simvastatin (ZOCOR) 20 MG tablet Take 20 mg by mouth at bedtime.   . tamsulosin (FLOMAX) 0.4 MG CAPS capsule Take 0.4 mg by mouth every evening.  . [DISCONTINUED] prochlorperazine (COMPAZINE) 10 MG tablet Take 1 tablet (10 mg total) by mouth every 6 (six) hours as needed (NAUSEA).   Facility-Administered Encounter Medications as of 07/15/2018  Medication  . sodium chloride flush (NS) 0.9 % injection 10 mL      Michael Maddox Jenetta Downer, NP

## 2018-07-16 ENCOUNTER — Other Ambulatory Visit: Payer: Self-pay

## 2018-07-16 DIAGNOSIS — C251 Malignant neoplasm of body of pancreas: Secondary | ICD-10-CM

## 2018-07-16 NOTE — Progress Notes (Signed)
Referral made for Hospice services, sent over demographics and last OV note to [facility].

## 2018-07-20 ENCOUNTER — Telehealth: Payer: Self-pay

## 2018-07-20 NOTE — Telephone Encounter (Signed)
Faxed signed orders back to Hospice, sent to [facility] for scanning to chart.  

## 2018-08-03 ENCOUNTER — Other Ambulatory Visit: Payer: Self-pay | Admitting: Hematology

## 2018-08-03 ENCOUNTER — Telehealth: Payer: Self-pay

## 2018-08-03 MED ORDER — MORPHINE SULFATE ER 15 MG PO TBCR: 15.0000 mg | EXTENDED_RELEASE_TABLET | Freq: Three times a day (TID) | ORAL | 0 refills | Status: AC

## 2018-08-03 MED ORDER — OXYCODONE HCL 5 MG PO TABS: 5.0000 mg | ORAL_TABLET | Freq: Four times a day (QID) | ORAL | 0 refills | Status: AC | PRN

## 2018-08-03 NOTE — Telephone Encounter (Signed)
Hospice RN calls requesting refills for MS Contin and Oxycodone be sent into [facility], message was given to Dr. Burr Medico

## 2018-08-06 ENCOUNTER — Telehealth: Payer: Self-pay

## 2018-08-06 NOTE — Telephone Encounter (Signed)
Faxed signed order back to Hospice at [phone] sent to HIM for scan to chart.

## 2018-08-21 DEATH — deceased

## 2018-09-03 ENCOUNTER — Encounter: Payer: Self-pay | Admitting: Hematology

## 2019-01-13 IMAGING — CR DG CHEST 2V
4 series · 4 of 4 positions shown · non-contrast
Comparison: CT chest 01/05/2018, 03/07/2017

CLINICAL DATA: Hiccups

EXAM:
CHEST - 2 VIEW

[w chest lat (1 of 2)]
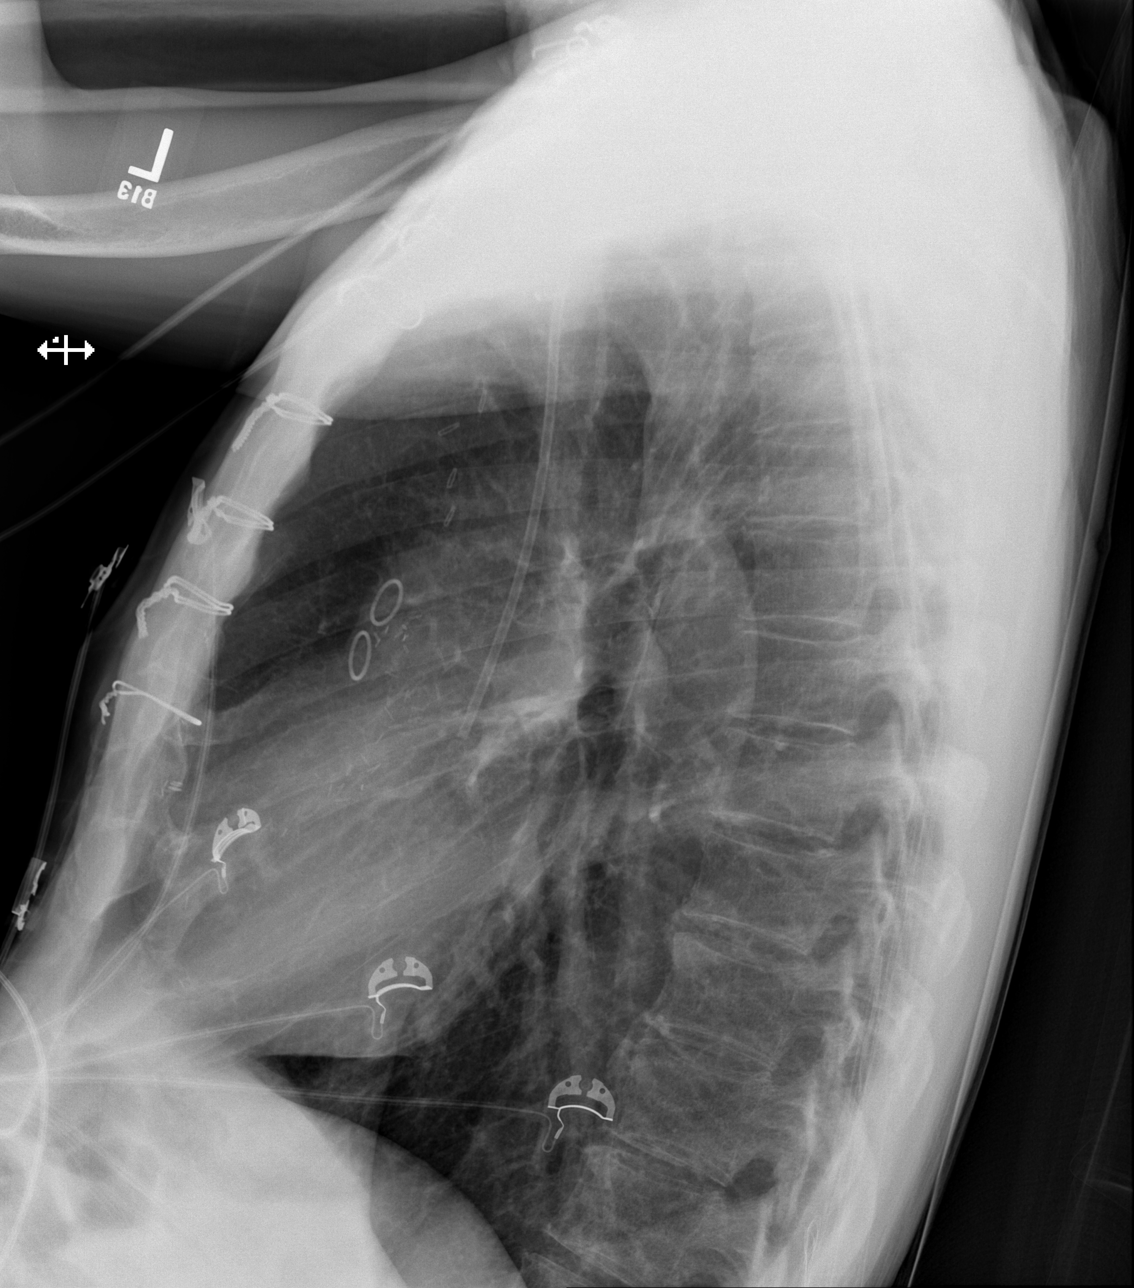

[w chest lat (2 of 2)]
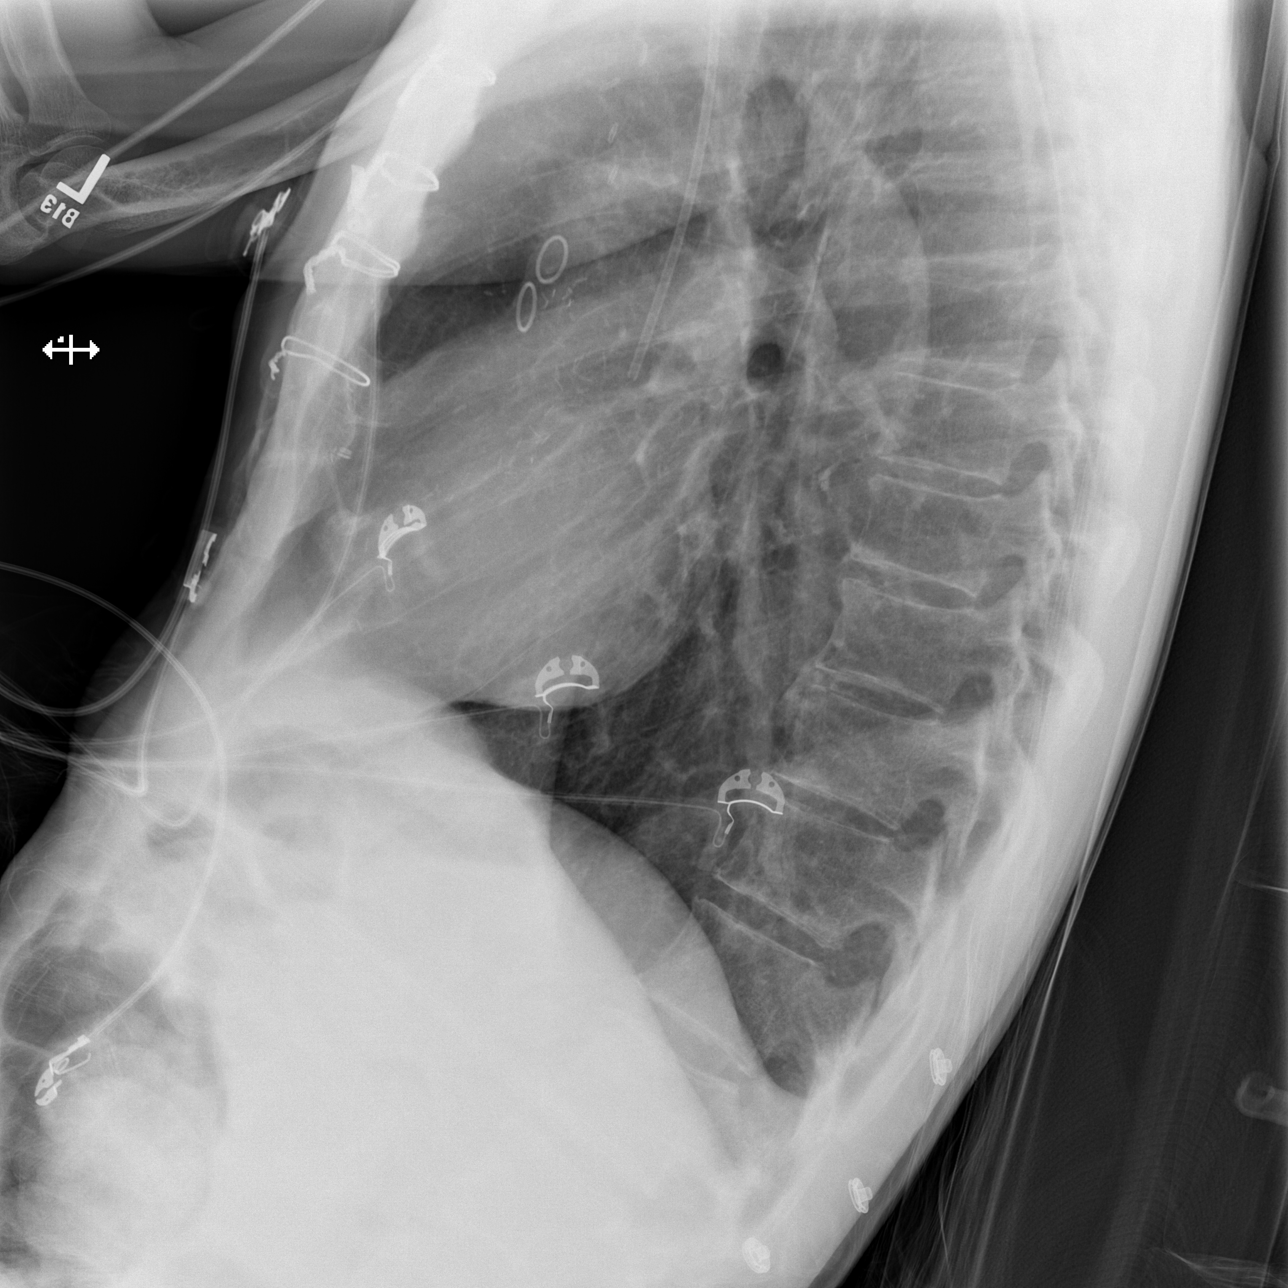

[x chest ap (1 of 2)]
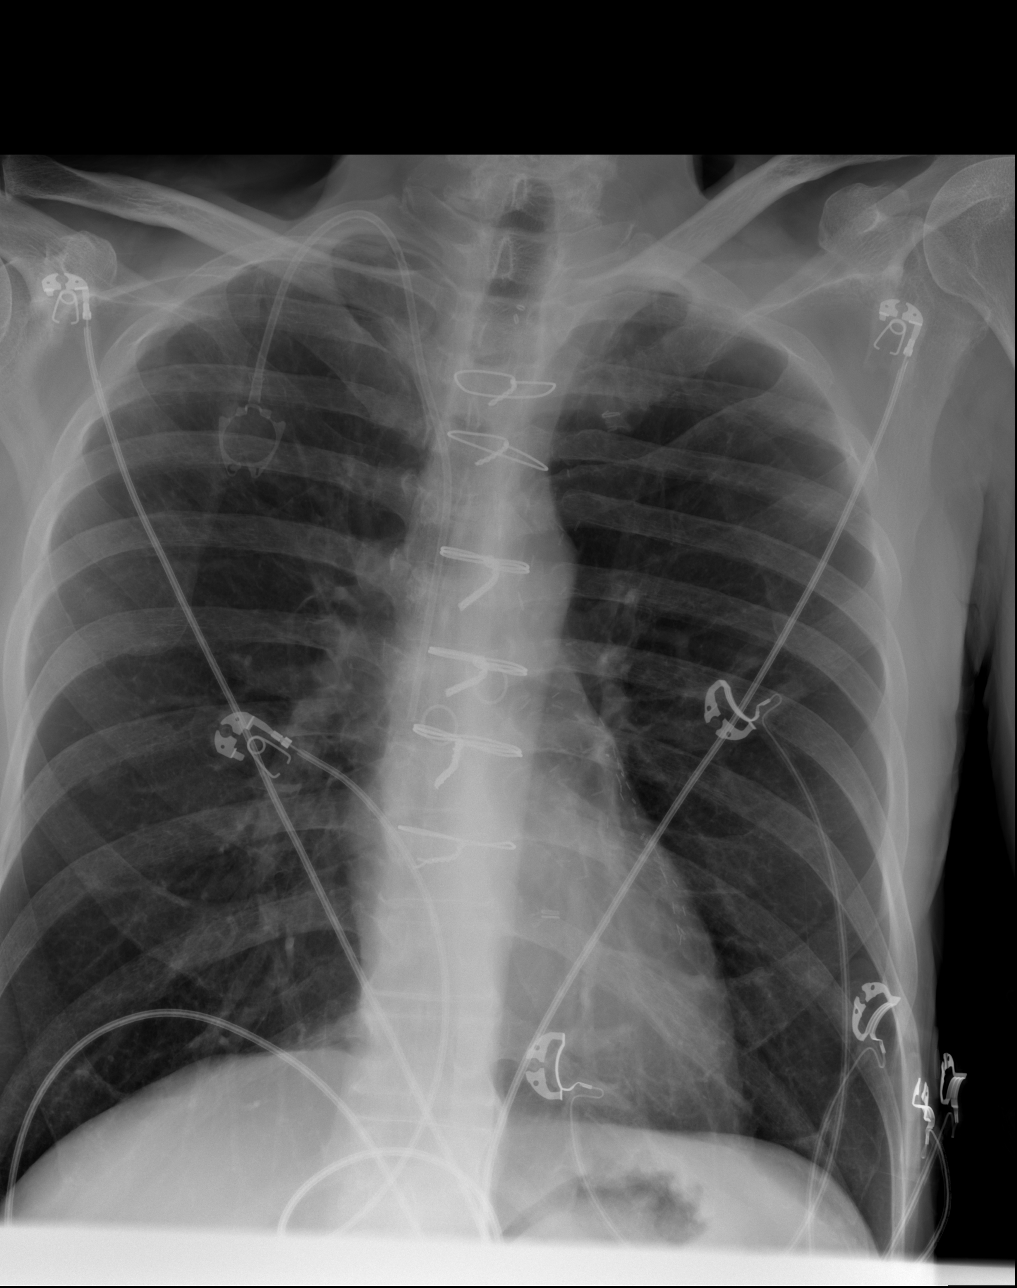

[x chest ap (2 of 2)]
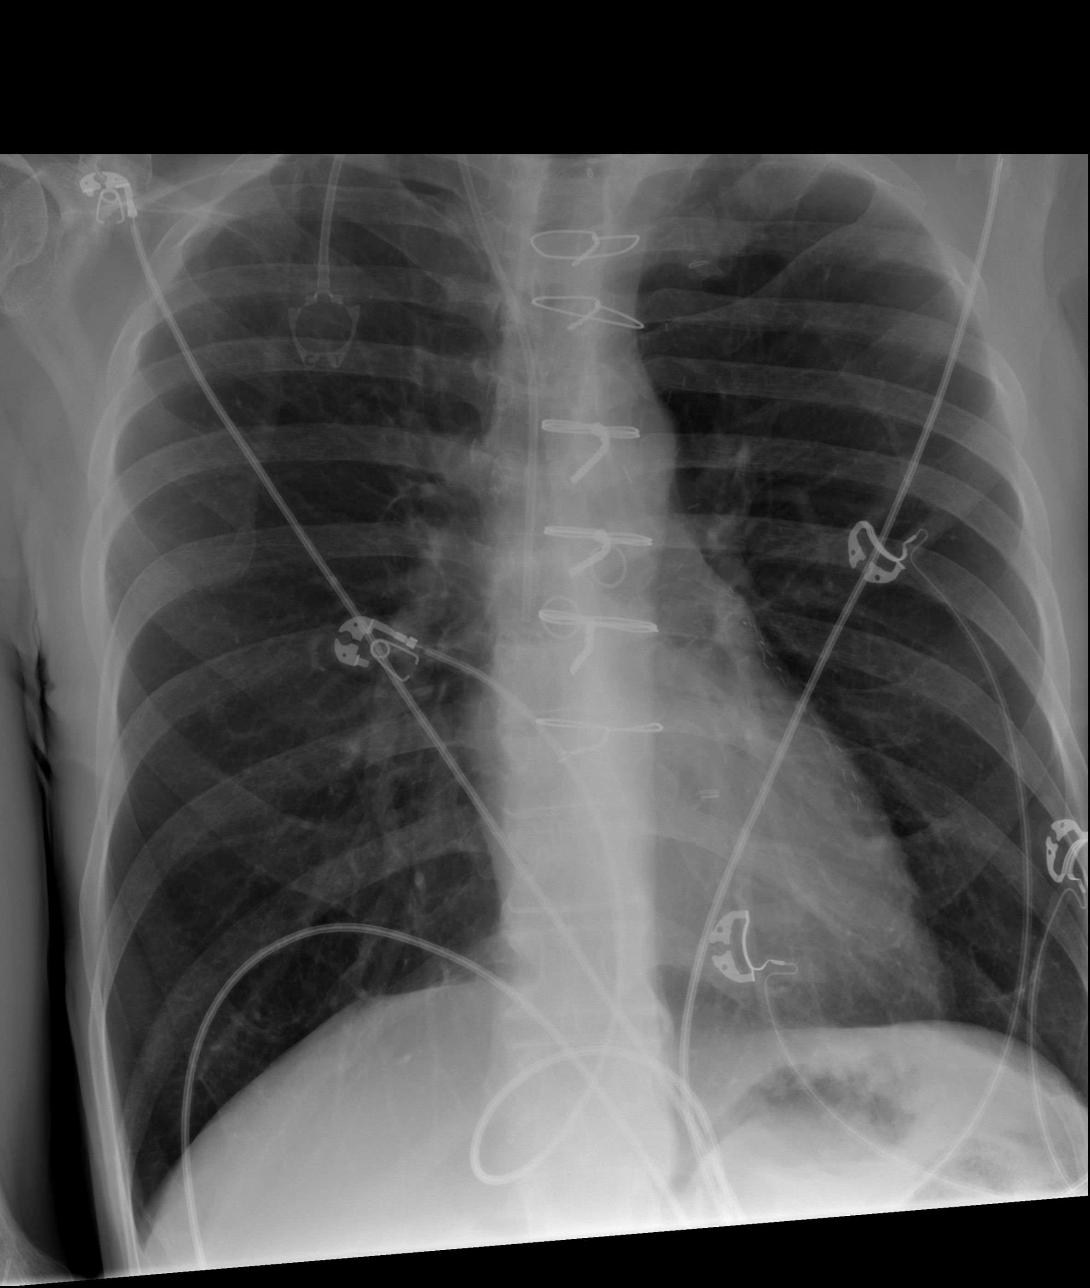

[4 of 4 positions shown; findings below may reference images not displayed]

FINDINGS: The lungs are hyperinflated likely secondary to COPD. There is no
focal parenchymal opacity. There is no pleural effusion or
pneumothorax. The heart and mediastinal contours are unremarkable.
There is a right-sided Port-A-Cath with the tip projecting over the
SVC. There is evidence of prior CABG.

The osseous structures are unremarkable.
IMPRESSION: No active cardiopulmonary disease.

## 2019-04-09 IMAGING — CT CT GUIDANCE TISSUE ABLATION
3 of 6 series · 13 of 32 positions shown, 18 images · non-contrast
Comparison: none

CLINICAL DATA: Unresectable pancreatic carcinoma. Severe pain,
poorly controlled with narcotic pain regimen. See prior
consultation.

[Series 2: i-spiral 5.0 b40f · axial · 0.62mm/px · z∈[+975,+1087]mm · 5 of 50 slices shown, 10 images (1 of 3)]
[im 9/50  soft-tissue]
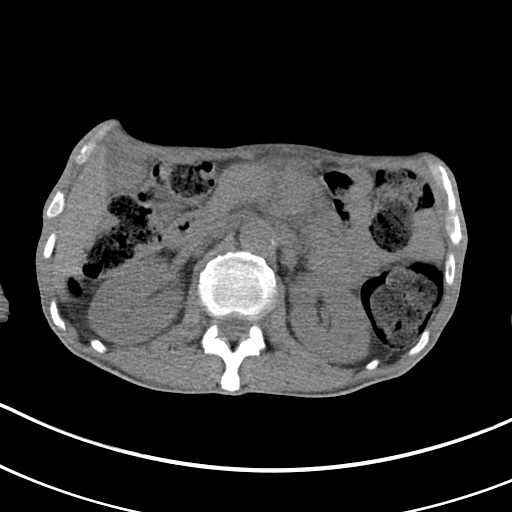
[im 9/50  bone]
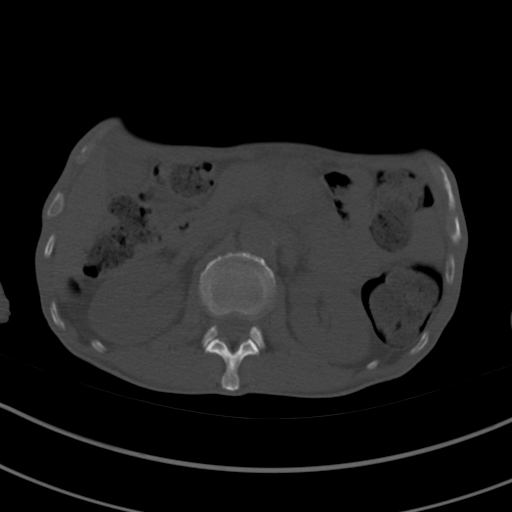
[im 17/50  soft-tissue]
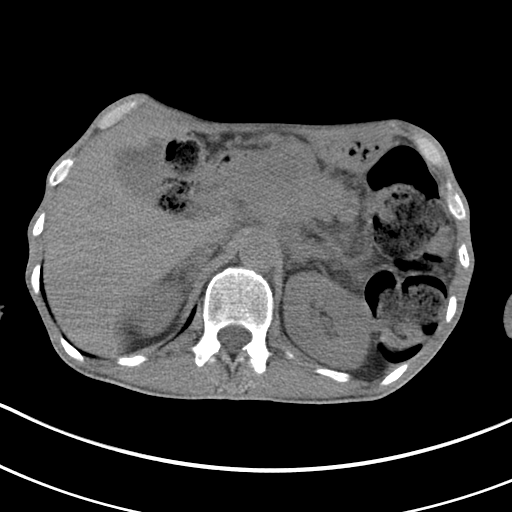
[im 17/50  lung]
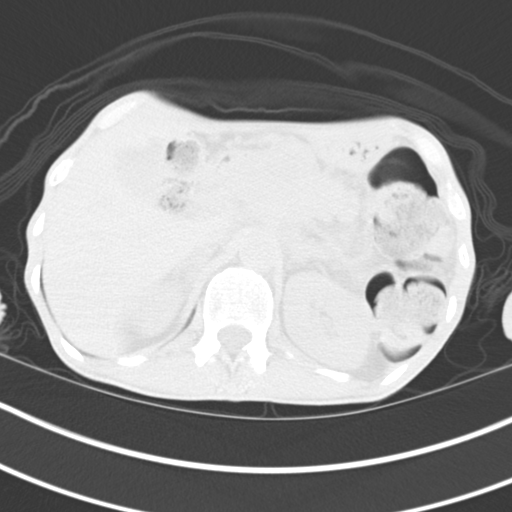
[im 25/50  soft-tissue]
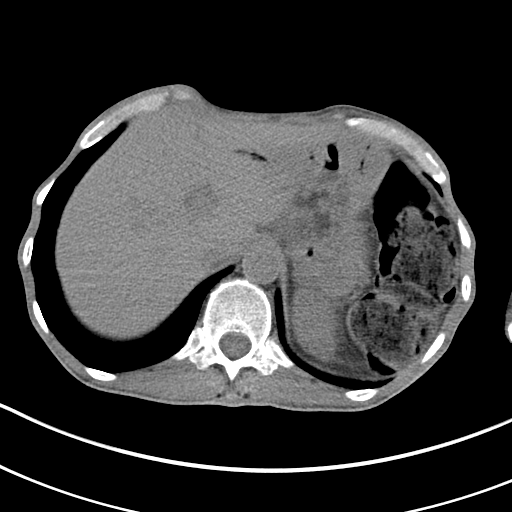
[im 25/50  lung]
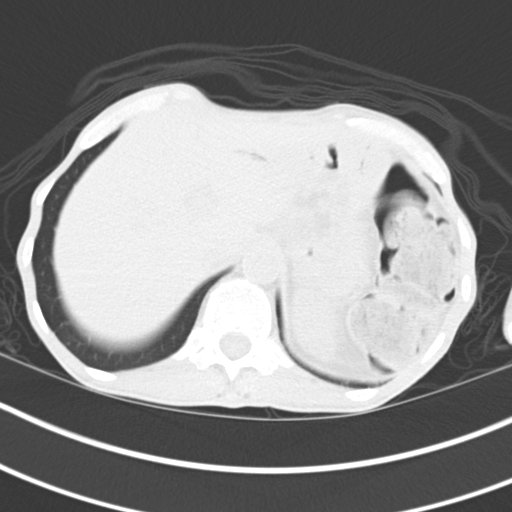
[im 33/50  soft-tissue]
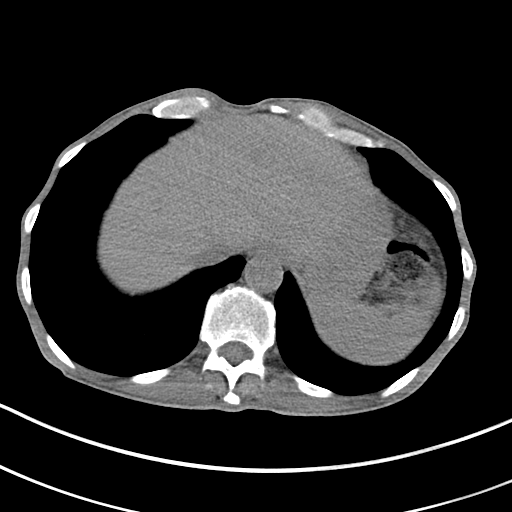
[im 33/50  lung]
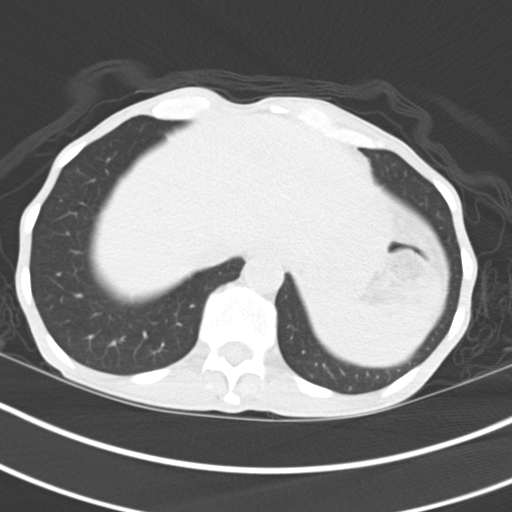
[im 41/50  soft-tissue]
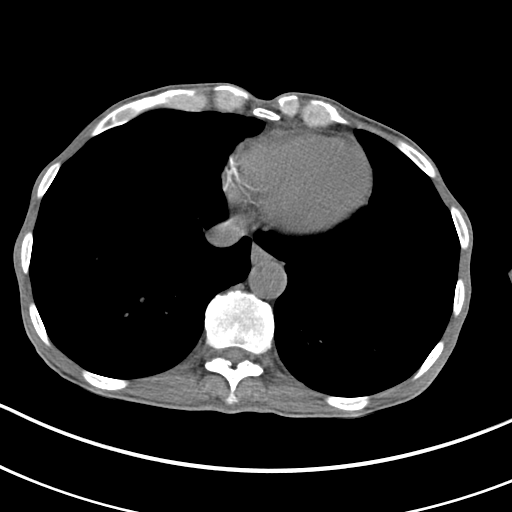
[im 41/50  lung]
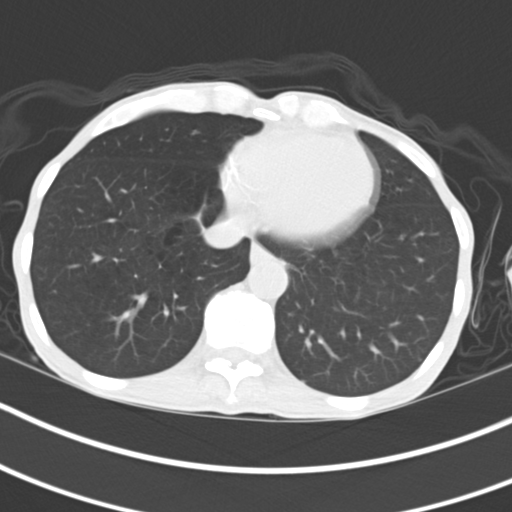

[Series 4: i-spiral 5.0 b40f · axial · 0.62mm/px · z∈[+954,+1060]mm · 4 of 50 slices shown (2 of 3)]
[im 10/50  soft-tissue]
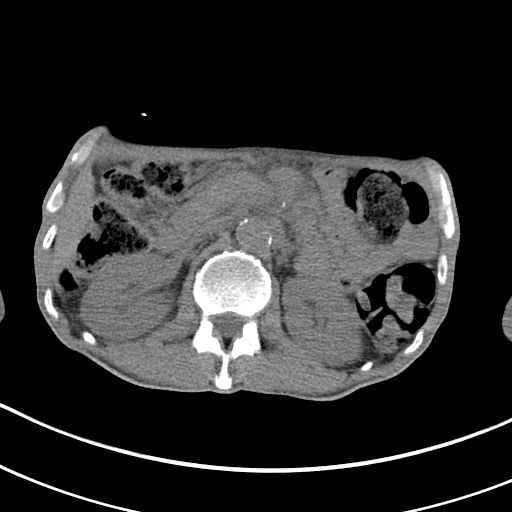
[im 20/50  soft-tissue]
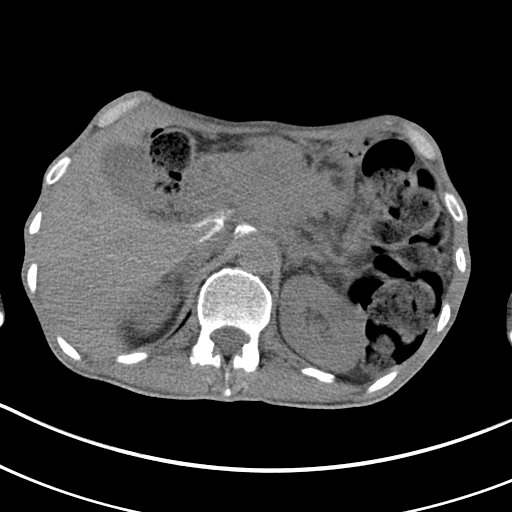
[im 30/50  soft-tissue]
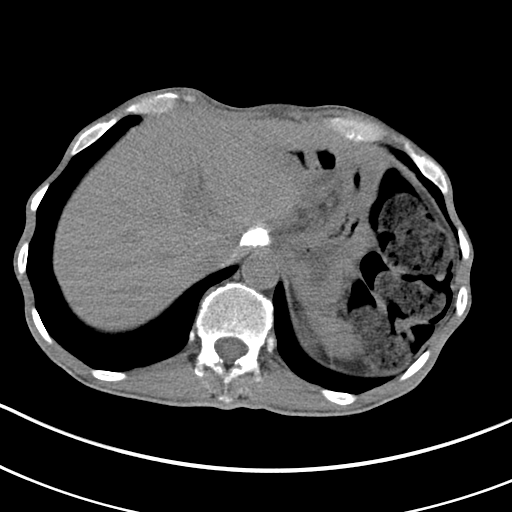
[im 40/50  soft-tissue]
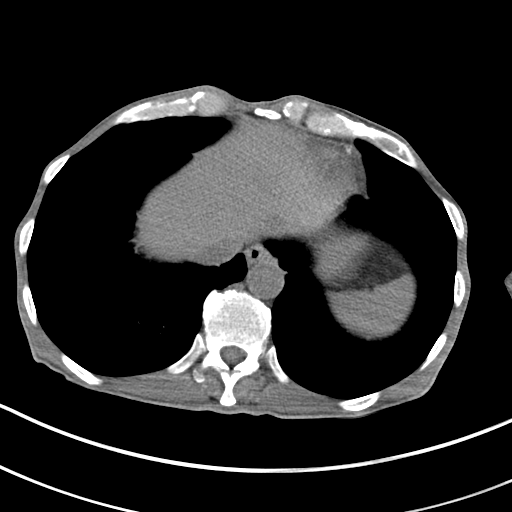

[Series 6: i-spiral 5.0 b40f · axial · 0.62mm/px · z∈[+946,+1050]mm · 4 of 50 slices shown (3 of 3)]
[im 10/50  soft-tissue]
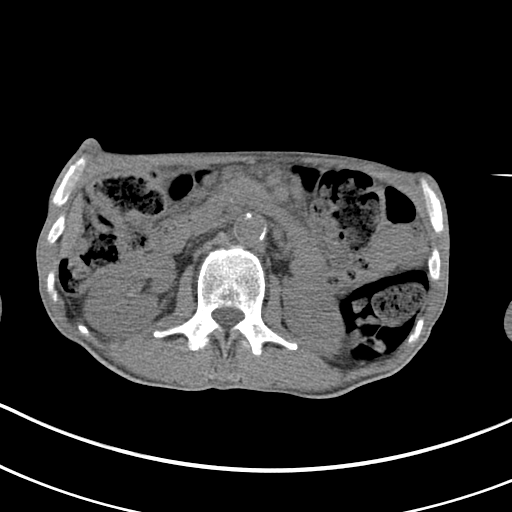
[im 20/50  soft-tissue]
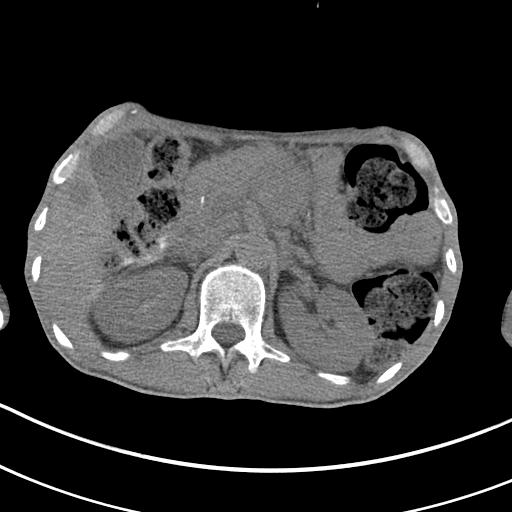
[im 30/50  soft-tissue]
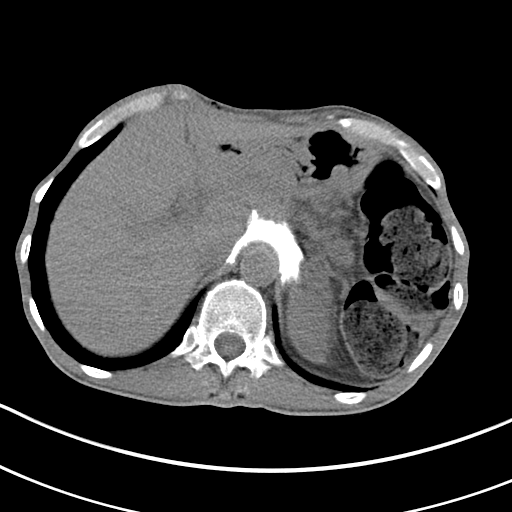
[im 40/50  soft-tissue]
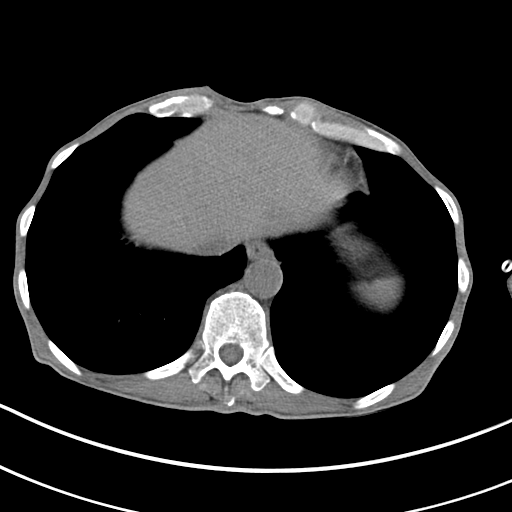

[13 of 32 positions shown; findings below may reference images not displayed]

EXAM:
CT GUIDED NEUROLYTIC ABLATION OF THE CELIAC AXIS

ANESTHESIA/SEDATION:
Intravenous Fentanyl and Versed were administered as conscious
sedation during continuous monitoring of the patient?s level of
consciousness and physiological / cardiorespiratory status by the
radiology RN, with a total moderate sedation time of 43 minutes.

PROCEDURE:
The procedure risks, benefits, and alternatives were explained to
the patient and family. Questions regarding the procedure were
encouraged and answered. The patient understands and consents to the
procedure.

The anterior abdominal wall was prepped with chlorhexidine in a
sterile fashion, and a sterile drape was applied covering the
operative field. A sterile gown and sterile gloves were used for the
procedure. Local anesthesia was provided with 1% Lidocaine.

A 22 gauge Chiba needle was advanced under CT guidance to the level
of the celiac plexus from a right parasagittal approach. After
confirming needle tip position, approximately 5 mL of a [DATE]
dilution of Gmnipaque-5HH contrast and lidocaine 1% was injected.
Spread of diluted contrast material was confirmed by CT, right
greater than left.

With similar technique, a second 22 gauge Chiba needle was advanced
under CT guidance to the level of the celiac plexus from a left
parasagittal approach. After confirming needle tip position,
approximately 5 mL of a [DATE] dilution of Gmnipaque-5HH contrast and
lidocaine 1% was injected. Spread of diluted contrast material was
confirmed by CT, with good bilateral coverage.

Patient was observed for 20 minutes post initial celiac block. No
hypotension or other apparent complications.

Alcohol ablation of the celiac plexus was then performed with
injection of 30 ml of absolute alcohol, divided between the 2 sites.
Needles were removed. Follow-up CT shows good distribution. No
hemorrhage or other apparent complication. The patient tolerated the
procedure well.

COMPLICATIONS:
None
FINDINGS: Contrast injection showed excellent spread across the midline at the
level of the celiac plexus. Alcohol ablation was successfully
performed. The patient tolerated the procedure well.
IMPRESSION: 1. Technically successful CT-guided celiac plexus block and
neurolysis

## 2020-12-16 NOTE — Telephone Encounter (Signed)
This encounter was created in error - please disregard.
